# Patient Record
Sex: Male | Born: 1940 | Race: White | Hispanic: No | Marital: Married | Smoking: Former smoker
Health system: Southern US, Community
[De-identification: ages and names within clinical notes are randomized; demographics above are authoritative.]

## PROBLEM LIST (undated history)

## (undated) DIAGNOSIS — Z87442 Personal history of urinary calculi: Secondary | ICD-10-CM

## (undated) DIAGNOSIS — M25622 Stiffness of left elbow, not elsewhere classified: Secondary | ICD-10-CM

## (undated) DIAGNOSIS — I209 Angina pectoris, unspecified: Secondary | ICD-10-CM

## (undated) DIAGNOSIS — J349 Unspecified disorder of nose and nasal sinuses: Secondary | ICD-10-CM

## (undated) DIAGNOSIS — M539 Dorsopathy, unspecified: Secondary | ICD-10-CM

## (undated) DIAGNOSIS — S0291XA Unspecified fracture of skull, initial encounter for closed fracture: Secondary | ICD-10-CM

## (undated) DIAGNOSIS — R0600 Dyspnea, unspecified: Secondary | ICD-10-CM

## (undated) DIAGNOSIS — K219 Gastro-esophageal reflux disease without esophagitis: Secondary | ICD-10-CM

## (undated) DIAGNOSIS — M898X9 Other specified disorders of bone, unspecified site: Secondary | ICD-10-CM

## (undated) DIAGNOSIS — I10 Essential (primary) hypertension: Secondary | ICD-10-CM

## (undated) DIAGNOSIS — C61 Malignant neoplasm of prostate: Secondary | ICD-10-CM

## (undated) DIAGNOSIS — I509 Heart failure, unspecified: Secondary | ICD-10-CM

## (undated) DIAGNOSIS — N189 Chronic kidney disease, unspecified: Secondary | ICD-10-CM

## (undated) DIAGNOSIS — J189 Pneumonia, unspecified organism: Secondary | ICD-10-CM

## (undated) DIAGNOSIS — K759 Inflammatory liver disease, unspecified: Secondary | ICD-10-CM

## (undated) DIAGNOSIS — H9190 Unspecified hearing loss, unspecified ear: Secondary | ICD-10-CM

## (undated) DIAGNOSIS — J449 Chronic obstructive pulmonary disease, unspecified: Secondary | ICD-10-CM

## (undated) DIAGNOSIS — Z972 Presence of dental prosthetic device (complete) (partial): Secondary | ICD-10-CM

## (undated) DIAGNOSIS — W3400XA Accidental discharge from unspecified firearms or gun, initial encounter: Secondary | ICD-10-CM

## (undated) HISTORY — PX: INSERT / REPLACE / REMOVE PACEMAKER: SUR710

## (undated) HISTORY — PX: BACK SURGERY: SHX140

## (undated) HISTORY — PX: FRACTURE SURGERY: SHX138

## (undated) HISTORY — PX: OTHER SURGICAL HISTORY: SHX169

## (undated) HISTORY — DX: Heart failure, unspecified: I50.9

## (undated) HISTORY — PX: APPENDECTOMY: SHX54

---

## 1977-08-03 HISTORY — PX: BACK SURGERY: SHX140

## 2008-10-04 ENCOUNTER — Emergency Department: Payer: Self-pay | Admitting: Emergency Medicine

## 2008-10-10 ENCOUNTER — Emergency Department: Payer: Self-pay | Admitting: Emergency Medicine

## 2009-05-03 ENCOUNTER — Ambulatory Visit: Payer: Self-pay | Admitting: Orthopedic Surgery

## 2009-05-09 ENCOUNTER — Ambulatory Visit: Payer: Self-pay | Admitting: Orthopedic Surgery

## 2010-01-17 ENCOUNTER — Ambulatory Visit: Payer: Self-pay | Admitting: Cardiology

## 2010-02-06 ENCOUNTER — Ambulatory Visit: Payer: Self-pay | Admitting: Orthopedic Surgery

## 2010-02-13 ENCOUNTER — Ambulatory Visit: Payer: Self-pay | Admitting: Orthopedic Surgery

## 2010-02-17 LAB — PATHOLOGY REPORT

## 2010-09-13 ENCOUNTER — Emergency Department: Payer: Self-pay | Admitting: Internal Medicine

## 2011-01-31 ENCOUNTER — Emergency Department: Payer: Self-pay | Admitting: Unknown Physician Specialty

## 2011-02-02 ENCOUNTER — Ambulatory Visit: Payer: Self-pay | Admitting: Physician Assistant

## 2011-11-03 DIAGNOSIS — M869 Osteomyelitis, unspecified: Secondary | ICD-10-CM | POA: Insufficient documentation

## 2012-08-09 ENCOUNTER — Ambulatory Visit: Payer: Self-pay | Admitting: Pain Medicine

## 2015-12-02 ENCOUNTER — Encounter: Payer: Self-pay | Admitting: Emergency Medicine

## 2015-12-02 ENCOUNTER — Emergency Department
Admission: EM | Admit: 2015-12-02 | Discharge: 2015-12-02 | Disposition: A | Discharge status: Home / Self Care | Payer: Medicare Other | Attending: Emergency Medicine | Admitting: Emergency Medicine

## 2015-12-02 ENCOUNTER — Emergency Department: Payer: Medicare Other

## 2015-12-02 DIAGNOSIS — F17200 Nicotine dependence, unspecified, uncomplicated: Secondary | ICD-10-CM | POA: Diagnosis not present

## 2015-12-02 DIAGNOSIS — J449 Chronic obstructive pulmonary disease, unspecified: Secondary | ICD-10-CM | POA: Insufficient documentation

## 2015-12-02 DIAGNOSIS — R0789 Other chest pain: Secondary | ICD-10-CM

## 2015-12-02 HISTORY — DX: Chronic obstructive pulmonary disease, unspecified: J44.9

## 2015-12-02 LAB — BASIC METABOLIC PANEL
Anion gap: 9 (ref 5–15)
BUN: 13 mg/dL (ref 6–20)
CO2: 26 mmol/L (ref 22–32)
CREATININE: 0.7 mg/dL (ref 0.61–1.24)
Calcium: 9 mg/dL (ref 8.9–10.3)
Chloride: 98 mmol/L — ABNORMAL LOW (ref 101–111)
Glucose, Bld: 96 mg/dL (ref 65–99)
Potassium: 4.1 mmol/L (ref 3.5–5.1)
Sodium: 133 mmol/L — ABNORMAL LOW (ref 135–145)

## 2015-12-02 LAB — CBC
HCT: 41.9 % (ref 40.0–52.0)
Hemoglobin: 14.3 g/dL (ref 13.0–18.0)
MCH: 33 pg (ref 26.0–34.0)
MCHC: 34.1 g/dL (ref 32.0–36.0)
MCV: 96.8 fL (ref 80.0–100.0)
PLATELETS: 287 10*3/uL (ref 150–440)
RBC: 4.33 MIL/uL — AB (ref 4.40–5.90)
RDW: 14.2 % (ref 11.5–14.5)
WBC: 8.5 10*3/uL (ref 3.8–10.6)

## 2015-12-02 LAB — TROPONIN I

## 2015-12-02 MED ORDER — ALBUTEROL SULFATE HFA 108 (90 BASE) MCG/ACT IN AERS: INHALATION_SPRAY | RESPIRATORY_TRACT | Status: DC
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
## 2015-12-02 NOTE — ED Notes (Addendum)
Pt reports centralized chest pain that started 2 days ago; reports dullness. Pt sent here from [facility] for CP eval. Pt reports pain radiates into neck; reports pain upon inspiration.

## 2015-12-02 NOTE — Discharge Instructions (Signed)
You have been seen in the Emergency Department (ED) today for chest pain.  As we have discussed todays test results are normal, but you may require further testing.  Please follow up with the toll-free number listed below in this paperwork to help you establish a primary care doctor.  Continue taking a daily aspirin, either full dose (325 mg) or baby (81 mg).   Return to the Emergency Department (ED) if you experience any further chest pain/pressure/tightness, difficulty breathing, or sudden sweating, or other symptoms that concern you.   Chest Pain (Nonspecific) It is often hard to give a specific diagnosis for the cause of chest pain. There is always a chance that your pain could be related to something serious, such as a heart attack or a blood clot in the lungs. You need to follow up with your health care provider for further evaluation. CAUSES   Heartburn.  Pneumonia or bronchitis.  Anxiety or stress.  Inflammation around your heart (pericarditis) or lung (pleuritis or pleurisy).  A blood clot in the lung.  A collapsed lung (pneumothorax). It can develop suddenly on its own (spontaneous pneumothorax) or from trauma to the chest.  Shingles infection (herpes zoster virus). The chest wall is composed of bones, muscles, and cartilage. Any of these can be the source of the pain.  The bones can be bruised by injury.  The muscles or cartilage can be strained by coughing or overwork.  The cartilage can be affected by inflammation and become sore (costochondritis). DIAGNOSIS  Lab tests or other studies may be needed to find the cause of your pain. Your health care provider may have you take a test called an ambulatory electrocardiogram (ECG). An ECG records your heartbeat patterns over a 24-hour period. You may also have other tests, such as:  Transthoracic echocardiogram (TTE). During echocardiography, sound waves are used to evaluate how blood flows through your  heart.  Transesophageal echocardiogram (TEE).  Cardiac monitoring. This allows your health care provider to monitor your heart rate and rhythm in real time.  Holter monitor. This is a portable device that records your heartbeat and can help diagnose heart arrhythmias. It allows your health care provider to track your heart activity for several days, if needed.  Stress tests by exercise or by giving medicine that makes the heart beat faster. TREATMENT   Treatment depends on what may be causing your chest pain. Treatment may include:  Acid blockers for heartburn.  Anti-inflammatory medicine.  Pain medicine for inflammatory conditions.  Antibiotics if an infection is present.  You may be advised to change lifestyle habits. This includes stopping smoking and avoiding alcohol, caffeine, and chocolate.  You may be advised to keep your head raised (elevated) when sleeping. This reduces the chance of acid going backward from your stomach into your esophagus. Most of the time, nonspecific chest pain will improve within 2-3 days with rest and mild pain medicine.  HOME CARE INSTRUCTIONS   If antibiotics were prescribed, take them as directed. Finish them even if you start to feel better.  For the next few days, avoid physical activities that bring on chest pain. Continue physical activities as directed.  Do not use any tobacco products, including cigarettes, chewing tobacco, or electronic cigarettes.  Avoid drinking alcohol.  Only take medicine as directed by your health care provider.  Follow your health care provider's suggestions for further testing if your chest pain does not go away.  Keep any follow-up appointments you made. If you do not  go to an appointment, you could develop lasting (chronic) problems with pain. If there is any problem keeping an appointment, call to reschedule. SEEK MEDICAL CARE IF:   Your chest pain does not go away, even after treatment.  You have a rash  with blisters on your chest.  You have a fever. SEEK IMMEDIATE MEDICAL CARE IF:   You have increased chest pain or pain that spreads to your arm, neck, jaw, back, or abdomen.  You have shortness of breath.  You have an increasing cough, or you cough up blood.  You have severe back or abdominal pain.  You feel nauseous or vomit.  You have severe weakness.  You faint.  You have chills. This is an emergency. Do not wait to see if the pain will go away. Get medical help at once. Call your local emergency services (911 in [place] Do not drive yourself to the hospital. MAKE SURE YOU:   Understand these instructions.  Will watch your condition.  Will get help right away if you are not doing well or get worse. Document Released: 04/29/2005 Document Revised: 07/25/2013 Document Reviewed: 02/23/2008 [facility] Patient Information 2015 [place], [place]. This information is not intended to replace advice given to you by your health care provider. Make sure you discuss any questions you have with your health care provider.    Chronic Obstructive Pulmonary Disease Exacerbation Chronic obstructive pulmonary disease (COPD) is a common lung condition in which airflow from the lungs is limited. COPD is a general term that can be used to describe many different lung problems that limit airflow, including chronic bronchitis and emphysema. COPD exacerbations are episodes when breathing symptoms become much worse and require extra treatment. Without treatment, COPD exacerbations can be life threatening, and frequent COPD exacerbations can cause further damage to your lungs. CAUSES  Respiratory infections.  Exposure to smoke.  Exposure to air pollution, chemical fumes, or dust. Sometimes there is no apparent cause or trigger. RISK FACTORS  Smoking cigarettes.  Older age.  Frequent prior COPD exacerbations. SIGNS AND SYMPTOMS  Increased coughing.  Increased thick spit (sputum)  production.  Increased wheezing.  Increased shortness of breath.  Rapid breathing.  Chest tightness. DIAGNOSIS Your medical history, a physical exam, and tests will help your health care provider make a diagnosis. Tests may include:  A chest X-ray.  Basic lab tests.  Sputum testing.  An arterial blood gas test. TREATMENT Depending on the severity of your COPD exacerbation, you may need to be admitted to a hospital for treatment. Some of the treatments commonly used to treat COPD exacerbations are:   Antibiotic medicines.  Bronchodilators. These are drugs that expand the air passages. They may be given with an inhaler or nebulizer. Spacer devices may be needed to help improve drug delivery.  Corticosteroid medicines.  Supplemental oxygen therapy.  Airway clearing techniques, such as noninvasive ventilation (NIV) and positive expiratory pressure (PEP). These provide respiratory support through a mask or other noninvasive device. HOME CARE INSTRUCTIONS  Do not smoke. Quitting smoking is very important to prevent COPD from getting worse and exacerbations from happening as often.  Avoid exposure to all substances that irritate the airway, especially to tobacco smoke.  If you were prescribed an antibiotic medicine, finish it all even if you start to feel better.  Take all medicines as directed by your health care provider.It is important to use correct technique with inhaled medicines.  Drink enough fluids to keep your urine clear or pale yellow (unless you  have a medical condition that requires fluid restriction).  Use a cool mist vaporizer. This makes it easier to clear your chest when you cough.  If you have a home nebulizer and oxygen, continue to use them as directed.  Maintain all necessary vaccinations to prevent infections.  Exercise regularly.  Eat a healthy diet.  Keep all follow-up appointments as directed by your health care provider. SEEK IMMEDIATE  MEDICAL CARE IF:  You have worsening shortness of breath.  You have trouble talking.  You have severe chest pain.  You have blood in your sputum.  You have a fever.  You have weakness, vomit repeatedly, or faint.  You feel confused.  You continue to get worse. MAKE SURE YOU:  Understand these instructions.  Will watch your condition.  Will get help right away if you are not doing well or get worse.   This information is not intended to replace advice given to you by your health care provider. Make sure you discuss any questions you have with your health care provider.   Document Released: 05/17/2007 Document Revised: 08/10/2014 Document Reviewed: 03/24/2013 Elsevier Interactive Patient Education [employer].

## 2019-08-04 DIAGNOSIS — I219 Acute myocardial infarction, unspecified: Secondary | ICD-10-CM

## 2019-08-04 HISTORY — DX: Acute myocardial infarction, unspecified: I21.9

## 2019-08-14 ENCOUNTER — Emergency Department: Payer: Medicare Other

## 2019-08-14 ENCOUNTER — Other Ambulatory Visit: Payer: Self-pay

## 2019-08-14 ENCOUNTER — Inpatient Hospital Stay
Admission: EM | Admit: 2019-08-14 | Discharge: 2019-08-17 | DRG: 228 | Disposition: A | Discharge status: Home / Self Care | Payer: Medicare Other | Attending: Internal Medicine | Admitting: Internal Medicine

## 2019-08-14 DIAGNOSIS — E440 Moderate protein-calorie malnutrition: Secondary | ICD-10-CM

## 2019-08-14 DIAGNOSIS — Z888 Allergy status to other drugs, medicaments and biological substances status: Secondary | ICD-10-CM | POA: Diagnosis not present

## 2019-08-14 DIAGNOSIS — J441 Chronic obstructive pulmonary disease with (acute) exacerbation: Secondary | ICD-10-CM

## 2019-08-14 DIAGNOSIS — R0600 Dyspnea, unspecified: Secondary | ICD-10-CM

## 2019-08-14 DIAGNOSIS — R079 Chest pain, unspecified: Secondary | ICD-10-CM | POA: Diagnosis not present

## 2019-08-14 DIAGNOSIS — D72829 Elevated white blood cell count, unspecified: Secondary | ICD-10-CM | POA: Diagnosis present

## 2019-08-14 DIAGNOSIS — Z681 Body mass index (BMI) 19 or less, adult: Secondary | ICD-10-CM | POA: Diagnosis not present

## 2019-08-14 DIAGNOSIS — E43 Unspecified severe protein-calorie malnutrition: Secondary | ICD-10-CM | POA: Insufficient documentation

## 2019-08-14 DIAGNOSIS — F17200 Nicotine dependence, unspecified, uncomplicated: Secondary | ICD-10-CM

## 2019-08-14 DIAGNOSIS — Z716 Tobacco abuse counseling: Secondary | ICD-10-CM | POA: Diagnosis not present

## 2019-08-14 DIAGNOSIS — Z79899 Other long term (current) drug therapy: Secondary | ICD-10-CM | POA: Diagnosis not present

## 2019-08-14 DIAGNOSIS — R739 Hyperglycemia, unspecified: Secondary | ICD-10-CM | POA: Diagnosis not present

## 2019-08-14 DIAGNOSIS — F17210 Nicotine dependence, cigarettes, uncomplicated: Secondary | ICD-10-CM | POA: Diagnosis present

## 2019-08-14 DIAGNOSIS — T380X5A Adverse effect of glucocorticoids and synthetic analogues, initial encounter: Secondary | ICD-10-CM | POA: Diagnosis not present

## 2019-08-14 DIAGNOSIS — I442 Atrioventricular block, complete: Secondary | ICD-10-CM

## 2019-08-14 DIAGNOSIS — Z20822 Contact with and (suspected) exposure to covid-19: Secondary | ICD-10-CM | POA: Diagnosis present

## 2019-08-14 DIAGNOSIS — N179 Acute kidney failure, unspecified: Secondary | ICD-10-CM | POA: Diagnosis present

## 2019-08-14 LAB — CBC
HCT: 36.4 % — ABNORMAL LOW (ref 39.0–52.0)
Hemoglobin: 12.2 g/dL — ABNORMAL LOW (ref 13.0–17.0)
MCH: 33 pg (ref 26.0–34.0)
MCHC: 33.5 g/dL (ref 30.0–36.0)
MCV: 98.4 fL (ref 80.0–100.0)
Platelets: 218 10*3/uL (ref 150–400)
RBC: 3.7 MIL/uL — ABNORMAL LOW (ref 4.22–5.81)
RDW: 13.7 % (ref 11.5–15.5)
WBC: 9.3 10*3/uL (ref 4.0–10.5)
nRBC: 0 % (ref 0.0–0.2)

## 2019-08-14 LAB — BASIC METABOLIC PANEL
Anion gap: 6 (ref 5–15)
BUN: 12 mg/dL (ref 8–23)
CO2: 28 mmol/L (ref 22–32)
Calcium: 8.7 mg/dL — ABNORMAL LOW (ref 8.9–10.3)
Chloride: 102 mmol/L (ref 98–111)
Creatinine, Ser: 0.77 mg/dL (ref 0.61–1.24)
GFR calc Af Amer: >60 mL/min (ref >60)
GFR calc non Af Amer: >60 mL/min (ref >60)
Glucose, Bld: 82 mg/dL (ref 70–99)
Potassium: 4.2 mmol/L (ref 3.5–5.1)
Sodium: 136 mmol/L (ref 135–145)

## 2019-08-14 LAB — MAGNESIUM: Magnesium: 2 mg/dL (ref 1.7–2.4)

## 2019-08-14 LAB — POC SARS CORONAVIRUS 2 AG: SARS Coronavirus 2 Ag: NEGATIVE

## 2019-08-14 LAB — RESPIRATORY PANEL BY RT PCR (FLU A&B, COVID)
Influenza A by PCR: NEGATIVE
Influenza B by PCR: NEGATIVE
SARS Coronavirus 2 by RT PCR: NEGATIVE

## 2019-08-14 LAB — TROPONIN I (HIGH SENSITIVITY)
Troponin I (High Sensitivity): 14 ng/L (ref <18)
Troponin I (High Sensitivity): 17 ng/L (ref <18)

## 2019-08-14 MED ORDER — ENOXAPARIN SODIUM 40 MG/0.4ML ~~LOC~~ SOLN: 40.0000 mg | SUBCUTANEOUS | Status: DC

## 2019-08-14 MED ORDER — AZITHROMYCIN 500 MG PO TABS
500.0000 mg | ORAL_TABLET | Freq: Every day | ORAL | Status: DC
  Administered 2019-08-15 – 2019-08-16 (×2): 500 mg via ORAL
  Filled 2019-08-14 (×4): qty 1

## 2019-08-14 MED ORDER — ALBUTEROL SULFATE (2.5 MG/3ML) 0.083% IN NEBU: 2.5000 mg | INHALATION_SOLUTION | RESPIRATORY_TRACT | Status: DC | PRN

## 2019-08-14 MED ORDER — PREDNISONE 10 MG PO TABS
40.0000 mg | ORAL_TABLET | Freq: Every day | ORAL | Status: DC
  Administered 2019-08-17: 40 mg via ORAL
  Filled 2019-08-14 (×2): qty 4

## 2019-08-14 MED ORDER — METHYLPREDNISOLONE SODIUM SUCC 40 MG IJ SOLR
40.0000 mg | Freq: Two times a day (BID) | INTRAMUSCULAR | Status: AC
  Administered 2019-08-15 (×2): 40 mg via INTRAVENOUS
  Filled 2019-08-14 (×2): qty 1

## 2019-08-14 MED ORDER — IPRATROPIUM-ALBUTEROL 0.5-2.5 (3) MG/3ML IN SOLN: 3.0000 mL | Freq: Once | RESPIRATORY_TRACT | Status: AC

## 2019-08-14 MED ORDER — IPRATROPIUM-ALBUTEROL 0.5-2.5 (3) MG/3ML IN SOLN
3.0000 mL | Freq: Once | RESPIRATORY_TRACT | Status: AC
  Administered 2019-08-14: 20:00:00 3 mL via RESPIRATORY_TRACT

## 2019-08-14 MED ORDER — IPRATROPIUM-ALBUTEROL 0.5-2.5 (3) MG/3ML IN SOLN
RESPIRATORY_TRACT | Status: AC
  Administered 2019-08-14: 19:00:00 3 mL via RESPIRATORY_TRACT
  Filled 2019-08-14: qty 6

## 2019-08-14 MED ORDER — NICOTINE 21 MG/24HR TD PT24
21.0000 mg | MEDICATED_PATCH | Freq: Every day | TRANSDERMAL | Status: DC
  Filled 2019-08-14: qty 1

## 2019-08-14 MED ORDER — METHYLPREDNISOLONE SODIUM SUCC 125 MG IJ SOLR
125.0000 mg | Freq: Once | INTRAMUSCULAR | Status: AC
  Administered 2019-08-14: 19:00:00 125 mg via INTRAVENOUS
  Filled 2019-08-14: qty 2

## 2019-08-14 MED ORDER — IPRATROPIUM-ALBUTEROL 0.5-2.5 (3) MG/3ML IN SOLN
3.0000 mL | Freq: Four times a day (QID) | RESPIRATORY_TRACT | Status: DC
  Administered 2019-08-14 – 2019-08-17 (×9): 3 mL via RESPIRATORY_TRACT
  Filled 2019-08-14 (×9): qty 3

## 2019-08-14 MED ORDER — SODIUM CHLORIDE 0.9 % IV SOLN
500.0000 mg | INTRAVENOUS | Status: AC
  Administered 2019-08-14: 500 mg via INTRAVENOUS
  Filled 2019-08-14: qty 500

## 2019-08-14 NOTE — ED Notes (Signed)
Zoll pads placed on patient and code cart placed at bedside per Day.

## 2019-08-14 NOTE — ED Provider Notes (Signed)
[facility] Emergency Department Provider Note  Time seen: 6:44 PM  I have reviewed the triage vital signs and the nursing notes.   HISTORY  Chief Complaint Shortness of Breath   HPI Todd Webster is a 79 y.o. male with a past medical history of COPD presents to the emergency department for shortness of breath.  Patient states he has been diagnosed with COPD but takes no nebulizer treatments, and does not use oxygen at home, continues to smoke.  Patient states last night while walking to the bathroom he became short of breath and had a mild pain in his chest.  States he has continued to feel somewhat short of breath today feeling like he needs to take deep breaths to catch his breath.  Denies any chest pain at this time.  Denies any fever increased cough or congestion.  No Covid exposure.  Overall the patient appears quite well currently, no acute distress, 99 to 100% room air saturation.  Speaking in full and complete sentences.   Past Medical History:  Diagnosis Date  . COPD (chronic obstructive pulmonary disease) (HCC)     There are no problems to display for this patient.   Past Surgical History:  Procedure Laterality Date  . APPENDECTOMY      Prior to Admission medications   Medication Sig Start Date End Date Taking? Authorizing Provider  albuterol (PROVENTIL HFA;VENTOLIN HFA) 108 (90 Base) MCG/ACT inhaler Inhale 4-6 puffs by mouth every 4 hours as needed for wheezing, cough, and/or shortness of breath 12/02/15   Hinda Kehr, MD    Allergies  Allergen Reactions  . Grifulvin V [Griseofulvin]     No family history on file.  Social History Social History   Tobacco Use  . Smoking status: Current Every Day Smoker  Substance Use Topics  . Alcohol use: Not on file  . Drug use: Not on file    Review of Systems Constitutional: Negative for fever. ENT: Negative for recent illness/congestion Cardiovascular: Mild chest pain last night which has  since resolved. Respiratory: Positive for shortness of breath. Gastrointestinal: Negative for abdominal pain, vomiting  Musculoskeletal: Negative for leg swelling. Neurological: Negative for headache All other ROS negative  ____________________________________________   PHYSICAL EXAM:  VITAL SIGNS: ED Triage Vitals [08/14/19 1628]  Enc Vitals Group     BP (!) 124/57     Pulse Rate (!) 43     Resp 20     Temp 98.1 F (36.7 C)     Temp Source Oral     SpO2 100 %     Weight 130 lb (59 kg)     Height 6\' 1"  (1.854 m)     Head Circumference      Peak Flow      Pain Score 0     Pain Loc      Pain Edu?      Excl. in [place]?    Constitutional: Alert and oriented. Well appearing and in no distress. Eyes: Normal exam ENT      Head: Normocephalic and atraumatic      Mouth/Throat: Mucous membranes are moist. Cardiovascular: Normal rate, regular rhythm. No murmur Respiratory: Normal respiratory effort without tachypnea nor retractions.  Mild expiratory wheeze bilaterally.  No rales or rhonchi. Gastrointestinal: Soft and nontender. No distention. Musculoskeletal: Nontender with normal range of motion in all extremities. No lower extremity tenderness or edema. Neurologic:  Normal speech and language. No gross focal neurologic deficits Skin:  Skin is warm, dry  and intact.  Psychiatric: Mood and affect are normal.   ____________________________________________    EKG  EKG viewed and interpreted by myself shows sinus bradycardia 76 bpm with a narrow QRS, normal axis, normal intervals, nonspecific ST changes.  No ST elevations.  Does appear to have additional P waves versus U waves.  ____________________________________________    RADIOLOGY  X-ray consistent with COPD no acute infiltrates.  ____________________________________________   INITIAL IMPRESSION / ASSESSMENT AND PLAN / ED COURSE  Pertinent labs & imaging results that were available during my care of the patient were  reviewed by me and considered in my medical decision making (see chart for details).   Patient presents emergency department for shortness of breath, worse last night.  Currently appears well satting 1% on room air speaking full and complete sentences.  No chest pain currently.  Patient's labs thus far reassuring including a negative troponin.  We will repeat a troponin as a precaution.  We will treat with Solu-Medrol.  We will perform a rapid Covid swab.  If the patient's Covid test is negative we will treat with duo nebs in the emergency department.  Patient's presentation is suspicious for COPD exacerbation.  Lab work is normal including negative troponin.  Covid test is negative.  Chest x-ray shows COPD but no acute infiltrates.  However patient's heart rate is now sustained around 42 bpm.  I printed a rhythm strip and it appears that the patient is in complete heart block, P waves are not correlating with QRS.  This would explain the patient's shortness of breath especially with any type of exertion.  We will repeat an EKG and I will discussed with cardiology for further recommendations.  Patient will likely require admission.  Spoke with cardiology we will admit the patient to the hospital service.  Cardiology will see.  We will place the patient on zoll monitor as a precaution but does not require external pacing at this time.  Todd Webster was evaluated in Emergency Department on 08/14/2019 for the symptoms described in the history of present illness. He was evaluated in the context of the global COVID-19 pandemic, which necessitated consideration that the patient might be at risk for infection with the SARS-CoV-2 virus that causes COVID-19. Institutional protocols and algorithms that pertain to the evaluation of patients at risk for COVID-19 are in a state of rapid change based on information released by regulatory bodies including the CDC and federal and state organizations. These policies and  algorithms were followed during the patient's care in the ED.  ____________________________________________   FINAL CLINICAL IMPRESSION(S) / ED DIAGNOSES  Complete heart block Dyspnea COPD exacerbation   Harvest Dark, MD 08/14/19 2011

## 2019-08-14 NOTE — ED Notes (Signed)
While ambulating pt had increased WOB but oxygen saturation did not drop below 94% with a good waveform on RA. Pt needed to sit in the bed for a minute before WOB returned to normal and pt could speak in complete sentences.

## 2019-08-14 NOTE — ED Notes (Signed)
ED Provider at bedside. 

## 2019-08-14 NOTE — ED Notes (Signed)
Pt given phone to speak with family  
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
## 2019-08-14 NOTE — ED Triage Notes (Signed)
Pt to ER via POV c/o increase in SOB over his baseline beginning last night. Pt hx of COPD but states SOB worsening progressively. Does not wear oxygen at home. Soreness to chest that lasted a few minutes last night, no current CP. Pt alert and oriented X4, cooperative, RR even and unlabored, color WNL. Pt in NAD. Able to speak in full and complete sentences without difficulty.

## 2019-08-14 NOTE — Consult Note (Signed)
Chart reviewed and EKG, patient hemodynamicaly stable, will hold enoxazprin am , for prceedure.

## 2019-08-15 ENCOUNTER — Inpatient Hospital Stay
Admit: 2019-08-15 | Discharge: 2019-08-15 | Disposition: A | Discharge status: Home / Self Care | Payer: Medicare Other | Attending: Internal Medicine | Admitting: Internal Medicine

## 2019-08-15 DIAGNOSIS — E440 Moderate protein-calorie malnutrition: Secondary | ICD-10-CM

## 2019-08-15 DIAGNOSIS — E43 Unspecified severe protein-calorie malnutrition: Secondary | ICD-10-CM | POA: Insufficient documentation

## 2019-08-15 DIAGNOSIS — E8809 Other disorders of plasma-protein metabolism, not elsewhere classified: Secondary | ICD-10-CM | POA: Insufficient documentation

## 2019-08-15 LAB — ECHOCARDIOGRAM COMPLETE
Height: 73 in
Weight: 2080 oz

## 2019-08-15 LAB — BASIC METABOLIC PANEL
Anion gap: 10 (ref 5–15)
BUN: 16 mg/dL (ref 8–23)
CO2: 22 mmol/L (ref 22–32)
Calcium: 8.9 mg/dL (ref 8.9–10.3)
Chloride: 101 mmol/L (ref 98–111)
Creatinine, Ser: 0.97 mg/dL (ref 0.61–1.24)
GFR calc Af Amer: >60 mL/min (ref >60)
GFR calc non Af Amer: >60 mL/min (ref >60)
Glucose, Bld: 215 mg/dL — ABNORMAL HIGH (ref 70–99)
Potassium: 3.8 mmol/L (ref 3.5–5.1)
Sodium: 133 mmol/L — ABNORMAL LOW (ref 135–145)

## 2019-08-15 LAB — GLUCOSE, CAPILLARY: Glucose-Capillary: 194 mg/dL — ABNORMAL HIGH (ref 70–99)

## 2019-08-15 LAB — CBC
HCT: 33.9 % — ABNORMAL LOW (ref 39.0–52.0)
Hemoglobin: 11.3 g/dL — ABNORMAL LOW (ref 13.0–17.0)
MCH: 32.6 pg (ref 26.0–34.0)
MCHC: 33.3 g/dL (ref 30.0–36.0)
MCV: 97.7 fL (ref 80.0–100.0)
Platelets: 189 10*3/uL (ref 150–400)
RBC: 3.47 MIL/uL — ABNORMAL LOW (ref 4.22–5.81)
RDW: 13.8 % (ref 11.5–15.5)
WBC: 5.2 10*3/uL (ref 4.0–10.5)
nRBC: 0 % (ref 0.0–0.2)

## 2019-08-15 LAB — TSH: TSH: 1.568 u[IU]/mL (ref 0.350–4.500)

## 2019-08-15 LAB — HIV ANTIBODY (ROUTINE TESTING W REFLEX): HIV Screen 4th Generation wRfx: NONREACTIVE

## 2019-08-15 LAB — MRSA PCR SCREENING: MRSA by PCR: NEGATIVE

## 2019-08-15 LAB — MAGNESIUM: Magnesium: 1.9 mg/dL (ref 1.7–2.4)

## 2019-08-15 MED ORDER — ADULT MULTIVITAMIN W/MINERALS CH
1.0000 | ORAL_TABLET | Freq: Every day | ORAL | Status: DC
  Administered 2019-08-17: 1 via ORAL
  Filled 2019-08-15: qty 1

## 2019-08-15 MED ORDER — ACETAMINOPHEN 325 MG PO TABS: 325.0000 mg | ORAL_TABLET | ORAL | Status: DC | PRN

## 2019-08-15 MED ORDER — ACETAMINOPHEN 325 MG PO TABS
ORAL_TABLET | ORAL | Status: AC
  Filled 2019-08-15: qty 2

## 2019-08-15 MED ORDER — CHLORHEXIDINE GLUCONATE CLOTH 2 % EX PADS
6.0000 | MEDICATED_PAD | Freq: Every day | CUTANEOUS | Status: DC
  Administered 2019-08-15 – 2019-08-16 (×2): 6 via TOPICAL
  Filled 2019-08-15: qty 6

## 2019-08-15 MED ORDER — SENNOSIDES-DOCUSATE SODIUM 8.6-50 MG PO TABS: 2.0000 | ORAL_TABLET | Freq: Every evening | ORAL | Status: DC | PRN

## 2019-08-15 MED ORDER — BUDESONIDE 0.5 MG/2ML IN SUSP
0.5000 mg | Freq: Two times a day (BID) | RESPIRATORY_TRACT | Status: DC
  Administered 2019-08-15 – 2019-08-17 (×4): 0.5 mg via RESPIRATORY_TRACT
  Filled 2019-08-15 (×4): qty 2

## 2019-08-15 MED ORDER — ENSURE ENLIVE PO LIQD
237.0000 mL | Freq: Three times a day (TID) | ORAL | Status: DC
  Administered 2019-08-15 – 2019-08-17 (×4): 237 mL via ORAL

## 2019-08-15 MED ORDER — ACETAMINOPHEN 325 MG PO TABS
650.0000 mg | ORAL_TABLET | Freq: Four times a day (QID) | ORAL | Status: DC | PRN
  Administered 2019-08-15 (×2): 650 mg via ORAL
  Filled 2019-08-15: qty 2

## 2019-08-15 MED ORDER — POLYETHYLENE GLYCOL 3350 17 G PO PACK: 17.0000 g | PACK | Freq: Every day | ORAL | Status: DC | PRN
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
## 2019-08-15 NOTE — Progress Notes (Signed)
PT Cancellation Note  Patient Details Name: Todd NITCHMAN Sr. MRN: [id number] DOB: 11-16-1940   Cancelled Treatment:    Reason Eval/Treat Not Completed: Patient not medically ready;Medical issues which prohibited therapy. PT consult received and chart reviewed. Upon review and speaking with RN, pt with HR of 35 bpm this AM, pt to have PPM placed today. Will hold PT evaluation for later date/time if appropriate. Thank you.  Zachary George PT, [place] 9:24 AM,08/15/19 [phone]    Tamey Wanek Drucilla Chalet 08/15/2019, 9:24 AM

## 2019-08-15 NOTE — Progress Notes (Signed)
OT Cancellation Note  Patient Details Name: Todd SALZMAN Sr. MRN: [id number] DOB: 1941/03/28   Cancelled Treatment:    Reason Eval/Treat Not Completed: Medical issues which prohibited therapy  OT consult received and chart reviewed. Upon review and speaking with RN, pt with HR of 35 bpm this AM (0800 most recent reading), pt to have PPM placed today. Will hold occupational therapy evaluation until after procedure if appropriate. Thank you.   Gerrianne Scale, [place], OTR/L ascom [phone] 08/15/19, 9:20 AM

## 2019-08-15 NOTE — Progress Notes (Signed)
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  GOAL:   Patient will meet greater than or equal to 90% of their needs  MONITOR:   PO intake, Supplement acceptance, Labs, Weight trends, Skin, I & O's  REASON FOR ASSESSMENT:   Consult Assessment of nutrition requirement/status  ASSESSMENT:   79 y/o male with h/o COPD admitted with chest pain secondary to third-degree heart block   Met with pt in room today. Pt reports good appetite and oral intake at baseline. Pt reports that lunch today was the first meal he was offered since he was admitted. Pt ate a sandwich and some milk (likes whole). Pt reports that he loves milkshakes and he drinks these a lot at home. Pt does not drink supplements but is willing to try Ensure while in hospital. RD will add supplements and MVI to help pt meet his estimated needs. RD will also change pt to a dysphagia 3 diet as pt with poor dentition. There is not a very detailed weight history in chart to determine if any significant recent weight loss. Pt has one documented weight from 2019 of 139lbs. Pt reports that his weight is stable.   Medications reviewed and include: azithromycin, solu-medrol, nicotine  Labs reviewed: Na 133(L), Mg 1.9 wnl  NUTRITION - FOCUSED PHYSICAL EXAM:    Most Recent Value  Orbital Region  Moderate depletion  Upper Arm Region  Severe depletion  Thoracic and Lumbar Region  Severe depletion  Buccal Region  Moderate depletion  Temple Region  Severe depletion  Clavicle Bone Region  Severe depletion  Clavicle and  Acromion Bone Region  Severe depletion  Scapular Bone Region  Severe depletion  Dorsal Hand  Severe depletion  Patellar Region  Severe depletion  Anterior Thigh Region  Severe depletion  Posterior Calf Region  Severe depletion  Edema (RD Assessment)  None  Hair  Reviewed  Eyes  Reviewed  Mouth  Reviewed  Skin  Reviewed  Nails  Reviewed     Diet Order:   Diet Order            DIET DYS 3 Room service appropriate? Yes; Fluid consistency: Thin  Diet effective now             EDUCATION NEEDS:   Education needs have been addressed  Skin:  Skin Assessment: Reviewed RN Assessment  Last BM:  pta  Height:   Ht Readings from Last 1 Encounters:  08/14/19 '6\' 1"'  (1.854 m)    Weight:   Wt Readings from Last 1 Encounters:  08/14/19 59 kg    Ideal Body Weight:  83.6 kg  BMI:  Body mass index is 17.15 kg/m.  Estimated Nutritional Needs:   Kcal:  1800-2100kcal/day  Protein:  90-105g/day  Fluid:  >1.5L/day  Koleen Distance MS, RD, LDN Pager #- [phone] Office#- [phone] After Hours Pager: [phone]
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
## 2019-08-15 NOTE — Progress Notes (Signed)
*  PRELIMINARY RESULTS* Echocardiogram 2D Echocardiogram has been performed.  Todd [place] 08/15/2019, 11:49 AM

## 2019-08-15 NOTE — Progress Notes (Signed)
I spoke to Dr. Raliegh Scarlet, and he has planned permanent pacemaker implantation tomorrow.  Patient should be kept n.p.o. tonight for pacemaker implantation in a.m.

## 2019-08-16 ENCOUNTER — Encounter
Admission: EM | Disposition: A | Discharge status: Home / Self Care | Payer: Self-pay | Source: Home / Self Care | Attending: Internal Medicine

## 2019-08-16 DIAGNOSIS — R079 Chest pain, unspecified: Secondary | ICD-10-CM

## 2019-08-16 DIAGNOSIS — Z950 Presence of cardiac pacemaker: Secondary | ICD-10-CM

## 2019-08-16 HISTORY — DX: Presence of cardiac pacemaker: Z95.0

## 2019-08-16 HISTORY — PX: PACEMAKER LEADLESS INSERTION: EP1219

## 2019-08-16 LAB — COMPREHENSIVE METABOLIC PANEL
ALT: 26 U/L (ref 0–44)
AST: 37 U/L (ref 15–41)
Albumin: 3.6 g/dL (ref 3.5–5.0)
Alkaline Phosphatase: 78 U/L (ref 38–126)
Anion gap: 12 (ref 5–15)
BUN: 44 mg/dL — ABNORMAL HIGH (ref 8–23)
CO2: 22 mmol/L (ref 22–32)
Calcium: 9.7 mg/dL (ref 8.9–10.3)
Chloride: 101 mmol/L (ref 98–111)
Creatinine, Ser: 1.27 mg/dL — ABNORMAL HIGH (ref 0.61–1.24)
GFR calc Af Amer: >60 mL/min (ref >60)
GFR calc non Af Amer: 54 mL/min — ABNORMAL LOW (ref >60)
Glucose, Bld: 162 mg/dL — ABNORMAL HIGH (ref 70–99)
Potassium: 4.9 mmol/L (ref 3.5–5.1)
Sodium: 135 mmol/L (ref 135–145)
Total Bilirubin: 0.6 mg/dL (ref 0.3–1.2)
Total Protein: 6.8 g/dL (ref 6.5–8.1)

## 2019-08-16 LAB — CBC
HCT: 35.3 % — ABNORMAL LOW (ref 39.0–52.0)
Hemoglobin: 12.2 g/dL — ABNORMAL LOW (ref 13.0–17.0)
MCH: 33.5 pg (ref 26.0–34.0)
MCHC: 34.6 g/dL (ref 30.0–36.0)
MCV: 97 fL (ref 80.0–100.0)
Platelets: 228 10*3/uL (ref 150–400)
RBC: 3.64 MIL/uL — ABNORMAL LOW (ref 4.22–5.81)
RDW: 14 % (ref 11.5–15.5)
WBC: 18.6 10*3/uL — ABNORMAL HIGH (ref 4.0–10.5)
nRBC: 0 % (ref 0.0–0.2)

## 2019-08-16 LAB — MAGNESIUM: Magnesium: 2.3 mg/dL (ref 1.7–2.4)

## 2019-08-16 SURGERY — PACEMAKER LEADLESS INSERTION
Anesthesia: Moderate Sedation

## 2019-08-16 MED ORDER — SODIUM CHLORIDE 0.9 % IV SOLN: INTRAVENOUS | Status: DC

## 2019-08-16 MED ORDER — MIDAZOLAM HCL 2 MG/2ML IJ SOLN
INTRAMUSCULAR | Status: AC
  Filled 2019-08-16: qty 2

## 2019-08-16 MED ORDER — SODIUM CHLORIDE 0.9% FLUSH: 3.0000 mL | INTRAVENOUS | Status: DC | PRN

## 2019-08-16 MED ORDER — MIDAZOLAM HCL 2 MG/2ML IJ SOLN
INTRAMUSCULAR | Status: DC | PRN
  Administered 2019-08-16: 1 mg via INTRAVENOUS

## 2019-08-16 MED ORDER — SODIUM CHLORIDE 0.9% FLUSH
3.0000 mL | Freq: Two times a day (BID) | INTRAVENOUS | Status: DC
  Administered 2019-08-16 – 2019-08-17 (×2): 3 mL via INTRAVENOUS

## 2019-08-16 MED ORDER — HEPARIN (PORCINE) IN NACL 2000-0.9 UNIT/L-% IV SOLN
INTRAVENOUS | Status: DC | PRN
  Administered 2019-08-16: 1000 mL

## 2019-08-16 MED ORDER — FENTANYL CITRATE (PF) 100 MCG/2ML IJ SOLN
INTRAMUSCULAR | Status: AC
  Filled 2019-08-16: qty 2

## 2019-08-16 MED ORDER — SODIUM CHLORIDE 0.45 % IV SOLN: INTRAVENOUS | Status: DC

## 2019-08-16 MED ORDER — IOHEXOL 300 MG/ML  SOLN
INTRAMUSCULAR | Status: DC | PRN
  Administered 2019-08-16: 14:00:00 15 mL

## 2019-08-16 MED ORDER — FENTANYL CITRATE (PF) 100 MCG/2ML IJ SOLN
INTRAMUSCULAR | Status: DC | PRN
  Administered 2019-08-16: 25 ug via INTRAVENOUS

## 2019-08-16 MED ORDER — SODIUM CHLORIDE 0.9 % IV SOLN: 250.0000 mL | INTRAVENOUS | Status: DC | PRN

## 2019-08-16 MED ORDER — CEFAZOLIN SODIUM-DEXTROSE 2-4 GM/100ML-% IV SOLN: 2.0000 g | INTRAVENOUS | Status: DC

## 2019-08-16 MED ORDER — HEPARIN SODIUM (PORCINE) 1000 UNIT/ML IJ SOLN
INTRAMUSCULAR | Status: AC
  Filled 2019-08-16: qty 1

## 2019-08-16 MED ORDER — ACETAMINOPHEN 325 MG PO TABS: 650.0000 mg | ORAL_TABLET | ORAL | Status: DC | PRN

## 2019-08-16 MED ORDER — LATANOPROST 0.005 % OP SOLN
1.0000 [drp] | Freq: Every day | OPHTHALMIC | Status: DC
  Administered 2019-08-16: 1 [drp] via OPHTHALMIC
  Filled 2019-08-16: qty 2.5

## 2019-08-16 MED ORDER — HEPARIN SODIUM (PORCINE) 1000 UNIT/ML IJ SOLN
INTRAMUSCULAR | Status: DC | PRN
  Administered 2019-08-16: 3500 [IU] via INTRAVENOUS

## 2019-08-16 MED ORDER — ONDANSETRON HCL 4 MG/2ML IJ SOLN: 4.0000 mg | Freq: Four times a day (QID) | INTRAMUSCULAR | Status: DC | PRN

## 2019-08-16 SURGICAL SUPPLY — 15 items
CANNULA 5F STIFF (CANNULA) ×3 IMPLANT
DILATOR VESSEL 10FR 20CM (INTRODUCER) ×3 IMPLANT
DILATOR VESSEL 38 20CM 14FR (INTRODUCER) ×3 IMPLANT
DILATOR VESSEL 38 20CM 18FR (INTRODUCER) ×3 IMPLANT
DILATOR VESSEL 38 20CM 8FR (INTRODUCER) ×3 IMPLANT
GUIDEWIRE SUPER STIFF .035X180 (WIRE) ×3 IMPLANT
MICRA AV TRANSCATH PACING SYS (Pacemaker) ×3 IMPLANT
MICRA INTRODUCER SHEATH (SHEATH) ×3
NEEDLE PERC 18GX7CM (NEEDLE) ×3 IMPLANT
SHEATH AVANTI 6FR X 11CM (SHEATH) ×3 IMPLANT
SHEATH AVANTI 7FRX11 (SHEATH) ×3 IMPLANT
SHEATH INTRODUCER MICRA (SHEATH) ×1 IMPLANT
SYSTEM PACING TRNSCTH AV MICRA (Pacemaker) ×1 IMPLANT
WIRE GUIDERIGHT .035X150 (WIRE) ×3 IMPLANT
WIRE PACING TEMP ST TIP 5 (CATHETERS) ×3 IMPLANT

## 2019-08-16 NOTE — Progress Notes (Signed)
PT Cancellation Note  Patient Details Name: Todd TIMMERMANN Sr. MRN: [id number] DOB: 12/02/1940   Cancelled Treatment:    Reason Eval/Treat Not Completed: Patient not medically ready;Medical issues which prohibited therapy. Upon chart review this AM and upon speaking with RN, pt's PPM placement planned for today @1230 . Pt's most recent recorded HR was 36 bpm (0800). Will f/u as able for PT evaluation after PPM received/pt becomes more appropriate for exertional tasks. Thank you.  Todd [place] PT, DPT 10:02 AM,08/16/19 [phone]   [name] Drucilla Chalet 08/16/2019, 10:02 AM

## 2019-08-16 NOTE — Progress Notes (Signed)
OT Cancellation Note  Patient Details Name: Todd LOCKLER Sr. MRN: [id number] DOB: [birth year]   Cancelled Treatment:    Reason Eval/Treat Not Completed: Medical issues which prohibited therapy  Upon chart review this AM and upon speaking with RN, pt's PPM placement planned for today @1230 . Pt's most recent recorded HR was 36 bpm (0800). Will f/u as able for OT evaluation after PPM received/pt becomes more appropriate for exertional tasks. Thank you.   Gerrianne Scale, [place], OTR/L ascom [phone] 08/16/19, 9:52 AM
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
## 2019-08-17 ENCOUNTER — Encounter: Payer: Self-pay | Admitting: Cardiology

## 2019-08-17 ENCOUNTER — Other Ambulatory Visit: Payer: Self-pay

## 2019-08-17 LAB — COMPREHENSIVE METABOLIC PANEL
ALT: 40 U/L (ref 0–44)
AST: 39 U/L (ref 15–41)
Albumin: 3.2 g/dL — ABNORMAL LOW (ref 3.5–5.0)
Alkaline Phosphatase: 75 U/L (ref 38–126)
Anion gap: 7 (ref 5–15)
BUN: 38 mg/dL — ABNORMAL HIGH (ref 8–23)
CO2: 27 mmol/L (ref 22–32)
Calcium: 8.5 mg/dL — ABNORMAL LOW (ref 8.9–10.3)
Chloride: 104 mmol/L (ref 98–111)
Creatinine, Ser: 0.98 mg/dL (ref 0.61–1.24)
GFR calc Af Amer: >60 mL/min (ref >60)
GFR calc non Af Amer: >60 mL/min (ref >60)
Glucose, Bld: 109 mg/dL — ABNORMAL HIGH (ref 70–99)
Potassium: 4.5 mmol/L (ref 3.5–5.1)
Sodium: 138 mmol/L (ref 135–145)
Total Bilirubin: 0.7 mg/dL (ref 0.3–1.2)
Total Protein: 6.1 g/dL — ABNORMAL LOW (ref 6.5–8.1)

## 2019-08-17 LAB — CBC
HCT: 32.6 % — ABNORMAL LOW (ref 39.0–52.0)
Hemoglobin: 11.2 g/dL — ABNORMAL LOW (ref 13.0–17.0)
MCH: 33.3 pg (ref 26.0–34.0)
MCHC: 34.4 g/dL (ref 30.0–36.0)
MCV: 97 fL (ref 80.0–100.0)
Platelets: 197 10*3/uL (ref 150–400)
RBC: 3.36 MIL/uL — ABNORMAL LOW (ref 4.22–5.81)
RDW: 14.4 % (ref 11.5–15.5)
WBC: 14.7 10*3/uL — ABNORMAL HIGH (ref 4.0–10.5)
nRBC: 0 % (ref 0.0–0.2)

## 2019-08-17 LAB — MAGNESIUM: Magnesium: 2.2 mg/dL (ref 1.7–2.4)

## 2019-08-17 MED ORDER — ACETAMINOPHEN 325 MG PO TABS: 650.0000 mg | ORAL_TABLET | ORAL | Status: DC | PRN

## 2019-08-17 MED ORDER — IPRATROPIUM-ALBUTEROL 20-100 MCG/ACT IN AERS: 1.0000 | INHALATION_SPRAY | Freq: Four times a day (QID) | RESPIRATORY_TRACT | 0 refills | Status: DC

## 2019-08-17 MED ORDER — PREDNISONE 20 MG PO TABS: 40.0000 mg | ORAL_TABLET | Freq: Every day | ORAL | 0 refills | Status: AC

## 2019-08-17 MED ORDER — AZITHROMYCIN 500 MG PO TABS: 500.0000 mg | ORAL_TABLET | Freq: Every day | ORAL | 0 refills | Status: AC

## 2019-08-17 MED ORDER — BUDESONIDE 90 MCG/ACT IN AEPB: 1.0000 | INHALATION_SPRAY | Freq: Two times a day (BID) | RESPIRATORY_TRACT | 0 refills | Status: DC

## 2019-08-17 NOTE — Progress Notes (Addendum)
Discharge teaching done. Questions answered. 1300 Discharge home with friend.

## 2019-08-17 NOTE — Progress Notes (Signed)
SUBJECTIVE: Patient denies any chest pain but very short of breath   Vitals:   08/17/19 0300 08/17/19 0400 08/17/19 0500 08/17/19 0600  BP: (!) 104/50 (!) 113/50 (!) 108/50 (!) 104/50  Pulse: 73 74 73 74  Resp: (!) 26 (!) 22 20 (!) 24  Temp:  98.3 F (36.8 C)    TempSrc:  Oral    SpO2: 93% 93% 95% 94%  Weight:      Height:        Intake/Output Summary (Last 24 hours) at 08/17/2019 0858 Last data filed at 08/17/2019 0250 Gross per 24 hour  Intake --  Output 800 ml  Net -800 ml    LABS: Basic Metabolic Panel: Recent Labs    08/16/19 0423 08/17/19 0545  NA 135 138  K 4.9 4.5  CL 101 104  CO2 22 27  GLUCOSE 162* 109*  BUN 44* 38*  CREATININE 1.27* 0.98  CALCIUM 9.7 8.5*  MG 2.3 2.2   Liver Function Tests: Recent Labs    08/16/19 0423 08/17/19 0545  AST 37 39  ALT 26 40  ALKPHOS 78 75  BILITOT 0.6 0.7  PROT 6.8 6.1*  ALBUMIN 3.6 3.2*   No results for input(s): LIPASE, AMYLASE in the last 72 hours. CBC: Recent Labs    08/16/19 0423 08/17/19 0545  WBC 18.6* 14.7*  HGB 12.2* 11.2*  HCT 35.3* 32.6*  MCV 97.0 97.0  PLT 228 197   Cardiac Enzymes: No results for input(s): CKTOTAL, CKMB, CKMBINDEX, TROPONINI in the last 72 hours. BNP: Invalid input(s): POCBNP D-Dimer: No results for input(s): DDIMER in the last 72 hours. Hemoglobin A1C: No results for input(s): HGBA1C in the last 72 hours. Fasting Lipid Panel: No results for input(s): CHOL, HDL, LDLCALC, TRIG, CHOLHDL, LDLDIRECT in the last 72 hours. Thyroid Function Tests: Recent Labs    08/15/19 0528  TSH 1.568   Anemia Panel: No results for input(s): VITAMINB12, FOLATE, FERRITIN, TIBC, IRON, RETICCTPCT in the last 72 hours.   PHYSICAL EXAM General: Well developed, well nourished, in no acute distress HEENT:  Normocephalic and atramatic Neck:  No JVD.  Lungs: Clear bilaterally to auscultation and percussion. Heart: HRRR . Normal S1 and S2 without gallops or murmurs.  Abdomen: Bowel sounds  are positive, abdomen soft and non-tender  Msk:  Back normal, normal gait. Normal strength and tone for age. Extremities: No clubbing, cyanosis or edema.   Neuro: Alert and oriented X 3. 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  Patient is still very short of breath and most likely COPD for which he is being treated and is on home oxygen.  Advise pulmonary evaluation and home oxygen prior to discharge.  Patient is to sick right now to have cardiac catheterization and would rather instead wait a week or 2 after discharge and do CTA coronaries which is a noninvasive procedure to evaluate coronaries instead of catheterization.  Patient denies chest pain and troponins were only mildly elevated.  Cardiac point of view once pulmonary status is stable can be discharged with follow-up in the office next Tuesday at 10:00.  Principal Problem:   Exertional chest pain Active Problems:   COPD with acute exacerbation (HCC)   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HCC)   Nicotine dependence   Moderate protein-calorie malnutrition (HCC)   Protein-calorie malnutrition, severe    Montine Hight A, MD, [facility] 08/17/2019 8:58 AM
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
## 2019-08-17 NOTE — Progress Notes (Signed)
OT Cancellation Note  Patient Details Name: Todd Webster Sr. MRN: [id number] DOB: 1941/02/02   Cancelled Treatment:    Reason Eval/Treat Not Completed: OT screened, no needs identified, will sign off  Upon speaking with physical therapist, per RN, pt mobilizing independently on unit with RW with no LOB. Has RW at home. Pt does not appear to have acute occupational therapy needs at this time. Will complete order and s/o. Thank you.  Gerrianne Scale, [place], OTR/L ascom [phone] 08/17/19, 1:12 PM

## 2019-08-17 NOTE — Discharge Instructions (Addendum)
Pacemaker  

## 2019-10-05 ENCOUNTER — Other Ambulatory Visit: Payer: Self-pay

## 2019-10-05 ENCOUNTER — Encounter: Payer: Medicare Other | Attending: Cardiovascular Disease

## 2019-10-05 DIAGNOSIS — Z7951 Long term (current) use of inhaled steroids: Secondary | ICD-10-CM | POA: Insufficient documentation

## 2019-10-05 DIAGNOSIS — Z87891 Personal history of nicotine dependence: Secondary | ICD-10-CM | POA: Insufficient documentation

## 2019-10-05 DIAGNOSIS — Z79899 Other long term (current) drug therapy: Secondary | ICD-10-CM | POA: Insufficient documentation

## 2019-10-05 DIAGNOSIS — J449 Chronic obstructive pulmonary disease, unspecified: Secondary | ICD-10-CM | POA: Insufficient documentation

## 2019-10-05 NOTE — Progress Notes (Signed)
Virtual Orientation performed. Patient informed when to come in for RD and EP orientation. Diagnosis can be found in [facility] 09/06/2019

## 2019-10-09 ENCOUNTER — Other Ambulatory Visit: Payer: Self-pay

## 2019-10-09 DIAGNOSIS — Z79899 Other long term (current) drug therapy: Secondary | ICD-10-CM | POA: Diagnosis not present

## 2019-10-09 DIAGNOSIS — Z7951 Long term (current) use of inhaled steroids: Secondary | ICD-10-CM | POA: Diagnosis not present

## 2019-10-09 DIAGNOSIS — Z87891 Personal history of nicotine dependence: Secondary | ICD-10-CM | POA: Diagnosis not present

## 2019-10-09 DIAGNOSIS — J449 Chronic obstructive pulmonary disease, unspecified: Secondary | ICD-10-CM

## 2019-10-09 NOTE — Progress Notes (Signed)
Daily Session Note  Patient Details  Name: Todd WIRT Sr. MRN: [id number] Date of Birth: 1941/05/06 Referring Provider:    Encounter Date: 10/09/2019  Check In: Session Check In - 10/09/19 1328      Check-In   Supervising physician immediately available to respond to emergencies  See telemetry face sheet for immediately available ER MD    Location  [facility]    Staff Present  Heath Lark, RN, BSN, CCRP;Jessica Taylor Ridge, MA, RCEP, CCRP, CCET;Melissa Halfway RDN, LDN    Virtual Visit  No    Medication changes reported      No    Fall or balance concerns reported     No    Warm-up and Cool-down  Performed on first and last piece of equipment    Resistance Training Performed  Yes    VAD Patient?  No    PAD/SET Patient?  No      Pain Assessment   Currently in Pain?  No/denies          Social History   Tobacco Use  Smoking Status Former Smoker  . Packs/day: 1.00  . Years: 64.00  . Pack years: 64.00  . Types: Cigarettes  . Quit date: 08/14/2019  . Years since quitting: 0.1  Smokeless Tobacco Never Used  Tobacco Comment   patient states that he is done smoking    Goals Met:  Proper associated with RPD/PD & O2 Sat Independence with exercise equipment Exercise tolerated well No report of cardiac concerns or symptoms  Goals Unmet:  Not Applicable  Comments: Pt able to follow exercise prescription today without complaint.  Will continue to monitor for progression.    Dr. Emily Filbert is Medical Director for [facility].

## 2019-10-09 NOTE — Progress Notes (Signed)
Pulmonary Individual Treatment Plan  Patient Details  Name: Todd GINGRAS Sr. MRN: [id number] Date of Birth: 1940-12-24 Referring Provider:     Pulmonary Rehab from 10/09/2019 in [facility] Cardiac and Pulmonary Rehab  Referring Provider  Humphrey Rolls      Initial Encounter Date:    Pulmonary Rehab from 10/09/2019 in [facility] Cardiac and Pulmonary Rehab  Date  10/09/19      Visit Diagnosis: No diagnosis found.  Patient's Home Medications on Admission:  Current Outpatient Medications:  .  acetaminophen (TYLENOL) 325 MG tablet, Take 2 tablets (650 mg total) by mouth every 4 (four) hours as needed for headache or mild pain., Disp:  , Rfl:  .  Budesonide 90 MCG/ACT inhaler, Inhale 1 puff into the lungs 2 (two) times daily., Disp: 1 each, Rfl: 0 .  Ipratropium-Albuterol (COMBIVENT) 20-100 MCG/ACT AERS respimat, Inhale 1 puff into the lungs every 6 (six) hours., Disp: 4 g, Rfl: 0 .  latanoprost (XALATAN) 0.005 % ophthalmic solution, Place 1 drop into the left eye at bedtime., Disp: , Rfl:  .  Multiple Vitamins tablet, Take 1 tablet by mouth daily at 12 noon. , Disp: , Rfl:  .  SIMBRINZA 1-0.2 % SUSP, Place 1 drop into both eyes 2 (two) times daily., Disp: , Rfl:  .  vitamin B-12 (CYANOCOBALAMIN) 1000 MCG tablet, Take 1,000 mcg by mouth daily at 12 noon. , Disp: , Rfl:   Past Medical History: Past Medical History:  Diagnosis Date  . COPD (chronic obstructive pulmonary disease) (HCC)     Tobacco Use: Social History   Tobacco Use  Smoking Status Former Smoker  . Packs/day: 1.00  . Years: 64.00  . Pack years: 64.00  . Types: Cigarettes  . Quit date: 08/14/2019  . Years since quitting: 0.1  Smokeless Tobacco Never Used  Tobacco Comment   patient states that he is done smoking    Labs: Recent Review Flowsheet Data    There is no flowsheet data to display.       Pulmonary Assessment Scores:   UCSD: Self-administered rating of dyspnea associated with activities of daily living (ADLs) 6-point  scale (0 = "not at all" to 5 = "maximal or unable to do because of breathlessness")  Scoring Scores range from 0 to 120.  Minimally important difference is 5 units  CAT: CAT can identify the health impairment of COPD patients and is better correlated with disease progression.  CAT has a scoring range of zero to 40. The CAT score is classified into four groups of low (less than 10), medium (10 - 20), high (21-30) and very high (31-40) based on the impact level of disease on health status. A CAT score over 10 suggests significant symptoms.  A worsening CAT score could be explained by an exacerbation, poor medication adherence, poor inhaler technique, or progression of COPD or comorbid conditions.  CAT MCID is 2 points  mMRC: mMRC (Modified Medical Research Council) Dyspnea Scale is used to assess the degree of baseline functional disability in patients of respiratory disease due to dyspnea. No minimal important difference is established. A decrease in score of 1 point or greater is considered a positive change.   Pulmonary Function Assessment: Pulmonary Function Assessment - 10/05/19 1400      Breath   Shortness of Breath  Yes;Limiting activity       Exercise Target Goals: Exercise Program Goal: Individual exercise prescription set using results from initial 6 min walk test and THRR while considering  patient's activity  barriers and safety.   Exercise Prescription Goal: Initial exercise prescription builds to 30-45 minutes a day of aerobic activity, 2-3 days per week.  Home exercise guidelines will be given to patient during program as part of exercise prescription that the participant will acknowledge.  Activity Barriers & Risk Stratification:   6 Minute Walk: 6 Minute Walk    Row Name 10/09/19 1407         6 Minute Walk   Phase  Initial     Distance  740 feet     Walk Time  5 minutes     # of Rest Breaks  1     MPH  1.68     METS  2.35     RPE  13     Perceived Dyspnea   3      VO2 Peak  8.2     Symptoms  Yes (comment)     Comments  shortness of breath     Resting HR  83 bpm     Resting BP  140/60     Resting Oxygen Saturation   96 %     Exercise Oxygen Saturation  during 6 min walk  91 %     Max Ex. HR  91 bpm     Max Ex. BP  158/50     2 Minute Post BP  132/64       Interval HR   1 Minute HR  87     2 Minute HR  89     3 Minute HR  85     4 Minute HR  71     5 Minute HR  74     6 Minute HR  91     2 Minute Post HR  99     Interval Heart Rate?  Yes       Interval Oxygen   Interval Oxygen?  Yes     Baseline Oxygen Saturation %  96 %     1 Minute Oxygen Saturation %  91 %     1 Minute Liters of Oxygen  0 L     2 Minute Oxygen Saturation %  94 %     2 Minute Liters of Oxygen  0 L     3 Minute Oxygen Saturation %  95 %     3 Minute Liters of Oxygen  0 L     4 Minute Oxygen Saturation %  97 %     4 Minute Liters of Oxygen  0 L     5 Minute Oxygen Saturation %  96 %     5 Minute Liters of Oxygen  0 L     6 Minute Oxygen Saturation %  95 %     6 Minute Liters of Oxygen  0 L     2 Minute Post Oxygen Saturation %  98 %     2 Minute Post Liters of Oxygen  0 L       Oxygen Initial Assessment: Oxygen Initial Assessment - 10/05/19 1400      Home Oxygen   Home Oxygen Device  None    Sleep Oxygen Prescription  None    Home Exercise Oxygen Prescription  None    Home at Rest Exercise Oxygen Prescription  None      Initial 6 min Walk   Oxygen Used  None      Program Oxygen Prescription   Program Oxygen Prescription  None  Intervention   Short Term Goals  To learn and understand importance of monitoring SPO2 with pulse oximeter and demonstrate accurate use of the pulse oximeter.;To learn and understand importance of maintaining oxygen saturations>88%;To learn and demonstrate proper pursed lip breathing techniques or other breathing techniques.;To learn and demonstrate proper use of respiratory medications    Long  Term Goals  Verbalizes  importance of monitoring SPO2 with pulse oximeter and return demonstration;Maintenance of O2 saturations>88%;Exhibits proper breathing techniques, such as pursed lip breathing or other method taught during program session;Compliance with respiratory medication;Demonstrates proper use of MDI's       Oxygen Re-Evaluation:   Oxygen Discharge (Final Oxygen Re-Evaluation):   Initial Exercise Prescription: Initial Exercise Prescription - 10/09/19 1400      Date of Initial Exercise RX and Referring Provider   Date  10/09/19    Referring Provider  Humphrey Rolls      Treadmill   MPH  1.6    Grade  0.5    Minutes  15    METs  2.3      NuStep   Level  2    SPM  80    Minutes  15    METs  2.3      Biostep-RELP   Level  2    SPM  50    Minutes  15    METs  2      Prescription Details   Frequency (times per week)  3    Duration  Progress to 30 minutes of continuous aerobic without signs/symptoms of physical distress      Intensity   THRR 40-80% of Max Heartrate  107-131    Ratings of Perceived Exertion  11-13    Perceived Dyspnea  0-4      Resistance Training   Training Prescription  Yes    Weight  3 lb    Reps  10-15       Perform Capillary Blood Glucose checks as needed.  Exercise Prescription Changes:   Exercise Comments:   Exercise Goals and Review: Exercise Goals    Row Name 10/09/19 1423             Exercise Goals   Increase Physical Activity  Yes       Intervention  Provide advice, education, support and counseling about physical activity/exercise needs.;Develop an individualized exercise prescription for aerobic and resistive training based on initial evaluation findings, risk stratification, comorbidities and participant's personal goals.       Expected Outcomes  Short Term: Attend rehab on a regular basis to increase amount of physical activity.;Long Term: Add in home exercise to make exercise part of routine and to increase amount of physical activity.;Long  Term: Exercising regularly at least 3-5 days a week.       Increase Strength and Stamina  Yes       Intervention  Provide advice, education, support and counseling about physical activity/exercise needs.;Develop an individualized exercise prescription for aerobic and resistive training based on initial evaluation findings, risk stratification, comorbidities and participant's personal goals.       Expected Outcomes  Short Term: Increase workloads from initial exercise prescription for resistance, speed, and METs.;Short Term: Perform resistance training exercises routinely during rehab and add in resistance training at home;Long Term: Improve cardiorespiratory fitness, muscular endurance and strength as measured by increased METs and functional capacity (6MWT)       Able to understand and use rate of perceived exertion (RPE) scale  Yes  Intervention  Provide education and explanation on how to use RPE scale       Expected Outcomes  Short Term: Able to use RPE daily in rehab to express subjective intensity level;Long Term:  Able to use RPE to guide intensity level when exercising independently       Able to understand and use Dyspnea scale  Yes       Intervention  Provide education and explanation on how to use Dyspnea scale       Expected Outcomes  Short Term: Able to use Dyspnea scale daily in rehab to express subjective sense of shortness of breath during exertion;Long Term: Able to use Dyspnea scale to guide intensity level when exercising independently       Knowledge and understanding of Target Heart Rate Range (THRR)  Yes       Intervention  Provide education and explanation of THRR including how the numbers were predicted and where they are located for reference       Expected Outcomes  Short Term: Able to state/look up THRR;Short Term: Able to use daily as guideline for intensity in rehab;Long Term: Able to use THRR to govern intensity when exercising independently       Able to check pulse  independently  Yes       Intervention  Provide education and demonstration on how to check pulse in carotid and radial arteries.;Review the importance of being able to check your own pulse for safety during independent exercise       Expected Outcomes  Short Term: Able to explain why pulse checking is important during independent exercise;Long Term: Able to check pulse independently and accurately       Understanding of Exercise Prescription  Yes       Intervention  Provide education, explanation, and written materials on patient's individual exercise prescription       Expected Outcomes  Short Term: Able to explain program exercise prescription;Long Term: Able to explain home exercise prescription to exercise independently          Exercise Goals Re-Evaluation :   Discharge Exercise Prescription (Final Exercise Prescription Changes):   Nutrition:  Target Goals: Understanding of nutrition guidelines, daily intake of sodium <1572m, cholesterol <2053m calories 30% from fat and 7% or less from saturated fats, daily to have 5 or more servings of fruits and vegetables.  Biometrics:    Nutrition Therapy Plan and Nutrition Goals:   Nutrition Assessments:   Nutrition Goals Re-Evaluation:   Nutrition Goals Discharge (Final Nutrition Goals Re-Evaluation):   Psychosocial: Target Goals: Acknowledge presence or absence of significant depression and/or stress, maximize coping skills, provide positive support system. Participant is able to verbalize types and ability to use techniques and skills needed for reducing stress and depression.   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      Family Dynamics   Good Support System?  Yes    Comments  He can look  to his wife, son and neighbor.      Barriers   Psychosocial barriers to participate in program  There are no identifiable barriers or psychosocial needs.;The patient should benefit from training in stress management and relaxation.      Screening Interventions  Interventions  Encouraged to exercise;To provide support and resources with identified psychosocial needs;Provide feedback about the scores to participant    Expected Outcomes  Short Term goal: Utilizing psychosocial counselor, staff and physician to assist with identification of specific Stressors or current issues interfering with healing process. Setting desired goal for each stressor or current issue identified.;Long Term Goal: Stressors or current issues are controlled or eliminated.;Short Term goal: Identification and review with participant of any Quality of Life or Depression concerns found by scoring the questionnaire.;Long Term goal: The participant improves quality of Life and PHQ9 Scores as seen by post scores and/or verbalization of changes       Quality of Life Scores:  Scores of 19 and below usually indicate a poorer quality of life in these areas.  A difference of  2-3 points is a clinically meaningful difference.  A difference of 2-3 points in the total score of the Quality of Life Index has been associated with significant improvement in overall quality of life, self-image, physical symptoms, and general health in studies assessing change in quality of life.  PHQ-9: Recent Review Flowsheet Data    There is no flowsheet data to display.     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 He has a good family support system.    Expected Outcomes  Short: Attend [facility] stress management education to decrease stress. Long: Maintain exercise Post [facility] to keep stress at a minimum.    Continue Psychosocial Services   Follow up required by staff       Psychosocial Re-Evaluation:   Psychosocial Discharge (Final Psychosocial Re-Evaluation):   Education: Education Goals: Education classes will be provided on a weekly basis, covering required topics. Participant will state understanding/return demonstration of topics presented.  Learning Barriers/Preferences: Learning Barriers/Preferences - 10/05/19 1402      Learning Barriers/Preferences   Learning Barriers  Sight    Learning Preferences  None       Education Topics:  Initial Evaluation Education: - Verbal, written and demonstration of respiratory meds, oximetry and breathing techniques. Instruction on use of nebulizers and MDIs and importance of monitoring MDI activations.   General Nutrition Guidelines/Fats and Fiber: -Group instruction provided by verbal, written material, models and posters to present the general guidelines for heart healthy nutrition. Gives an explanation and review of dietary fats and fiber.   Controlling Sodium/Reading Food Labels: -Group verbal and written material supporting the discussion of sodium use in heart healthy nutrition. Review and explanation with models, verbal and written materials for utilization of the food label.   Exercise Physiology & General Exercise Guidelines: - Group verbal and written instruction with models to review the exercise physiology of the cardiovascular system and associated critical values. Provides general exercise guidelines with specific guidelines to those with heart or lung disease.    Aerobic Exercise & Resistance  Training: - Gives group verbal and written instruction on the various components of exercise. Focuses on aerobic and resistive training programs and the benefits of this training and how to safely progress through these programs.  Flexibility, Balance, Mind/Body Relaxation: Provides group verbal/written instruction on the benefits of flexibility and balance training, including mind/body exercise modes such as yoga, pilates and tai chi.  Demonstration and skill practice provided.   Stress and Anxiety: - Provides group verbal and written instruction about the health risks of elevated stress and causes of high stress.  Discuss the correlation between heart/lung disease and anxiety and treatment options. Review healthy ways to manage with stress and anxiety.   Depression: - Provides group verbal and written instruction on the correlation between heart/lung disease and depressed mood, treatment options, and the stigmas associated with seeking treatment.   Exercise & Equipment Safety: - Individual verbal instruction and demonstration of equipment use and safety with use of the equipment.   Pulmonary Rehab from 10/09/2019 in [facility] Cardiac and Pulmonary Rehab  Date  10/09/19  Educator  AS  Instruction Review Code  1- Verbalizes Understanding      Infection Prevention: - Provides verbal and written material to individual with discussion of infection control including proper hand washing and proper equipment cleaning during exercise session.   Pulmonary Rehab from 10/09/2019 in [facility] Cardiac and Pulmonary Rehab  Date  10/09/19  Educator  AS  Instruction Review Code  1- Verbalizes Understanding      Falls Prevention: - Provides verbal and written material to individual with discussion of falls prevention and safety.   Pulmonary Rehab from 10/09/2019 in [facility] Cardiac and Pulmonary Rehab  Date  10/09/19  Educator  AS  Instruction Review Code  1- Verbalizes Understanding      Diabetes: -  Individual verbal and written instruction to review signs/symptoms of diabetes, desired ranges of glucose level fasting, after meals and with exercise. Advice that pre and post exercise glucose checks will be done for 3 sessions at entry of program.   Chronic Lung Diseases: - Group verbal and written instruction to review updates, respiratory medications, advancements in procedures and treatments. Discuss use of supplemental oxygen including available portable oxygen systems, continuous and intermittent flow rates, concentrators, personal use and safety guidelines. Review proper use of inhaler and spacers. Provide informative websites for self-education.    Energy Conservation: - Provide group verbal and written instruction for methods to conserve energy, plan and organize activities. Instruct on pacing techniques, use of adaptive equipment and posture/positioning to relieve shortness of breath.   Triggers and Exacerbations: - Group verbal and written instruction to review types of environmental triggers and ways to prevent exacerbations. Discuss weather changes, air quality and the benefits of nasal washing. Review warning signs and symptoms to help prevent infections. Discuss techniques for effective airway clearance, coughing, and vibrations.   AED/CPR: - Group verbal and written instruction with the use of models to demonstrate the basic use of the AED with the basic ABC's of resuscitation.   Anatomy and Physiology of the Lungs: - Group verbal and written instruction with the use of models to provide basic lung anatomy and physiology related to function, structure and complications of lung disease.   Anatomy & Physiology of the Heart: - Group verbal and written instruction and models provide basic cardiac anatomy and physiology, with the coronary electrical and arterial systems. Review of Valvular disease and Heart Failure   Cardiac Medications: - Group verbal and written instruction  to review commonly prescribed medications for heart disease. Reviews the medication, class of the drug, and side effects.   Know Your Numbers and Risk Factors: -Group verbal and written instruction about important numbers in your health.  Discussion of what are risk factors and how they play a role in the disease process.  Review of Cholesterol, Blood Pressure, Diabetes, and BMI and the role they play in your overall health.   Sleep Hygiene: -Provides group verbal and written instruction about how sleep can affect your health.  Define sleep hygiene, discuss sleep cycles and impact of sleep habits. Review good sleep hygiene tips.    Other: -Provides group and verbal instruction on various topics (see comments)    Knowledge Questionnaire Score:    Core Components/Risk Factors/Patient Goals at Admission: Personal Goals and Risk Factors at Admission - 10/09/19 1421      Core Components/Risk Factors/Patient Goals on Admission    Weight Management  Yes;Weight Gain    Intervention  Weight Management: Develop a combined nutrition and exercise program designed to reach desired caloric intake, while maintaining appropriate intake of nutrient and fiber, sodium and fats, and appropriate energy expenditure required for the weight goal.;Weight Management: Provide education and appropriate resources to help participant work on and attain dietary goals.;Weight Management/Obesity: Establish reasonable short term and long term weight goals.    Admit Weight  133 lb (60.3 kg)    Goal Weight: Short Term  143 lb (64.9 kg)    Goal Weight: Long Term  150 lb (68 kg)    Expected Outcomes  Short Term: Continue to assess and modify interventions until short term weight is achieved;Long Term: Adherence to nutrition and physical activity/exercise program aimed toward attainment of established weight goal;Understanding recommendations for meals to include 15-35% energy as protein, 25-35% energy from fat, 35-60% energy  from carbohydrates, less than 238m of dietary cholesterol, 20-35 gm of total fiber daily;Understanding of distribution of calorie intake throughout the day with the consumption of 4-5 meals/snacks    Number of packs per day  Patient has quit in January. former 1 pack a day    Intervention  Assist the participant in steps to quit. Provide individualized education and counseling about committing to Tobacco Cessation, relapse prevention, and pharmacological support that can be provided by physician.;OAdvice worker assist with locating and accessing local/national Quit Smoking programs, and support quit date choice.    Expected Outcomes  Short Term: Will demonstrate readiness to quit, by selecting a quit date.;Long Term: Complete abstinence from all tobacco products for at least 12 months from quit date.;Short Term: Will quit all tobacco product use, adhering to prevention of relapse plan.    Intervention  Provide education, individualized exercise plan and daily activity instruction to help decrease symptoms of SOB with activities of daily living.    Expected Outcomes  Short Term: Improve cardiorespiratory fitness to achieve a reduction of symptoms when performing ADLs;Long Term: Be able to perform more ADLs without symptoms or delay the onset of symptoms    Heart Failure  Yes    Intervention  Provide a combined exercise and nutrition program that is supplemented with education, support and counseling about heart failure. Directed toward relieving symptoms such as shortness of breath, decreased exercise tolerance, and extremity edema.    Expected Outcomes  Improve functional capacity of life;Short term: Attendance in program 2-3 days a week with increased exercise capacity. Reported lower sodium intake. Reported increased fruit and vegetable intake. Reports medication compliance.;Short term: Daily weights obtained and reported for increase. Utilizing diuretic protocols set by physician.;Long  term: Adoption of self-care skills and reduction of barriers for early signs and symptoms recognition and intervention leading to self-care maintenance.  Core Components/Risk Factors/Patient Goals Review:    Core Components/Risk Factors/Patient Goals at Discharge (Final Review):    ITP Comments:   Comments: initial ITP

## 2019-10-09 NOTE — Patient Instructions (Addendum)
Patient Instructions  Patient Details  Name: Todd SLAGTER Sr. MRN: [id number] Date of Birth: 26-May-1941 Referring Provider:  Dionisio David, MD  Below are your personal goals for exercise, nutrition, and risk factors. Our goal is to help you stay on track towards obtaining and maintaining these goals. We will be discussing your progress on these goals with you throughout the program.  Initial Exercise Prescription: Initial Exercise Prescription - 10/09/19 1400      Date of Initial Exercise RX and Referring Provider   Date  10/09/19    Referring Provider  Humphrey Rolls      Treadmill   MPH  1.6    Grade  0.5    Minutes  15    METs  2.3      NuStep   Level  2    SPM  80    Minutes  15    METs  2.3      Biostep-RELP   Level  2    SPM  50    Minutes  15    METs  2      Prescription Details   Frequency (times per week)  3    Duration  Progress to 30 minutes of continuous aerobic without signs/symptoms of physical distress      Intensity   THRR 40-80% of Max Heartrate  107-131    Ratings of Perceived Exertion  11-13    Perceived Dyspnea  0-4      Resistance Training   Training Prescription  Yes    Weight  3 lb    Reps  10-15       Exercise Goals: Frequency: Be able to perform aerobic exercise two to three times per week in program working toward 2-5 days per week of home exercise.  Intensity: Work with a perceived exertion of 11 (fairly light) - 15 (hard) while following your exercise prescription.  We will make changes to your prescription with you as you progress through the program.   Duration: Be able to do 30 to 45 minutes of continuous aerobic exercise in addition to a 5 minute warm-up and a 5 minute cool-down routine.   Nutrition Goals: Your personal nutrition goals will be established when you do your nutrition analysis with the dietician.  The following are general nutrition guidelines to follow: Cholesterol < 200mg /day Sodium < 1500mg /day Fiber: Men over 50  yrs - 30 grams per day  Personal Goals: Personal Goals and Risk Factors at Admission - 10/09/19 1421      Core Components/Risk Factors/Patient Goals on Admission    Weight Management  Yes;Weight Gain    Intervention  Weight Management: Develop a combined nutrition and exercise program designed to reach desired caloric intake, while maintaining appropriate intake of nutrient and fiber, sodium and fats, and appropriate energy expenditure required for the weight goal.;Weight Management: Provide education and appropriate resources to help participant work on and attain dietary goals.;Weight Management/Obesity: Establish reasonable short term and long term weight goals.    Admit Weight  133 lb (60.3 kg)    Goal Weight: Short Term  143 lb (64.9 kg)    Goal Weight: Long Term  150 lb (68 kg)    Expected Outcomes  Short Term: Continue to assess and modify interventions until short term weight is achieved;Long Term: Adherence to nutrition and physical activity/exercise program aimed toward attainment of established weight goal;Understanding recommendations for meals to include 15-35% energy as protein, 25-35% energy from fat, 35-60% energy from carbohydrates,  less than 200mg  of dietary cholesterol, 20-35 gm of total fiber daily;Understanding of distribution of calorie intake throughout the day with the consumption of 4-5 meals/snacks    Number of packs per day  Patient has quit in January. former 1 pack a day    Intervention  Assist the participant in steps to quit. Provide individualized education and counseling about committing to Tobacco Cessation, relapse prevention, and pharmacological support that can be provided by physician.;Advice worker, assist with locating and accessing local/national Quit Smoking programs, and support quit date choice.    Expected Outcomes  Short Term: Will demonstrate readiness to quit, by selecting a quit date.;Long Term: Complete abstinence from all tobacco  products for at least 12 months from quit date.;Short Term: Will quit all tobacco product use, adhering to prevention of relapse plan.    Intervention  Provide education, individualized exercise plan and daily activity instruction to help decrease symptoms of SOB with activities of daily living.    Expected Outcomes  Short Term: Improve cardiorespiratory fitness to achieve a reduction of symptoms when performing ADLs;Long Term: Be able to perform more ADLs without symptoms or delay the onset of symptoms    Heart Failure  Yes    Intervention  Provide a combined exercise and nutrition program that is supplemented with education, support and counseling about heart failure. Directed toward relieving symptoms such as shortness of breath, decreased exercise tolerance, and extremity edema.    Expected Outcomes  Improve functional capacity of life;Short term: Attendance in program 2-3 days a week with increased exercise capacity. Reported lower sodium intake. Reported increased fruit and vegetable intake. Reports medication compliance.;Short term: Daily weights obtained and reported for increase. Utilizing diuretic protocols set by physician.;Long term: Adoption of self-care skills and reduction of barriers for early signs and symptoms recognition and intervention leading to self-care maintenance.       Tobacco Use Initial Evaluation: Social History   Tobacco Use  Smoking Status Former Smoker  . Packs/day: 1.00  . Years: 64.00  . Pack years: 64.00  . Types: Cigarettes  . Quit date: 08/14/2019  . Years since quitting: 0.1  Smokeless Tobacco Never Used  Tobacco Comment   patient states that he is done smoking    Exercise Goals and Review: Exercise Goals    Row Name 10/09/19 1423             Exercise Goals   Increase Physical Activity  Yes       Intervention  Provide advice, education, support and counseling about physical activity/exercise needs.;Develop an individualized exercise  prescription for aerobic and resistive training based on initial evaluation findings, risk stratification, comorbidities and participant's personal goals.       Expected Outcomes  Short Term: Attend rehab on a regular basis to increase amount of physical activity.;Long Term: Add in home exercise to make exercise part of routine and to increase amount of physical activity.;Long Term: Exercising regularly at least 3-5 days a week.       Increase Strength and Stamina  Yes       Intervention  Provide advice, education, support and counseling about physical activity/exercise needs.;Develop an individualized exercise prescription for aerobic and resistive training based on initial evaluation findings, risk stratification, comorbidities and participant's personal goals.       Expected Outcomes  Short Term: Increase workloads from initial exercise prescription for resistance, speed, and METs.;Short Term: Perform resistance training exercises routinely during rehab and add in resistance training at home;Long Term: Improve  cardiorespiratory fitness, muscular endurance and strength as measured by increased METs and functional capacity (6MWT)       Able to understand and use rate of perceived exertion (RPE) scale  Yes       Intervention  Provide education and explanation on how to use RPE scale       Expected Outcomes  Short Term: Able to use RPE daily in rehab to express subjective intensity level;Long Term:  Able to use RPE to guide intensity level when exercising independently       Able to understand and use Dyspnea scale  Yes       Intervention  Provide education and explanation on how to use Dyspnea scale       Expected Outcomes  Short Term: Able to use Dyspnea scale daily in rehab to express subjective sense of shortness of breath during exertion;Long Term: Able to use Dyspnea scale to guide intensity level when exercising independently       Knowledge and understanding of Target Heart Rate Range (THRR)  Yes        Intervention  Provide education and explanation of THRR including how the numbers were predicted and where they are located for reference       Expected Outcomes  Short Term: Able to state/look up THRR;Short Term: Able to use daily as guideline for intensity in rehab;Long Term: Able to use THRR to govern intensity when exercising independently       Able to check pulse independently  Yes       Intervention  Provide education and demonstration on how to check pulse in carotid and radial arteries.;Review the importance of being able to check your own pulse for safety during independent exercise       Expected Outcomes  Short Term: Able to explain why pulse checking is important during independent exercise;Long Term: Able to check pulse independently and accurately       Understanding of Exercise Prescription  Yes       Intervention  Provide education, explanation, and written materials on patient's individual exercise prescription       Expected Outcomes  Short Term: Able to explain program exercise prescription;Long Term: Able to explain home exercise prescription to exercise independently          Copy of goals given to participant.

## 2019-10-11 ENCOUNTER — Other Ambulatory Visit: Payer: Self-pay

## 2019-10-11 ENCOUNTER — Telehealth: Payer: Self-pay | Admitting: Urology

## 2019-10-11 NOTE — Telephone Encounter (Signed)
Patient lm on vm to cx his app when I called back to reschd his vm was full culd not lm to cb to reschd

## 2019-10-13 ENCOUNTER — Ambulatory Visit: Payer: Self-pay | Admitting: Urology

## 2019-10-16 ENCOUNTER — Encounter: Payer: Medicare Other | Admitting: *Deleted

## 2019-10-16 ENCOUNTER — Other Ambulatory Visit: Payer: Self-pay

## 2019-10-16 DIAGNOSIS — J449 Chronic obstructive pulmonary disease, unspecified: Secondary | ICD-10-CM

## 2019-10-16 NOTE — Progress Notes (Signed)
Daily Session Note  Patient Details  Name: Todd RATHJE Sr. MRN: [id number] Date of Birth: [birth year] Referring Provider:     Pulmonary Rehab from 10/09/2019 in [facility] Cardiac and Pulmonary Rehab  Referring Provider  Humphrey Rolls      Encounter Date: 10/16/2019  Check In: Session Check In - 10/16/19 1023      Check-In   Supervising physician immediately available to respond to emergencies  See telemetry face sheet for immediately available ER MD    Location  [facility]-Cardiac & Pulmonary Rehab    Staff Present  Renita Papa, RN BSN;Joseph [address] [place], [place], ACSM CEP, Exercise Physiologist    Virtual Visit  No    Medication changes reported      No    Fall or balance concerns reported     No    Warm-up and Cool-down  Performed on first and last piece of equipment    Resistance Training Performed  Yes    VAD Patient?  No    PAD/SET Patient?  No      Pain Assessment   Currently in Pain?  No/denies          Social History   Tobacco Use  Smoking Status Former Smoker  . Packs/day: 1.00  . Years: 64.00  . Pack years: 64.00  . Types: Cigarettes  . Quit date: 08/14/2019  . Years since quitting: 0.1  Smokeless Tobacco Never Used  Tobacco Comment   patient states that he is done smoking    Goals Met:  Proper associated with RPD/PD & O2 Sat Independence with exercise equipment Using PLB without cueing & demonstrates good technique Exercise tolerated well No report of cardiac concerns or symptoms Strength training completed today  Goals Unmet:  Not Applicable  Comments: First full day of exercise!  Patient was oriented to gym and equipment including functions, settings, policies, and procedures.  Patient's individual exercise prescription and treatment plan were reviewed.  All starting workloads were established based on the results of the 6 minute walk test done at initial orientation visit.  The plan for exercise progression was also introduced and progression will  be customized based on patient's performance and goals.    Dr. Emily Filbert is Medical Director for [place] and [facility].

## 2019-10-20 ENCOUNTER — Encounter: Payer: Medicare Other | Admitting: *Deleted

## 2019-10-20 ENCOUNTER — Other Ambulatory Visit: Payer: Self-pay

## 2019-10-20 DIAGNOSIS — J449 Chronic obstructive pulmonary disease, unspecified: Secondary | ICD-10-CM | POA: Diagnosis not present

## 2019-10-20 NOTE — Progress Notes (Signed)
Daily Session Note  Patient Details  Name: Todd Webster. MRN: [id number] Date of Birth: 17-Jul-1941 Referring Provider:     Pulmonary Rehab from 10/09/2019 in [facility] Card[facility] and Pulmonary Rehab  Referring Provider  Humphrey Rolls      Encounter Date: 10/20/2019  Check In: Session Check In - 10/20/19 1109      Check-In   Supervising physician immediately available to respond to emergencies  See telemetry face sheet for immediately available ER MD    Location  [facility]-Cardiac & Pulmonary Rehab    Staff Present  Renita Papa, RN BSN;Joseph [address] [place], [place], [place], [place], CCET;Mary Kellie Shropshire, RN, BSN, Kela Millin, BA, ACSM CEP, Exercise Physiologist    Virtual Visit  No    Medication changes reported      No    Fall or balance concerns reported     No    Warm-up and Cool-down  Performed on first and last piece of equipment    Resistance Training Performed  Yes    VAD Patient?  No    PAD/SET Patient?  No      Pain Assessment   Currently in Pain?  No/denies          Social History   Tobacco Use  Smoking Status Former Smoker  . Packs/day: 1.00  . Years: 64.00  . Pack years: 64.00  . Types: Cigarettes  . Quit date: 08/14/2019  . Years since quitting: 0.1  Smokeless Tobacco Never Used  Tobacco Comment   patient states that he is done smoking    Goals Met:  Independence with exercise equipment Exercise tolerated well No report of cardiac concerns or symptoms Strength training completed today  Goals Unmet:  Not Applicable  Comments: Pt able to follow exercise prescription today without complaint.  Will continue to monitor for progression.    Dr. Emily Filbert is Medical Director for [place] and [facility].

## 2019-10-23 ENCOUNTER — Other Ambulatory Visit: Payer: Self-pay

## 2019-10-23 ENCOUNTER — Encounter: Payer: Medicare Other | Admitting: *Deleted

## 2019-10-23 DIAGNOSIS — J449 Chronic obstructive pulmonary disease, unspecified: Secondary | ICD-10-CM

## 2019-10-23 NOTE — Progress Notes (Signed)
Daily Session Note  Patient Details  Name: Todd GALLICK Sr. MRN: [id number] Date of Birth: 1940/12/13 Referring Provider:     Pulmonary Rehab from 10/09/2019 in [facility] Cardiac and Pulmonary Rehab  Referring Provider  Humphrey Rolls      Encounter Date: 10/23/2019  Check In: Session Check In - 10/23/19 1028      Check-In   Supervising physician immediately available to respond to emergencies  See telemetry face sheet for immediately available ER MD    Location  [facility] Pul[facility] Re[facility]    Staff Present  Renita Papa, RN BSN;Joseph Hood RCP,RRT,BSRT;Amanda [place], [place], ACSM CEP, Exercise Physiologist    Virtual Visit  No    Medication changes reported      No    Fall or balance concerns reported     No    Warm-up and Cool-down  Performed on first and last piece of equipment    Resistance Training Performed  Yes    VAD Patient?  No    PAD/SET Patient?  No      Pain Assessment   Currently in Pain?  No/denies          Social History   Tobacco Use  Smoking Status Former Smoker  . Packs/day: 1.00  . Years: 64.00  . Pack years: 64.00  . Types: Cigarettes  . Quit date: 08/14/2019  . Years since quitting: 0.1  Smokeless Tobacco Never Used  Tobacco Comment   patient states that he is done smoking    Goals Met:  Independence with exercise equipment Exercise tolerated well No report of cardiac concerns or symptoms Strength training completed today  Goals Unmet:  Not Applicable  Comments: Pt able to follow exercise prescription today without complaint.  Will continue to monitor for progression.    Dr. Emily Filbert is Medical Director for [place] and [facility].

## 2019-10-25 ENCOUNTER — Other Ambulatory Visit: Payer: Self-pay

## 2019-10-25 ENCOUNTER — Encounter: Payer: Self-pay | Admitting: *Deleted

## 2019-10-25 ENCOUNTER — Encounter: Payer: Medicare Other | Admitting: *Deleted

## 2019-10-25 DIAGNOSIS — J449 Chronic obstructive pulmonary disease, unspecified: Secondary | ICD-10-CM

## 2019-10-25 NOTE — Progress Notes (Signed)
Daily Session Note  Patient Details  Name: Todd WHITTINGHILL Sr. MRN: [id number] Date of Birth: 03-12-1941 Referring Provider:     Pulmonary Rehab from 10/09/2019 in [facility] and Pulmonary Rehab  Referring Provider  Humphrey Rolls      Encounter Date: 10/25/2019  Check In: Session Check In - 10/25/19 1033      Check-In   Supervising physician immediately available to respond to emergencies  See telemetry face sheet for immediately available ER MD    Location  [facility] Pul[facility] Re[facility]    Staff Present  Renita Papa, RN BSN;Joseph Hood RCP,RRT,BSRT;Amanda [place], [place], ACSM CEP, Exercise Physiologist    Virtual Visit  No    Medication changes reported      No    Fall or balance concerns reported     No    Warm-up and Cool-down  Performed on first and last piece of equipment    Resistance Training Performed  Yes    VAD Patient?  No    PAD/SET Patient?  No      Pain Assessment   Currently in Pain?  No/denies          Social History   Tobacco Use  Smoking Status Former Smoker  . Packs/day: 1.00  . Years: 64.00  . Pack years: 64.00  . Types: Cigarettes  . Quit date: 08/14/2019  . Years since quitting: 0.1  Smokeless Tobacco Never Used  Tobacco Comment   patient states that he is done smoking    Goals Met:  Independence with exercise equipment Exercise tolerated well No report of cardiac concerns or symptoms Strength training completed today  Goals Unmet:  Not Applicable  Comments: Pt able to follow exercise prescription today without complaint.  Will continue to monitor for progression.    Dr. Emily Filbert is Medical Director for [place] and [facility].

## 2019-10-25 NOTE — Progress Notes (Signed)
Pulmonary Individual Treatment Plan  Patient Details  Name: Todd Webster Sr. MRN: [id number] Date of Birth: 1941/02/06 Referring Provider:     Pulmonary Rehab from 10/09/2019 in [facility] Cardiac and Pulmonary Rehab  Referring Provider  Humphrey Rolls      Initial Encounter Date:    Pulmonary Rehab from 10/09/2019 in [facility] Cardiac and Pulmonary Rehab  Date  10/09/19      Visit Diagnosis: Chronic obstructive pulmonary disease, unspecified COPD type ([place])  Patient's Home Medications on Admission:  Current Outpatient Medications:  .  acetaminophen (TYLENOL) 325 MG tablet, Take 2 tablets (650 mg total) by mouth every 4 (four) hours as needed for headache or mild pain., Disp:  , Rfl:  .  Budesonide 90 MCG/ACT inhaler, Inhale 1 puff into the lungs 2 (two) times daily., Disp: 1 each, Rfl: 0 .  Ipratropium-Albuterol (COMBIVENT) 20-100 MCG/ACT AERS respimat, Inhale 1 puff into the lungs every 6 (six) hours., Disp: 4 g, Rfl: 0 .  latanoprost (XALATAN) 0.005 % ophthalmic solution, Place 1 drop into the left eye at bedtime., Disp: , Rfl:  .  Multiple Vitamins tablet, Take 1 tablet by mouth daily at 12 noon. , Disp: , Rfl:  .  SIMBRINZA 1-0.2 % SUSP, Place 1 drop into both eyes 2 (two) times daily., Disp: , Rfl:  .  vitamin B-12 (CYANOCOBALAMIN) 1000 MCG tablet, Take 1,000 mcg by mouth daily at 12 noon. , Disp: , Rfl:   Past Medical History: Past Medical History:  Diagnosis Date  . COPD (chronic obstructive pulmonary disease) (HCC)     Tobacco Use: Social History   Tobacco Use  Smoking Status Former Smoker  . Packs/day: 1.00  . Years: 64.00  . Pack years: 64.00  . Types: Cigarettes  . Quit date: 08/14/2019  . Years since quitting: 0.1  Smokeless Tobacco Never Used  Tobacco Comment   patient states that he is done smoking    Labs: Recent Review Flowsheet Data    There is no flowsheet data to display.       Pulmonary Assessment Scores: Pulmonary Assessment Scores    Row Name 10/09/19 1428          ADL UCSD   SOB Score total  37     Rest  0     Walk  1     Stairs  3     Bath  2     Dress  2     Shop  3       CAT Score   CAT Score  19       mMRC Score   mMRC Score  3        UCSD: Self-administered rating of dyspnea associated with activities of daily living (ADLs) 6-point scale (0 = "not at all" to 5 = "maximal or unable to do because of breathlessness")  Scoring Scores range from 0 to 120.  Minimally important difference is 5 units  CAT: CAT can identify the health impairment of COPD patients and is better correlated with disease progression.  CAT has a scoring range of zero to 40. The CAT score is classified into four groups of low (less than 10), medium (10 - 20), high (21-30) and very high (31-40) based on the impact level of disease on health status. A CAT score over 10 suggests significant symptoms.  A worsening CAT score could be explained by an exacerbation, poor medication adherence, poor inhaler technique, or progression of COPD or comorbid conditions.  CAT  MCID is 2 points  mMRC: mMRC (Modified Medical Research Council) Dyspnea Scale is used to assess the degree of baseline functional disability in patients of respiratory disease due to dyspnea. No minimal important difference is established. A decrease in score of 1 point or greater is considered a positive change.   Pulmonary Function Assessment: Pulmonary Function Assessment - 10/05/19 1400      Breath   Shortness of Breath  Yes;Limiting activity       Exercise Target Goals: Exercise Program Goal: Individual exercise prescription set using results from initial 6 min walk test and THRR while considering  patient's activity barriers and safety.   Exercise Prescription Goal: Initial exercise prescription builds to 30-45 minutes a day of aerobic activity, 2-3 days per week.  Home exercise guidelines will be given to patient during program as part of exercise prescription that the participant will  acknowledge.  Activity Barriers & Risk Stratification:   6 Minute Walk: 6 Minute Walk    Row Name 10/09/19 1407         6 Minute Walk   Phase  Initial     Distance  740 feet     Walk Time  5 minutes     # of Rest Breaks  1     MPH  1.68     METS  2.35     RPE  13     Perceived Dyspnea   3     VO2 Peak  8.2     Symptoms  Yes (comment)     Comments  shortness of breath     Resting HR  83 bpm     Resting BP  140/60     Resting Oxygen Saturation   96 %     Exercise Oxygen Saturation  during 6 min walk  91 %     Max Ex. HR  91 bpm     Max Ex. BP  158/50     2 Minute Post BP  132/64       Interval HR   1 Minute HR  87     2 Minute HR  89     3 Minute HR  85     4 Minute HR  71     5 Minute HR  74     6 Minute HR  91     2 Minute Post HR  99     Interval Heart Rate?  Yes       Interval Oxygen   Interval Oxygen?  Yes     Baseline Oxygen Saturation %  96 %     1 Minute Oxygen Saturation %  91 %     1 Minute Liters of Oxygen  0 L     2 Minute Oxygen Saturation %  94 %     2 Minute Liters of Oxygen  0 L     3 Minute Oxygen Saturation %  95 %     3 Minute Liters of Oxygen  0 L     4 Minute Oxygen Saturation %  97 %     4 Minute Liters of Oxygen  0 L     5 Minute Oxygen Saturation %  96 %     5 Minute Liters of Oxygen  0 L     6 Minute Oxygen Saturation %  95 %     6 Minute Liters of Oxygen  0 L     2 Minute Post Oxygen  Saturation %  98 %     2 Minute Post Liters of Oxygen  0 L       Oxygen Initial Assessment: Oxygen Initial Assessment - 10/05/19 1400      Home Oxygen   Home Oxygen Device  None    Sleep Oxygen Prescription  None    Home Exercise Oxygen Prescription  None    Home at Rest Exercise Oxygen Prescription  None      Initial 6 min Walk   Oxygen Used  None      Program Oxygen Prescription   Program Oxygen Prescription  None      Intervention   Short Term Goals  To learn and understand importance of monitoring SPO2 with pulse oximeter and  demonstrate accurate use of the pulse oximeter.;To learn and understand importance of maintaining oxygen saturations>88%;To learn and demonstrate proper pursed lip breathing techniques or other breathing techniques.;To learn and demonstrate proper use of respiratory medications    Long  Term Goals  Verbalizes importance of monitoring SPO2 with pulse oximeter and return demonstration;Maintenance of O2 saturations>88%;Exhibits proper breathing techniques, such as pursed lip breathing or other method taught during program session;Compliance with respiratory medication;Demonstrates proper use of MDI's       Oxygen Re-Evaluation: Oxygen Re-Evaluation    Row Name 10/16/19 1029             Program Oxygen Prescription   Program Oxygen Prescription  None         Home Oxygen   Home Oxygen Device  None       Sleep Oxygen Prescription  None       Home Exercise Oxygen Prescription  None       Home at Rest Exercise Oxygen Prescription  None         Goals/Expected Outcomes   Short Term Goals  To learn and understand importance of monitoring SPO2 with pulse oximeter and demonstrate accurate use of the pulse oximeter.;To learn and understand importance of maintaining oxygen saturations>88%;To learn and demonstrate proper pursed lip breathing techniques or other breathing techniques.;To learn and demonstrate proper use of respiratory medications       Long  Term Goals  Verbalizes importance of monitoring SPO2 with pulse oximeter and return demonstration;Maintenance of O2 saturations>88%;Exhibits proper breathing techniques, such as pursed lip breathing or other method taught during program session;Compliance with respiratory medication;Demonstrates proper use of MDI's       Comments  Reviewed PLB technique with pt.  Talked about how it works and it's importance in maintaining their exercise saturations.       Goals/Expected Outcomes  Short: Become more profiecient at using PLB.   Long: Become independent  at using PLB.          Oxygen Discharge (Final Oxygen Re-Evaluation): Oxygen Re-Evaluation - 10/16/19 1029      Program Oxygen Prescription   Program Oxygen Prescription  None      Home Oxygen   Home Oxygen Device  None    Sleep Oxygen Prescription  None    Home Exercise Oxygen Prescription  None    Home at Rest Exercise Oxygen Prescription  None      Goals/Expected Outcomes   Short Term Goals  To learn and understand importance of monitoring SPO2 with pulse oximeter and demonstrate accurate use of the pulse oximeter.;To learn and understand importance of maintaining oxygen saturations>88%;To learn and demonstrate proper pursed lip breathing techniques or other breathing techniques.;To learn and demonstrate proper use of respiratory medications  Long  Term Goals  Verbalizes importance of monitoring SPO2 with pulse oximeter and return demonstration;Maintenance of O2 saturations>88%;Exhibits proper breathing techniques, such as pursed lip breathing or other method taught during program session;Compliance with respiratory medication;Demonstrates proper use of MDI's    Comments  Reviewed PLB technique with pt.  Talked about how it works and it's importance in maintaining their exercise saturations.    Goals/Expected Outcomes  Short: Become more profiecient at using PLB.   Long: Become independent at using PLB.       Initial Exercise Prescription: Initial Exercise Prescription - 10/09/19 1400      Date of Initial Exercise RX and Referring Provider   Date  10/09/19    Referring Provider  Humphrey Rolls      Treadmill   MPH  1.6    Grade  0.5    Minutes  15    METs  2.3      NuStep   Level  2    SPM  80    Minutes  15    METs  2.3      Biostep-RELP   Level  2    SPM  50    Minutes  15    METs  2      Prescription Details   Frequency (times per week)  3    Duration  Progress to 30 minutes of continuous aerobic without signs/symptoms of physical distress      Intensity   THRR  40-80% of Max Heartrate  107-131    Ratings of Perceived Exertion  11-13    Perceived Dyspnea  0-4      Resistance Training   Training Prescription  Yes    Weight  3 lb    Reps  10-15       Perform Capillary Blood Glucose checks as needed.  Exercise Prescription Changes: Exercise Prescription Changes    Row Name 10/18/19 1300             Response to Exercise   Blood Pressure (Admit)  104/60       Blood Pressure (Exercise)  138/58       Blood Pressure (Exit)  110/60       Heart Rate (Admit)  71 bpm       Heart Rate (Exercise)  74 bpm       Heart Rate (Exit)  74 bpm       Oxygen Saturation (Admit)  98 %       Oxygen Saturation (Exercise)  99 %       Oxygen Saturation (Exit)  97 %       Rating of Perceived Exertion (Exercise)  15       Perceived Dyspnea (Exercise)  0       Symptoms  none       Comments  first full day of exercise       Duration  Progress to 30 minutes of  aerobic without signs/symptoms of physical distress       Intensity  THRR unchanged         Progression   Progression  Continue to progress workloads to maintain intensity without signs/symptoms of physical distress.       Average METs  2.15         Resistance Training   Training Prescription  Yes       Weight  3 lb       Reps  10-15  Interval Training   Interval Training  No         Treadmill   MPH  0.7       Grade  0       Minutes  15       METs  1.5         NuStep   Level  2       Minutes  15       METs  2.8          Exercise Comments:   Exercise Goals and Review: Exercise Goals    Row Name 10/09/19 1423             Exercise Goals   Increase Physical Activity  Yes       Intervention  Provide advice, education, support and counseling about physical activity/exercise needs.;Develop an individualized exercise prescription for aerobic and resistive training based on initial evaluation findings, risk stratification, comorbidities and participant's personal goals.        Expected Outcomes  Short Term: Attend rehab on a regular basis to increase amount of physical activity.;Long Term: Add in home exercise to make exercise part of routine and to increase amount of physical activity.;Long Term: Exercising regularly at least 3-5 days a week.       Increase Strength and Stamina  Yes       Intervention  Provide advice, education, support and counseling about physical activity/exercise needs.;Develop an individualized exercise prescription for aerobic and resistive training based on initial evaluation findings, risk stratification, comorbidities and participant's personal goals.       Expected Outcomes  Short Term: Increase workloads from initial exercise prescription for resistance, speed, and METs.;Short Term: Perform resistance training exercises routinely during rehab and add in resistance training at home;Long Term: Improve cardiorespiratory fitness, muscular endurance and strength as measured by increased METs and functional capacity (6MWT)       Able to understand and use rate of perceived exertion (RPE) scale  Yes       Intervention  Provide education and explanation on how to use RPE scale       Expected Outcomes  Short Term: Able to use RPE daily in rehab to express subjective intensity level;Long Term:  Able to use RPE to guide intensity level when exercising independently       Able to understand and use Dyspnea scale  Yes       Intervention  Provide education and explanation on how to use Dyspnea scale       Expected Outcomes  Short Term: Able to use Dyspnea scale daily in rehab to express subjective sense of shortness of breath during exertion;Long Term: Able to use Dyspnea scale to guide intensity level when exercising independently       Knowledge and understanding of Target Heart Rate Range (THRR)  Yes       Intervention  Provide education and explanation of THRR including how the numbers were predicted and where they are located for reference       Expected  Outcomes  Short Term: Able to state/look up THRR;Short Term: Able to use daily as guideline for intensity in rehab;Long Term: Able to use THRR to govern intensity when exercising independently       Able to check pulse independently  Yes       Intervention  Provide education and demonstration on how to check pulse in carotid and radial arteries.;Review the importance of being able to check your own pulse for  safety during independent exercise       Expected Outcomes  Short Term: Able to explain why pulse checking is important during independent exercise;Long Term: Able to check pulse independently and accurately       Understanding of Exercise Prescription  Yes       Intervention  Provide education, explanation, and written materials on patient's individual exercise prescription       Expected Outcomes  Short Term: Able to explain program exercise prescription;Long Term: Able to explain home exercise prescription to exercise independently          Exercise Goals Re-Evaluation : Exercise Goals Re-Evaluation    Row Name 10/16/19 1026             Exercise Goal Re-Evaluation   Exercise Goals Review  Increase Physical Activity;Able to understand and use rate of perceived exertion (RPE) scale;Knowledge and understanding of Target Heart Rate Range (THRR);Understanding of Exercise Prescription;Able to understand and use Dyspnea scale;Increase Strength and Stamina;Able to check pulse independently       Comments  Reviewed RPE scale, THR and program prescription with pt today.  Pt voiced understanding and was given a copy of goals to take home.       Expected Outcomes  Short: Use RPE daily to regulate intensity. Long: Follow program prescription in THR.          Discharge Exercise Prescription (Final Exercise Prescription Changes): Exercise Prescription Changes - 10/18/19 1300      Response to Exercise   Blood Pressure (Admit)  104/60    Blood Pressure (Exercise)  138/58    Blood Pressure (Exit)   110/60    Heart Rate (Admit)  71 bpm    Heart Rate (Exercise)  74 bpm    Heart Rate (Exit)  74 bpm    Oxygen Saturation (Admit)  98 %    Oxygen Saturation (Exercise)  99 %    Oxygen Saturation (Exit)  97 %    Rating of Perceived Exertion (Exercise)  15    Perceived Dyspnea (Exercise)  0    Symptoms  none    Comments  first full day of exercise    Duration  Progress to 30 minutes of  aerobic without signs/symptoms of physical distress    Intensity  THRR unchanged      Progression   Progression  Continue to progress workloads to maintain intensity without signs/symptoms of physical distress.    Average METs  2.15      Resistance Training   Training Prescription  Yes    Weight  3 lb    Reps  10-15      Interval Training   Interval Training  No      Treadmill   MPH  0.7    Grade  0    Minutes  15    METs  1.5      NuStep   Level  2    Minutes  15    METs  2.8       Nutrition:  Target Goals: Understanding of nutrition guidelines, daily intake of sodium <158m, cholesterol <2065m calories 30% from fat and 7% or less from saturated fats, daily to have 5 or more servings of fruits and vegetables.  Biometrics:    Nutrition Therapy Plan and Nutrition Goals:   Nutrition Assessments: Nutrition Assessments - 10/09/19 1429      MEDFICTS Scores   Pre Score  12       Nutrition Goals Re-Evaluation:  Nutrition Goals Discharge (Final Nutrition Goals Re-Evaluation):   Psychosocial: Target Goals: Acknowledge presence or absence of significant depression and/or stress, maximize coping skills, provide positive support system. Participant is able to verbalize types and ability to use techniques and skills needed for reducing stress and depression.   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      Family Dynamics   Good Support System?  Yes    Comments  He can look to his wife, son and neighbor.      Barriers   Psychosocial barriers to participate in program  There are no identifiable barriers or psychosocial needs.;The patient should benefit from training in stress management and relaxation.      Screening Interventions   Interventions  Encouraged to exercise;To provide support and resources with identified psychosocial needs;Provide feedback about the scores to participant    Expected Outcomes  Short Term goal: Utilizing psychosocial counselor, staff and physician to assist with identification of specific Stressors or current issues interfering with healing process. Setting desired goal for each stressor or current issue identified.;Long Term Goal: Stressors or current issues are controlled or eliminated.;Short Term goal: Identification and review with participant of any Quality of Life or Depression concerns found by scoring the questionnaire.;Long Term goal: The participant improves quality of Life and PHQ9 Scores as seen by post scores and/or verbalization of changes       Quality of Life Scores:  Scores of 19 and below usually indicate a poorer quality of life in these areas.  A difference of  2-3 points is a clinically meaningful difference.  A difference of 2-3 points in the total score of the Quality of Life Index has been associated with significant improvement in overall quality of life, self-image, physical symptoms, and general health in studies assessing change in quality of life.  PHQ-9: Recent Review Flowsheet Data    Depression screen [facility] 2/9 10/09/2019   Decreased Interest 0   Down, Depressed, Hopeless 0   PHQ - 2 Score 0   Altered sleeping 0   Tired, decreased energy 0   Change in appetite 0   Feeling bad or failure about yourself  0   Trouble concentrating 0   Moving slowly or fidgety/restless 0    Suicidal thoughts 0   PHQ-9 Score 0     Interpretation of Total Score  Total Score Depression Severity:  1-4 = Minimal depression, 5-9 = Mild depression, 10-14 = Moderate depression, 15-19 = Moderately severe depression, 20-27 = Severe depression   Psychosocial Evaluation and Intervention: Psychosocial Evaluation - 10/05/19 1408      Psychosocial Evaluation & Interventions   Interventions  Encouraged to exercise with the program and follow exercise prescription    Comments  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 He has a good family support system.    Expected Outcomes  Short: Attend [facility] stress management education to decrease stress. Long: Maintain exercise Post [facility] to keep stress at a minimum.    Continue Psychosocial Services   Follow up required by staff       Psychosocial Re-Evaluation:   Psychosocial Discharge (Final Psychosocial Re-Evaluation):   Education: Education Goals: Education classes will be  provided on a weekly basis, covering required topics. Participant will state understanding/return demonstration of topics presented.  Learning Barriers/Preferences: Learning Barriers/Preferences - 10/05/19 1402      Learning Barriers/Preferences   Learning Barriers  Sight    Learning Preferences  None       Education Topics:  Initial Evaluation Education: - Verbal, written and demonstration of respiratory meds, oximetry and breathing techniques. Instruction on use of nebulizers and MDIs and importance of monitoring MDI activations.   General Nutrition Guidelines/Fats and Fiber: -Group instruction provided by verbal, written material, models and posters to present the general guidelines for heart healthy nutrition. Gives an explanation and review of dietary fats and fiber.   Controlling Sodium/Reading Food Labels: -Group verbal and written material supporting the discussion of sodium use in heart healthy nutrition. Review and  explanation with models, verbal and written materials for utilization of the food label.   Exercise Physiology & General Exercise Guidelines: - Group verbal and written instruction with models to review the exercise physiology of the cardiovascular system and associated critical values. Provides general exercise guidelines with specific guidelines to those with heart or lung disease.    Aerobic Exercise & Resistance Training: - Gives group verbal and written instruction on the various components of exercise. Focuses on aerobic and resistive training programs and the benefits of this training and how to safely progress through these programs.   Flexibility, Balance, Mind/Body Relaxation: Provides group verbal/written instruction on the benefits of flexibility and balance training, including mind/body exercise modes such as yoga, pilates and tai chi.  Demonstration and skill practice provided.   Stress and Anxiety: - Provides group verbal and written instruction about the health risks of elevated stress and causes of high stress.  Discuss the correlation between heart/lung disease and anxiety and treatment options. Review healthy ways to manage with stress and anxiety.   Depression: - Provides group verbal and written instruction on the correlation between heart/lung disease and depressed mood, treatment options, and the stigmas associated with seeking treatment.   Exercise & Equipment Safety: - Individual verbal instruction and demonstration of equipment use and safety with use of the equipment.   Pulmonary Rehab from 10/09/2019 in [facility] Cardiac and Pulmonary Rehab  Date  10/09/19  Educator  AS  Instruction Review Code  1- Verbalizes Understanding      Infection Prevention: - Provides verbal and written material to individual with discussion of infection control including proper hand washing and proper equipment cleaning during exercise session.   Pulmonary Rehab from 10/09/2019 in [facility]  Cardiac and Pulmonary Rehab  Date  10/09/19  Educator  AS  Instruction Review Code  1- Verbalizes Understanding      Falls Prevention: - Provides verbal and written material to individual with discussion of falls prevention and safety.   Pulmonary Rehab from 10/09/2019 in [facility] Cardiac and Pulmonary Rehab  Date  10/09/19  Educator  AS  Instruction Review Code  1- Verbalizes Understanding      Diabetes: - Individual verbal and written instruction to review signs/symptoms of diabetes, desired ranges of glucose level fasting, after meals and with exercise. Advice that pre and post exercise glucose checks will be done for 3 sessions at entry of program.   Chronic Lung Diseases: - Group verbal and written instruction to review updates, respiratory medications, advancements in procedures and treatments. Discuss use of supplemental oxygen including available portable oxygen systems, continuous and intermittent flow rates, concentrators, personal use and safety guidelines. Review proper use of inhaler  and spacers. Provide informative websites for self-education.    Energy Conservation: - Provide group verbal and written instruction for methods to conserve energy, plan and organize activities. Instruct on pacing techniques, use of adaptive equipment and posture/positioning to relieve shortness of breath.   Triggers and Exacerbations: - Group verbal and written instruction to review types of environmental triggers and ways to prevent exacerbations. Discuss weather changes, air quality and the benefits of nasal washing. Review warning signs and symptoms to help prevent infections. Discuss techniques for effective airway clearance, coughing, and vibrations.   AED/CPR: - Group verbal and written instruction with the use of models to demonstrate the basic use of the AED with the basic ABC's of resuscitation.   Anatomy and Physiology of the Lungs: - Group verbal and written instruction with the  use of models to provide basic lung anatomy and physiology related to function, structure and complications of lung disease.   Anatomy & Physiology of the Heart: - Group verbal and written instruction and models provide basic cardiac anatomy and physiology, with the coronary electrical and arterial systems. Review of Valvular disease and Heart Failure   Cardiac Medications: - Group verbal and written instruction to review commonly prescribed medications for heart disease. Reviews the medication, class of the drug, and side effects.   Know Your Numbers and Risk Factors: -Group verbal and written instruction about important numbers in your health.  Discussion of what are risk factors and how they play a role in the disease process.  Review of Cholesterol, Blood Pressure, Diabetes, and BMI and the role they play in your overall health.   Sleep Hygiene: -Provides group verbal and written instruction about how sleep can affect your health.  Define sleep hygiene, discuss sleep cycles and impact of sleep habits. Review good sleep hygiene tips.    Other: -Provides group and verbal instruction on various topics (see comments)    Knowledge Questionnaire Score: Knowledge Questionnaire Score - 10/16/19 1104      Knowledge Questionnaire Score   Pre Score  went over correct answers        Core Components/Risk Factors/Patient Goals at Admission: Personal Goals and Risk Factors at Admission - 10/09/19 1421      Core Components/Risk Factors/Patient Goals on Admission    Weight Management  Yes;Weight Gain    Intervention  Weight Management: Develop a combined nutrition and exercise program designed to reach desired caloric intake, while maintaining appropriate intake of nutrient and fiber, sodium and fats, and appropriate energy expenditure required for the weight goal.;Weight Management: Provide education and appropriate resources to help participant work on and attain dietary goals.;Weight  Management/Obesity: Establish reasonable short term and long term weight goals.    Admit Weight  133 lb (60.3 kg)    Goal Weight: Short Term  143 lb (64.9 kg)    Goal Weight: Long Term  150 lb (68 kg)    Expected Outcomes  Short Term: Continue to assess and modify interventions until short term weight is achieved;Long Term: Adherence to nutrition and physical activity/exercise program aimed toward attainment of established weight goal;Understanding recommendations for meals to include 15-35% energy as protein, 25-35% energy from fat, 35-60% energy from carbohydrates, less than 279m of dietary cholesterol, 20-35 gm of total fiber daily;Understanding of distribution of calorie intake throughout the day with the consumption of 4-5 meals/snacks    Number of packs per day  Patient has quit in January. former 1 pack a day    Intervention  Assist the participant  in steps to quit. Provide individualized education and counseling about committing to Tobacco Cessation, relapse prevention, and pharmacological support that can be provided by physician.;Advice worker, assist with locating and accessing local/national Quit Smoking programs, and support quit date choice.    Expected Outcomes  Short Term: Will demonstrate readiness to quit, by selecting a quit date.;Long Term: Complete abstinence from all tobacco products for at least 12 months from quit date.;Short Term: Will quit all tobacco product use, adhering to prevention of relapse plan.    Intervention  Provide education, individualized exercise plan and daily activity instruction to help decrease symptoms of SOB with activities of daily living.    Expected Outcomes  Short Term: Improve cardiorespiratory fitness to achieve a reduction of symptoms when performing ADLs;Long Term: Be able to perform more ADLs without symptoms or delay the onset of symptoms    Heart Failure  Yes    Intervention  Provide a combined exercise and nutrition program that  is supplemented with education, support and counseling about heart failure. Directed toward relieving symptoms such as shortness of breath, decreased exercise tolerance, and extremity edema.    Expected Outcomes  Improve functional capacity of life;Short term: Attendance in program 2-3 days a week with increased exercise capacity. Reported lower sodium intake. Reported increased fruit and vegetable intake. Reports medication compliance.;Short term: Daily weights obtained and reported for increase. Utilizing diuretic protocols set by physician.;Long term: Adoption of self-care skills and reduction of barriers for early signs and symptoms recognition and intervention leading to self-care maintenance.       Core Components/Risk Factors/Patient Goals Review:    Core Components/Risk Factors/Patient Goals at Discharge (Final Review):    ITP Comments: ITP Comments    Row Name 10/16/19 1024 10/25/19 1029         ITP Comments  First full day of exercise!  Patient was oriented to gym and equipment including functions, settings, policies, and procedures.  Patient's individual exercise prescription and treatment plan were reviewed.  All starting workloads were established based on the results of the 6 minute walk test done at initial orientation visit.  The plan for exercise progression was also introduced and progression will be customized based on patient's performance and goals.  30 day chart review completed. ITP sent to Dr Zachery Dakins Medical Director, for review,changes as needed and signature.  New to program         Comments:

## 2019-10-30 ENCOUNTER — Encounter: Payer: Medicare Other | Admitting: *Deleted

## 2019-10-30 ENCOUNTER — Other Ambulatory Visit: Payer: Self-pay

## 2019-10-30 DIAGNOSIS — J449 Chronic obstructive pulmonary disease, unspecified: Secondary | ICD-10-CM | POA: Diagnosis not present

## 2019-10-30 NOTE — Progress Notes (Signed)
Daily Session Note  Patient Details  Name: Todd DERKS Sr. MRN: [id number] Date of Birth: 1941-04-06 Referring Provider:     Pulmonary Rehab from 10/09/2019 in [facility] Cardiac and Pulmonary Rehab  Referring Provider  Humphrey Rolls      Encounter Date: 10/30/2019  Check In: Session Check In - 10/30/19 1022      Check-In   Supervising physician immediately available to respond to emergencies  See telemetry face sheet for immediately available ER MD    Location  [facility]-Cardiac & Pulmonary Rehab    Staff Present  Renita Papa, RN BSN;Joseph [address] [place], [place], ACSM CEP, Exercise Physiologist    Virtual Visit  No    Medication changes reported      No    Fall or balance concerns reported     No    Warm-up and Cool-down  Performed on first and last piece of equipment    Resistance Training Performed  Yes    VAD Patient?  No    PAD/SET Patient?  No      Pain Assessment   Currently in Pain?  No/denies          Social History   Tobacco Use  Smoking Status Former Smoker  . Packs/day: 1.00  . Years: 64.00  . Pack years: 64.00  . Types: Cigarettes  . Quit date: 08/14/2019  . Years since quitting: 0.2  Smokeless Tobacco Never Used  Tobacco Comment   patient states that he is done smoking    Goals Met:  Independence with exercise equipment Exercise tolerated well No report of cardiac concerns or symptoms Strength training completed today  Goals Unmet:  Not Applicable  Comments: Pt able to follow exercise prescription today without complaint.  Will continue to monitor for progression.    Dr. Emily Filbert is Medical Director for [place] and [facility].

## 2019-11-01 ENCOUNTER — Encounter: Payer: Medicare Other | Admitting: *Deleted

## 2019-11-01 ENCOUNTER — Other Ambulatory Visit: Payer: Self-pay

## 2019-11-01 DIAGNOSIS — J449 Chronic obstructive pulmonary disease, unspecified: Secondary | ICD-10-CM

## 2019-11-01 NOTE — Progress Notes (Signed)
Daily Session Note  Patient Details  Name: Todd Webster Sr. MRN: [id number] Date of Birth: September 09, 1940 Referring Provider:     Pulmonary Rehab from 10/09/2019 in [facility] Cardiac and Pulmonary Rehab  Referring Provider  Humphrey Rolls      Encounter Date: 11/01/2019  Check In: Session Check In - 11/01/19 1044      Check-In   Supervising physician immediately available to respond to emergencies  See telemetry face sheet for immediately available ER MD    Location  [facility]-Cardiac & Pulmonary Rehab    Staff Present  Renita Papa, RN BSN;Joseph Hood RCP,RRT,BSRT;Melissa Alda RDN, LDN    Virtual Visit  No    Medication changes reported      No    Fall or balance concerns reported     No    Warm-up and Cool-down  Performed on first and last piece of equipment    Resistance Training Performed  Yes    VAD Patient?  No    PAD/SET Patient?  No      Pain Assessment   Currently in Pain?  No/denies          Social History   Tobacco Use  Smoking Status Former Smoker  . Packs/day: 1.00  . Years: 64.00  . Pack years: 64.00  . Types: Cigarettes  . Quit date: 08/14/2019  . Years since quitting: 0.2  Smokeless Tobacco Never Used  Tobacco Comment   patient states that he is done smoking    Goals Met:  Independence with exercise equipment Exercise tolerated well No report of cardiac concerns or symptoms Strength training completed today  Goals Unmet:  Not Applicable  Comments: Pt able to follow exercise prescription today without complaint.  Will continue to monitor for progression.    Dr. Emily Filbert is Medical Director for [place] and [facility].

## 2019-11-06 ENCOUNTER — Encounter: Payer: Medicare Other | Attending: Cardiovascular Disease

## 2019-11-06 DIAGNOSIS — J449 Chronic obstructive pulmonary disease, unspecified: Secondary | ICD-10-CM | POA: Insufficient documentation

## 2019-11-06 DIAGNOSIS — Z87891 Personal history of nicotine dependence: Secondary | ICD-10-CM | POA: Insufficient documentation

## 2019-11-06 DIAGNOSIS — Z7951 Long term (current) use of inhaled steroids: Secondary | ICD-10-CM | POA: Insufficient documentation

## 2019-11-06 DIAGNOSIS — Z79899 Other long term (current) drug therapy: Secondary | ICD-10-CM | POA: Insufficient documentation

## 2019-11-08 ENCOUNTER — Encounter: Payer: Medicare Other | Admitting: *Deleted

## 2019-11-08 ENCOUNTER — Other Ambulatory Visit: Payer: Self-pay

## 2019-11-08 DIAGNOSIS — Z7951 Long term (current) use of inhaled steroids: Secondary | ICD-10-CM | POA: Diagnosis not present

## 2019-11-08 DIAGNOSIS — Z79899 Other long term (current) drug therapy: Secondary | ICD-10-CM | POA: Diagnosis not present

## 2019-11-08 DIAGNOSIS — Z87891 Personal history of nicotine dependence: Secondary | ICD-10-CM | POA: Diagnosis not present

## 2019-11-08 DIAGNOSIS — J449 Chronic obstructive pulmonary disease, unspecified: Secondary | ICD-10-CM

## 2019-11-08 NOTE — Progress Notes (Addendum)
Daily Session Note  Patient Details  Name: Todd NHAN Sr. MRN: [id number] Date of Birth: August 05, 1940 Referring Provider:     Pulmonary Rehab from 10/09/2019 in [facility] Cardiac and Pulmonary Rehab  Referring Provider  Humphrey Rolls      Encounter Date: 11/08/2019  Check In: Session Check In - 11/08/19 1016      Check-In   Supervising physician immediately available to respond to emergencies  See telemetry face sheet for immediately available ER MD    Location  [facility]-Cardiac & Pulmonary Rehab    Staff Present  Nyoka Cowden, RN, BSN, MA;Susanne Bice, RN, BSN, CCRP    Virtual Visit  No    Medication changes reported      Yes    Comments  Started 40 mg pantoprazol    Fall or balance concerns reported     No    Warm-up and Cool-down  Performed on first and last piece of equipment    Resistance Training Performed  Yes    PAD/SET Patient?  No      Pain Assessment   Currently in Pain?  No/denies          Social History   Tobacco Use  Smoking Status Former Smoker  . Packs/day: 1.00  . Years: 64.00  . Pack years: 64.00  . Types: Cigarettes  . Quit date: 08/14/2019  . Years since quitting: 0.2  Smokeless Tobacco Never Used  Tobacco Comment   patient states that he is done smoking    Goals Met:  Independence with exercise equipment Exercise tolerated well No report of cardiac concerns or symptoms  Goals Unmet:  Not Applicable  Comments: Pt able to follow exercise prescription today without complaint.  Will continue to monitor for progression.   Dr. Emily Filbert is Medical Director for [place] and [facility].

## 2019-11-10 ENCOUNTER — Encounter: Payer: Medicare Other | Admitting: *Deleted

## 2019-11-10 ENCOUNTER — Other Ambulatory Visit: Payer: Self-pay

## 2019-11-10 DIAGNOSIS — J449 Chronic obstructive pulmonary disease, unspecified: Secondary | ICD-10-CM | POA: Diagnosis not present

## 2019-11-10 NOTE — Progress Notes (Signed)
Daily Session Note  Patient Details  Name: Todd Webster. MRN: [id number] Date of Birth: 1941-07-28 Referring Provider:     Pulmonary Rehab from 10/09/2019 in [facility] Cardiac and Pulmonary Rehab  Referring Provider  Humphrey Rolls      Encounter Date: 11/10/2019  Check In: Session Check In - 11/10/19 1004      Check-In   Supervising physician immediately available to respond to emergencies  See telemetry face sheet for immediately available ER MD    Location  [facility]-Cardiac & Pulmonary Rehab    Staff Present  Renita Papa, RN BSN;Joseph Hood RCP,RRT,BSRT;Amanda [place], [place], ACSM CEP, Exercise Physiologist    Virtual Visit  No    Medication changes reported      No    Fall or balance concerns reported     No    Warm-up and Cool-down  Performed on first and last piece of equipment    Resistance Training Performed  Yes    VAD Patient?  No    PAD/SET Patient?  No      Pain Assessment   Currently in Pain?  No/denies          Social History   Tobacco Use  Smoking Status Former Smoker  . Packs/day: 1.00  . Years: 64.00  . Pack years: 64.00  . Types: Cigarettes  . Quit date: 08/14/2019  . Years since quitting: 0.2  Smokeless Tobacco Never Used  Tobacco Comment   patient states that he is done smoking    Goals Met:  Independence with exercise equipment Exercise tolerated well No report of cardiac concerns or symptoms Strength training completed today  Goals Unmet:  Not Applicable  Comments: Pt able to follow exercise prescription today without complaint.  Will continue to monitor for progression.    Dr. Emily Filbert is Medical Director for [place] and [facility].

## 2019-11-15 ENCOUNTER — Other Ambulatory Visit: Payer: Self-pay

## 2019-11-15 ENCOUNTER — Encounter: Payer: Medicare Other | Admitting: *Deleted

## 2019-11-15 DIAGNOSIS — J449 Chronic obstructive pulmonary disease, unspecified: Secondary | ICD-10-CM | POA: Diagnosis not present

## 2019-11-15 NOTE — Progress Notes (Signed)
Daily Session Note  Patient Details  Name: Todd Webster. MRN: [id number] Date of Birth: [birth year] Referring Provider:     Pulmonary Rehab from 10/09/2019 in [facility] Cardiac and Pulmonary Rehab  Referring Provider  Humphrey Rolls      Encounter Date: 11/15/2019  Check In: Session Check In - 11/15/19 1014      Check-In   Supervising physician immediately available to respond to emergencies  See telemetry face sheet for immediately available ER MD    Location  [facility]-Cardiac & Pulmonary Rehab    Staff Present  Renita Papa, RN BSN;Joseph Hood RCP,RRT,BSRT;Amanda [place], [place], ACSM CEP, Exercise Physiologist    Virtual Visit  No    Medication changes reported      No    Fall or balance concerns reported     No    Warm-up and Cool-down  Performed on first and last piece of equipment    Resistance Training Performed  Yes    VAD Patient?  No    PAD/SET Patient?  No      Pain Assessment   Currently in Pain?  No/denies          Social History   Tobacco Use  Smoking Status Former Smoker  . Packs/day: 1.00  . Years: 64.00  . Pack years: 64.00  . Types: Cigarettes  . Quit date: 08/14/2019  . Years since quitting: 0.2  Smokeless Tobacco Never Used  Tobacco Comment   patient states that he is done smoking    Goals Met:  Independence with exercise equipment Exercise tolerated well No report of cardiac concerns or symptoms Strength training completed today  Goals Unmet:  Not Applicable  Comments: Pt able to follow exercise prescription today without complaint.  Will continue to monitor for progression.    Dr. Emily Filbert is Medical Director for [place] and [facility].

## 2019-11-17 ENCOUNTER — Other Ambulatory Visit: Payer: Self-pay

## 2019-11-17 DIAGNOSIS — J449 Chronic obstructive pulmonary disease, unspecified: Secondary | ICD-10-CM | POA: Diagnosis not present

## 2019-11-17 NOTE — Progress Notes (Signed)
Daily Session Note  Patient Details  Name: Todd WILLEMS Sr. MRN: [id number] Date of Birth: [birth year] Referring Provider:     Pulmonary Rehab from 10/09/2019 in [facility] Cardiac and Pulmonary Rehab  Referring Provider  Humphrey Rolls      Encounter Date: 11/17/2019  Check In: Session Check In - 11/17/19 1026      Check-In   Supervising physician immediately available to respond to emergencies  See telemetry face sheet for immediately available ER MD    Location  [facility]-Cardiac & Pulmonary Rehab    Staff Present  Vida Rigger RN, Vickki Hearing, BA, ACSM CEP, Exercise Physiologist;Joseph Tessie Fass RCP,RRT,BSRT    Virtual Visit  No    Medication changes reported      No    Fall or balance concerns reported     No    Warm-up and Cool-down  Performed on first and last piece of equipment    Resistance Training Performed  Yes    VAD Patient?  No    PAD/SET Patient?  No      Pain Assessment   Currently in Pain?  No/denies          Social History   Tobacco Use  Smoking Status Former Smoker  . Packs/day: 1.00  . Years: 64.00  . Pack years: 64.00  . Types: Cigarettes  . Quit date: 08/14/2019  . Years since quitting: 0.2  Smokeless Tobacco Never Used  Tobacco Comment   patient states that he is done smoking    Goals Met:  Proper associated with RPD/PD & O2 Sat Independence with exercise equipment Using PLB without cueing & demonstrates good technique Exercise tolerated well No report of cardiac concerns or symptoms Strength training completed today  Goals Unmet:  Not Applicable  Comments: Pt able to follow exercise prescription today without complaint.  Will continue to monitor for progression.   Dr. Emily Filbert is Medical Director for [place] and [facility].

## 2019-11-20 ENCOUNTER — Encounter: Payer: Medicare Other | Admitting: *Deleted

## 2019-11-20 ENCOUNTER — Other Ambulatory Visit: Payer: Self-pay

## 2019-11-20 DIAGNOSIS — J449 Chronic obstructive pulmonary disease, unspecified: Secondary | ICD-10-CM

## 2019-11-20 NOTE — Progress Notes (Signed)
Daily Session Note  Patient Details  Name: Todd ZUCKERMAN Sr. MRN: [id number] Date of Birth: [birth year] Referring Provider:     Pulmonary Rehab from 10/09/2019 in [facility] Cardiac and Pulmonary Rehab  Referring Provider  Humphrey Rolls      Encounter Date: 11/20/2019  Check In: Session Check In - 11/20/19 1022      Check-In   Supervising physician immediately available to respond to emergencies  See telemetry face sheet for immediately available ER MD    Location  [facility]-Cardiac & Pulmonary Rehab    Staff Present  Renita Papa, RN BSN;Joseph [address] [place], [place], ACSM CEP, Exercise Physiologist    Virtual Visit  No    Medication changes reported      No    Fall or balance concerns reported     No    Warm-up and Cool-down  Performed on first and last piece of equipment    Resistance Training Performed  Yes    VAD Patient?  No    PAD/SET Patient?  No      Pain Assessment   Currently in Pain?  No/denies          Social History   Tobacco Use  Smoking Status Former Smoker  . Packs/day: 1.00  . Years: 64.00  . Pack years: 64.00  . Types: Cigarettes  . Quit date: 08/14/2019  . Years since quitting: 0.2  Smokeless Tobacco Never Used  Tobacco Comment   patient states that he is done smoking    Goals Met:  Independence with exercise equipment Exercise tolerated well No report of cardiac concerns or symptoms Strength training completed today  Goals Unmet:  Not Applicable  Comments: Pt able to follow exercise prescription today without complaint.  Will continue to monitor for progression.    Dr. Emily Filbert is Medical Director for [place] and [facility].

## 2019-11-22 ENCOUNTER — Encounter: Payer: Self-pay | Admitting: *Deleted

## 2019-11-22 ENCOUNTER — Other Ambulatory Visit: Payer: Self-pay

## 2019-11-22 DIAGNOSIS — J449 Chronic obstructive pulmonary disease, unspecified: Secondary | ICD-10-CM

## 2019-11-22 NOTE — Progress Notes (Signed)
Pulmonary Individual Treatment Plan  Patient Details  Name: Todd WAIN Sr. MRN: [id number] Date of Birth: May 03, 1941 Referring Provider:     Pulmonary Rehab from 10/09/2019 in [facility] Cardiac and Pulmonary Rehab  Referring Provider  Humphrey Rolls      Initial Encounter Date:    Pulmonary Rehab from 10/09/2019 in [facility] Cardiac and Pulmonary Rehab  Date  10/09/19      Visit Diagnosis: Chronic obstructive pulmonary disease, unspecified COPD type ([place])  Patient's Home Medications on Admission:  Current Outpatient Medications:  .  acetaminophen (TYLENOL) 325 MG tablet, Take 2 tablets (650 mg total) by mouth every 4 (four) hours as needed for headache or mild pain., Disp:  , Rfl:  .  Budesonide 90 MCG/ACT inhaler, Inhale 1 puff into the lungs 2 (two) times daily., Disp: 1 each, Rfl: 0 .  Ipratropium-Albuterol (COMBIVENT) 20-100 MCG/ACT AERS respimat, Inhale 1 puff into the lungs every 6 (six) hours., Disp: 4 g, Rfl: 0 .  latanoprost (XALATAN) 0.005 % ophthalmic solution, Place 1 drop into the left eye at bedtime., Disp: , Rfl:  .  Multiple Vitamins tablet, Take 1 tablet by mouth daily at 12 noon. , Disp: , Rfl:  .  SIMBRINZA 1-0.2 % SUSP, Place 1 drop into both eyes 2 (two) times daily., Disp: , Rfl:  .  vitamin B-12 (CYANOCOBALAMIN) 1000 MCG tablet, Take 1,000 mcg by mouth daily at 12 noon. , Disp: , Rfl:   Past Medical History: Past Medical History:  Diagnosis Date  . COPD (chronic obstructive pulmonary disease) (HCC)     Tobacco Use: Social History   Tobacco Use  Smoking Status Former Smoker  . Packs/day: 1.00  . Years: 64.00  . Pack years: 64.00  . Types: Cigarettes  . Quit date: 08/14/2019  . Years since quitting: 0.2  Smokeless Tobacco Never Used  Tobacco Comment   patient states that he is done smoking    Labs: Recent Review Flowsheet Data    There is no flowsheet data to display.       Pulmonary Assessment Scores: Pulmonary Assessment Scores    Row Name 10/09/19 1428          ADL UCSD   SOB Score total  37     Rest  0     Walk  1     Stairs  3     Bath  2     Dress  2     Shop  3       CAT Score   CAT Score  19       mMRC Score   mMRC Score  3        UCSD: Self-administered rating of dyspnea associated with activities of daily living (ADLs) 6-point scale (0 = "not at all" to 5 = "maximal or unable to do because of breathlessness")  Scoring Scores range from 0 to 120.  Minimally important difference is 5 units  CAT: CAT can identify the health impairment of COPD patients and is better correlated with disease progression.  CAT has a scoring range of zero to 40. The CAT score is classified into four groups of low (less than 10), medium (10 - 20), high (21-30) and very high (31-40) based on the impact level of disease on health status. A CAT score over 10 suggests significant symptoms.  A worsening CAT score could be explained by an exacerbation, poor medication adherence, poor inhaler technique, or progression of COPD or comorbid conditions.  CAT  MCID is 2 points  mMRC: mMRC (Modified Medical Research Council) Dyspnea Scale is used to assess the degree of baseline functional disability in patients of respiratory disease due to dyspnea. No minimal important difference is established. A decrease in score of 1 point or greater is considered a positive change.   Pulmonary Function Assessment: Pulmonary Function Assessment - 10/05/19 1400      Breath   Shortness of Breath  Yes;Limiting activity       Exercise Target Goals: Exercise Program Goal: Individual exercise prescription set using results from initial 6 min walk test and THRR while considering  patient's activity barriers and safety.   Exercise Prescription Goal: Initial exercise prescription builds to 30-45 minutes a day of aerobic activity, 2-3 days per week.  Home exercise guidelines will be given to patient during program as part of exercise prescription that the participant will  acknowledge.  Education: Aerobic Exercise & Resistance Training: - Gives group verbal and written instruction on the various components of exercise. Focuses on aerobic and resistive training programs and the benefits of this training and how to safely progress through these programs..   Education: Exercise & Equipment Safety: - Individual verbal instruction and demonstration of equipment use and safety with use of the equipment.   Pulmonary Rehab from 10/09/2019 in [facility] Cardiac and Pulmonary Rehab  Date  10/09/19  Educator  AS  Instruction Review Code  1- Verbalizes Understanding      Education: Exercise Physiology & General Exercise Guidelines: - Group verbal and written instruction with models to review the exercise physiology of the cardiovascular system and associated critical values. Provides general exercise guidelines with specific guidelines to those with heart or lung disease.    Education: Flexibility, Balance, Mind/Body Relaxation: Provides group verbal/written instruction on the benefits of flexibility and balance training, including mind/body exercise modes such as yoga, pilates and tai chi.  Demonstration and skill practice provided.   Activity Barriers & Risk Stratification:   6 Minute Walk: 6 Minute Walk    Row Name 10/09/19 1407         6 Minute Walk   Phase  Initial     Distance  740 feet     Walk Time  5 minutes     # of Rest Breaks  1     MPH  1.68     METS  2.35     RPE  13     Perceived Dyspnea   3     VO2 Peak  8.2     Symptoms  Yes (comment)     Comments  shortness of breath     Resting HR  83 bpm     Resting BP  140/60     Resting Oxygen Saturation   96 %     Exercise Oxygen Saturation  during 6 min walk  91 %     Max Ex. HR  91 bpm     Max Ex. BP  158/50     2 Minute Post BP  132/64       Interval HR   1 Minute HR  87     2 Minute HR  89     3 Minute HR  85     4 Minute HR  71     5 Minute HR  74     6 Minute HR  91     2 Minute Post  HR  99     Interval Heart Rate?  Yes       Interval Oxygen   Interval Oxygen?  Yes     Baseline Oxygen Saturation %  96 %     1 Minute Oxygen Saturation %  91 %     1 Minute Liters of Oxygen  0 L     2 Minute Oxygen Saturation %  94 %     2 Minute Liters of Oxygen  0 L     3 Minute Oxygen Saturation %  95 %     3 Minute Liters of Oxygen  0 L     4 Minute Oxygen Saturation %  97 %     4 Minute Liters of Oxygen  0 L     5 Minute Oxygen Saturation %  96 %     5 Minute Liters of Oxygen  0 L     6 Minute Oxygen Saturation %  95 %     6 Minute Liters of Oxygen  0 L     2 Minute Post Oxygen Saturation %  98 %     2 Minute Post Liters of Oxygen  0 L       Oxygen Initial Assessment: Oxygen Initial Assessment - 10/05/19 1400      Home Oxygen   Home Oxygen Device  None    Sleep Oxygen Prescription  None    Home Exercise Oxygen Prescription  None    Home at Rest Exercise Oxygen Prescription  None      Initial 6 min Walk   Oxygen Used  None      Program Oxygen Prescription   Program Oxygen Prescription  None      Intervention   Short Term Goals  To learn and understand importance of monitoring SPO2 with pulse oximeter and demonstrate accurate use of the pulse oximeter.;To learn and understand importance of maintaining oxygen saturations>88%;To learn and demonstrate proper pursed lip breathing techniques or other breathing techniques.;To learn and demonstrate proper use of respiratory medications    Long  Term Goals  Verbalizes importance of monitoring SPO2 with pulse oximeter and return demonstration;Maintenance of O2 saturations>88%;Exhibits proper breathing techniques, such as pursed lip breathing or other method taught during program session;Compliance with respiratory medication;Demonstrates proper use of MDI's       Oxygen Re-Evaluation: Oxygen Re-Evaluation    Row Name 10/16/19 1029 11/15/19 1017           Program Oxygen Prescription   Program Oxygen Prescription  None   None        Home Oxygen   Home Oxygen Device  None  None      Sleep Oxygen Prescription  None  None      Home Exercise Oxygen Prescription  None  None      Home at Rest Exercise Oxygen Prescription  None  None        Goals/Expected Outcomes   Short Term Goals  To learn and understand importance of monitoring SPO2 with pulse oximeter and demonstrate accurate use of the pulse oximeter.;To learn and understand importance of maintaining oxygen saturations>88%;To learn and demonstrate proper pursed lip breathing techniques or other breathing techniques.;To learn and demonstrate proper use of respiratory medications  To learn and understand importance of monitoring SPO2 with pulse oximeter and demonstrate accurate use of the pulse oximeter.;To learn and understand importance of maintaining oxygen saturations>88%;To learn and demonstrate proper pursed lip breathing techniques or other breathing techniques.;To learn and demonstrate proper use of respiratory medications  Long  Term Goals  Verbalizes importance of monitoring SPO2 with pulse oximeter and return demonstration;Maintenance of O2 saturations>88%;Exhibits proper breathing techniques, such as pursed lip breathing or other method taught during program session;Compliance with respiratory medication;Demonstrates proper use of MDI's  Verbalizes importance of monitoring SPO2 with pulse oximeter and return demonstration;Maintenance of O2 saturations>88%;Exhibits proper breathing techniques, such as pursed lip breathing or other method taught during program session;Compliance with respiratory medication;Demonstrates proper use of MDI's      Comments  Reviewed PLB technique with pt.  Talked about how it works and it's importance in maintaining their exercise saturations.  He does not have a pulse oximeter to check his oxygen saturation at home. Informed him where to get one and explained why it is important to have one. Reviewed that oxygen saturations  should be 88 percent and above. Patient has to obtain a pulse oximeter at home.      Goals/Expected Outcomes  Short: Become more profiecient at using PLB.   Long: Become independent at using PLB.  Short: Obtain a pulse oximeter, monitor oxygen at home with exertion. Long: maintain oxygen saturations above 88 percent independently.         Oxygen Discharge (Final Oxygen Re-Evaluation): Oxygen Re-Evaluation - 11/15/19 1017      Program Oxygen Prescription   Program Oxygen Prescription  None      Home Oxygen   Home Oxygen Device  None    Sleep Oxygen Prescription  None    Home Exercise Oxygen Prescription  None    Home at Rest Exercise Oxygen Prescription  None      Goals/Expected Outcomes   Short Term Goals  To learn and understand importance of monitoring SPO2 with pulse oximeter and demonstrate accurate use of the pulse oximeter.;To learn and understand importance of maintaining oxygen saturations>88%;To learn and demonstrate proper pursed lip breathing techniques or other breathing techniques.;To learn and demonstrate proper use of respiratory medications    Long  Term Goals  Verbalizes importance of monitoring SPO2 with pulse oximeter and return demonstration;Maintenance of O2 saturations>88%;Exhibits proper breathing techniques, such as pursed lip breathing or other method taught during program session;Compliance with respiratory medication;Demonstrates proper use of MDI's    Comments  He does not have a pulse oximeter to check his oxygen saturation at home. Informed him where to get one and explained why it is important to have one. Reviewed that oxygen saturations should be 88 percent and above. Patient has to obtain a pulse oximeter at home.    Goals/Expected Outcomes  Short: Obtain a pulse oximeter, monitor oxygen at home with exertion. Long: maintain oxygen saturations above 88 percent independently.       Initial Exercise Prescription: Initial Exercise Prescription - 10/09/19 1400       Date of Initial Exercise RX and Referring Provider   Date  10/09/19    Referring Provider  Humphrey Rolls      Treadmill   MPH  1.6    Grade  0.5    Minutes  15    METs  2.3      NuStep   Level  2    SPM  80    Minutes  15    METs  2.3      Biostep-RELP   Level  2    SPM  50    Minutes  15    METs  2      Prescription Details   Frequency (times per week)  3  Duration  Progress to 30 minutes of continuous aerobic without signs/symptoms of physical distress      Intensity   THRR 40-80% of Max Heartrate  107-131    Ratings of Perceived Exertion  11-13    Perceived Dyspnea  0-4      Resistance Training   Training Prescription  Yes    Weight  3 lb    Reps  10-15       Perform Capillary Blood Glucose checks as needed.  Exercise Prescription Changes: Exercise Prescription Changes    Row Name 10/18/19 1300 11/01/19 1500 11/16/19 1200         Response to Exercise   Blood Pressure (Admit)  104/60  122/70  118/58     Blood Pressure (Exercise)  138/58  150/52  136/62     Blood Pressure (Exit)  110/60  122/52  120/60     Heart Rate (Admit)  71 bpm  70 bpm  71 bpm     Heart Rate (Exercise)  74 bpm  71 bpm  73 bpm     Heart Rate (Exit)  74 bpm  71 bpm  70 bpm     Oxygen Saturation (Admit)  98 %  97 %  97 %     Oxygen Saturation (Exercise)  99 %  98 %  96 %     Oxygen Saturation (Exit)  97 %  98 %  97 %     Rating of Perceived Exertion (Exercise)  _0 Perceived Dyspnea (Exercise)  0  1  0     Symptoms  none  none  none     Comments  first full day of exercise  --  --     Duration  Progress to 30 minutes of  aerobic without signs/symptoms of physical distress  Continue with 30 min of aerobic exercise without signs/symptoms of physical distress.  Continue with 30 min of aerobic exercise without signs/symptoms of physical distress.     Intensity  THRR unchanged  THRR unchanged  THRR unchanged       Progression   Progression  Continue to progress workloads to  maintain intensity without signs/symptoms of physical distress.  Continue to progress workloads to maintain intensity without signs/symptoms of physical distress.  Continue to progress workloads to maintain intensity without signs/symptoms of physical distress.     Average METs  2.15  2.3  2.5       Resistance Training   Training Prescription  Yes  Yes  Yes     Weight  3 lb  3 lb  3 lb     Reps  10-15  10-15  10-15       Interval Training   Interval Training  No  No  No       Treadmill   MPH  0.7  0.8  --     Grade  0  0  --     Minutes  15  15  --     METs  1.5  1.6  --       NuStep   Level  _1 Minutes  _2 METs  2._3 Biostep-RELP   Level  --  --  3     Minutes  --  --  15     METs  --  --  2.5        Exercise Comments:   Exercise Goals and Review: Exercise Goals    Row Name 10/09/19 1423             Exercise Goals   Increase Physical Activity  Yes       Intervention  Provide advice, education, support and counseling about physical activity/exercise needs.;Develop an individualized exercise prescription for aerobic and resistive training based on initial evaluation findings, risk stratification, comorbidities and participant's personal goals.       Expected Outcomes  Short Term: Attend rehab on a regular basis to increase amount of physical activity.;Long Term: Add in home exercise to make exercise part of routine and to increase amount of physical activity.;Long Term: Exercising regularly at least 3-5 days a week.       Increase Strength and Stamina  Yes       Intervention  Provide advice, education, support and counseling about physical activity/exercise needs.;Develop an individualized exercise prescription for aerobic and resistive training based on initial evaluation findings, risk stratification, comorbidities and participant's personal goals.       Expected Outcomes  Short Term: Increase workloads from initial exercise prescription  for resistance, speed, and METs.;Short Term: Perform resistance training exercises routinely during rehab and add in resistance training at home;Long Term: Improve cardiorespiratory fitness, muscular endurance and strength as measured by increased METs and functional capacity (6MWT)       Able to understand and use rate of perceived exertion (RPE) scale  Yes       Intervention  Provide education and explanation on how to use RPE scale       Expected Outcomes  Short Term: Able to use RPE daily in rehab to express subjective intensity level;Long Term:  Able to use RPE to guide intensity level when exercising independently       Able to understand and use Dyspnea scale  Yes       Intervention  Provide education and explanation on how to use Dyspnea scale       Expected Outcomes  Short Term: Able to use Dyspnea scale daily in rehab to express subjective sense of shortness of breath during exertion;Long Term: Able to use Dyspnea scale to guide intensity level when exercising independently       Knowledge and understanding of Target Heart Rate Range (THRR)  Yes       Intervention  Provide education and explanation of THRR including how the numbers were predicted and where they are located for reference       Expected Outcomes  Short Term: Able to state/look up THRR;Short Term: Able to use daily as guideline for intensity in rehab;Long Term: Able to use THRR to govern intensity when exercising independently       Able to check pulse independently  Yes       Intervention  Provide education and demonstration on how to check pulse in carotid and radial arteries.;Review the importance of being able to check your own pulse for safety during independent exercise       Expected Outcomes  Short Term: Able to explain why pulse checking is important during independent exercise;Long Term: Able to check pulse independently and accurately       Understanding of Exercise Prescription  Yes       Intervention  Provide  education, explanation, and written materials on patient's individual exercise prescription       Expected Outcomes  Short Term: Able to explain program exercise prescription;Long  Term: Able to explain home exercise prescription to exercise independently          Exercise Goals Re-Evaluation : Exercise Goals Re-Evaluation    Row Name 10/16/19 1026 11/01/19 1501 11/15/19 1021         Exercise Goal Re-Evaluation   Exercise Goals Review  Increase Physical Activity;Able to understand and use rate of perceived exertion (RPE) scale;Knowledge and understanding of Target Heart Rate Range (THRR);Understanding of Exercise Prescription;Able to understand and use Dyspnea scale;Increase Strength and Stamina;Able to check pulse independently  Increase Physical Activity;Increase Strength and Stamina;Understanding of Exercise Prescription  Increase Physical Activity;Increase Strength and Stamina;Understanding of Exercise Prescription     Comments  Reviewed RPE scale, THR and program prescription with pt today.  Pt voiced understanding and was given a copy of goals to take home.  Todd Webster is off to a good start in rehab.  He is already up to level 4 on the NuStep.  We will continue to monitor his progress.  Patient states that he works still and is active enough where he feels like he doesnt need to exercise. He wants to be gain strength and stamina so he can do his work. Informed him that it would be good to exercsie after the program is done.     Expected Outcomes  Short: Use RPE daily to regulate intensity. Long: Follow program prescription in THR.  Short: Continue to attend regularly Long: Continue to follow program prescription  Short: attend [facility] to increase strength. Long: maintain exercise at home independently        Discharge Exercise Prescription (Final Exercise Prescription Changes): Exercise Prescription Changes - 11/16/19 1200      Response to Exercise   Blood Pressure (Admit)  118/58    Blood  Pressure (Exercise)  136/62    Blood Pressure (Exit)  120/60    Heart Rate (Admit)  71 bpm    Heart Rate (Exercise)  73 bpm    Heart Rate (Exit)  70 bpm    Oxygen Saturation (Admit)  97 %    Oxygen Saturation (Exercise)  96 %    Oxygen Saturation (Exit)  97 %    Rating of Perceived Exertion (Exercise)  17    Perceived Dyspnea (Exercise)  0    Symptoms  none    Duration  Continue with 30 min of aerobic exercise without signs/symptoms of physical distress.    Intensity  THRR unchanged      Progression   Progression  Continue to progress workloads to maintain intensity without signs/symptoms of physical distress.    Average METs  2.5      Resistance Training   Training Prescription  Yes    Weight  3 lb    Reps  10-15      Interval Training   Interval Training  No      NuStep   Level  3    Minutes  15    METs  3      Biostep-RELP   Level  3    Minutes  15    METs  2.5       Nutrition:  Target Goals: Understanding of nutrition guidelines, daily intake of sodium <1529m, cholesterol <2044m calories 30% from fat and 7% or less from saturated fats, daily to have 5 or more servings of fruits and vegetables.  Education: Controlling Sodium/Reading Food Labels -Group verbal and written material supporting the discussion of sodium use in heart healthy nutrition. Review and explanation with models,  verbal and written materials for utilization of the food label.   Education: General Nutrition Guidelines/Fats and Fiber: -Group instruction provided by verbal, written material, models and posters to present the general guidelines for heart healthy nutrition. Gives an explanation and review of dietary fats and fiber.   Biometrics:    Nutrition Therapy Plan and Nutrition Goals: Nutrition Therapy & Goals - 10/30/19 1014      Nutrition Therapy   Diet  high kcal, high pro    Protein (specify units)  90-105g    Fiber  30 grams    Whole Grain Foods  3 servings    Saturated Fats   12 max. grams    Fruits and Vegetables  5 servings/day    Sodium  1.5 grams      Personal Nutrition Goals   Nutrition Goal  LT: increase ADLs be able to go grocery shopping. would like to get back up tp 130-140lbs now 132.9lbs.    Comments  2 cups coffee non-dairy creamer, rice krispies and milk, a fruit cup. Baked potato and [place] sandwich. OatmealMarella Webster and love it. Usually two meals, hungry during the day, no SOB during or after eating. Pt reports not having fluid problems and not on water pill. Kcal:  1800-2100kcal/day Protein:  90-105g/day. 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 Pt not meeting needs emphasized importance of eating enough kcal and pro. Pulmonary education. Pt reports wife is restricting him. Will continue to f/u      Intervention Plan   Intervention  Prescribe, educate and counsel regarding individualized specific dietary modifications aiming towards targeted core components such as weight, hypertension, lipid management, diabetes, heart failure and other comorbidities.;Nutrition handout(s) given to patient.    Expected Outcomes  Short Term Goal: Understand basic principles of dietary content, such as calories, fat, sodium, cholesterol and nutrients.;Short Term Goal: A plan has been developed with personal nutrition goals set during dietitian appointment.;Long Term Goal: Adherence to prescribed nutrition plan.       Nutrition Assessments: Nutrition Assessments - 10/09/19 1429      MEDFICTS Scores   Pre Score  12       MEDIFICTS Score Key:          ?70 Need to make dietary changes          40-70 Heart Healthy Diet         ? 40 Therapeutic Level Cholesterol Diet  Nutrition Goals Re-Evaluation:   Nutrition Goals Discharge (Final Nutrition Goals Re-Evaluation):   Psychosocial: Target Goals: Acknowledge presence or absence of significant depression and/or stress, maximize coping skills, provide positive support system.  Participant is able to verbalize types and ability to use techniques and skills needed for reducing stress and depression.   Education: Depression - Provides group verbal and written instruction on the correlation between heart/lung disease and depressed mood, treatment options, and the stigmas associated with seeking treatment.   Education: Sleep Hygiene -Provides group verbal and written instruction about how sleep can affect your health.  Define sleep hygiene, discuss sleep cycles and impact of sleep habits. Review good sleep hygiene tips.    Education: Stress and Anxiety: - Provides group verbal and written instruction about the health risks of elevated stress and causes of high stress.  Discuss the correlation between heart/lung disease and anxiety and treatment options. Review healthy ways to manage with stress and anxiety.   Initial Review & Psychosocial Screening: Initial Psych Review & Screening - 10/05/19 1401  Initial Review   Current issues with  Current Stress Concerns    Source of Stress Concerns  Financial    Comments  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      Family Dynamics   Good Support System?  Yes    Comments  He can look to his wife, son and neighbor.      Barriers   Psychosocial barriers to participate in program  There are no identifiable barriers or psychosocial needs.;The patient should benefit from training in stress management and relaxation.      Screening Interventions   Interventions  Encouraged to exercise;To provide support and resources with identified psychosocial needs;Provide feedback about the scores to participant    Expected Outcomes  Short Term goal: Utilizing psychosocial counselor, staff and physician to assist with identification of specific Stressors or current issues interfering with healing process. Setting desired goal for each stressor or current issue identified.;Long Term Goal: Stressors or current  issues are controlled or eliminated.;Short Term goal: Identification and review with participant of any Quality of Life or Depression concerns found by scoring the questionnaire.;Long Term goal: The participant improves quality of Life and PHQ9 Scores as seen by post scores and/or verbalization of changes       Quality of Life Scores:  Scores of 19 and below usually indicate a poorer quality of life in these areas.  A difference of  2-3 points is a clinically meaningful difference.  A difference of 2-3 points in the total score of the Quality of Life Index has been associated with significant improvement in overall quality of life, self-image, physical symptoms, and general health in studies assessing change in quality of life.  PHQ-9: Recent Review Flowsheet Data    Depression screen [facility] 2/9 10/09/2019   Decreased Interest 0   Down, Depressed, Hopeless 0   PHQ - 2 Score 0   Altered sleeping 0   Tired, decreased energy 0   Change in appetite 0   Feeling bad or failure about yourself  0   Trouble concentrating 0   Moving slowly or fidgety/restless 0   Suicidal thoughts 0   PHQ-9 Score 0     Interpretation of Total Score  Total Score Depression Severity:  1-4 = Minimal depression, 5-9 = Mild depression, 10-14 = Moderate depression, 15-19 = Moderately severe depression, 20-27 = Severe depression   Psychosocial Evaluation and Intervention: Psychosocial Evaluation - 10/05/19 1408      Psychosocial Evaluation & Interventions   Interventions  Encouraged to exercise with the program and follow exercise prescription    Comments  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 He has a good family support system.    Expected Outcomes  Short: Attend [facility] stress management education to decrease stress. Long: Maintain exercise Post [facility] to keep stress at a minimum.    Continue Psychosocial Services   Follow up required by staff       Psychosocial  Re-Evaluation: Psychosocial Re-Evaluation    [place] Name 11/15/19 1024             Psychosocial Re-Evaluation   Current issues with  Current Stress Concerns       Comments  Patient feels physical stress but does not feel any emotional stress. 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       Expected Outcomes  Short: Exericse to keep  stress at a minimum. Long: maintain exercise to reduce stress on a daily basis independently.       Interventions  Encouraged to attend Pulmonary Rehabilitation for the exercise       Continue Psychosocial Services   Follow up required by staff          Psychosocial Discharge (Final Psychosocial Re-Evaluation): Psychosocial Re-Evaluation - 11/15/19 1024      Psychosocial Re-Evaluation   Current issues with  Current Stress Concerns    Comments  Patient feels physical stress but does not feel any emotional stress. 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    Expected Outcomes  Short: Exericse to keep stress at a minimum. Long: maintain exercise to reduce stress on a daily basis independently.    Interventions  Encouraged to attend Pulmonary Rehabilitation for the exercise    Continue Psychosocial Services   Follow up required by staff       Education: Education Goals: Education classes will be provided on a weekly basis, covering required topics. Participant will state understanding/return demonstration of topics presented.  Learning Barriers/Preferences: Learning Barriers/Preferences - 10/05/19 1402      Learning Barriers/Preferences   Learning Barriers  Sight    Learning Preferences  None       General Pulmonary Education Topics:  Infection Prevention: - Provides verbal and written material to individual with discussion of infection control including proper hand washing and proper equipment cleaning during exercise session.   Pulmonary Rehab  from 10/09/2019 in [facility]  Date  10/09/19  Educator  AS  Instruction Review Code  1- Verbalizes Understanding      Falls Prevention: - Provides verbal and written material to individual with discussion of falls prevention and safety.   Pulmonary Rehab from 10/09/2019 in [facility] Cardiac and Pulmonary Rehab  Date  10/09/19  Educator  AS  Instruction Review Code  1- Verbalizes Understanding      Chronic Lung Diseases: - Group verbal and written instruction to review updates, respiratory medications, advancements in procedures and treatments. Discuss use of supplemental oxygen including available portable oxygen systems, continuous and intermittent flow rates, concentrators, personal use and safety guidelines. Review proper use of inhaler and spacers. Provide informative websites for self-education.    Energy Conservation: - Provide group verbal and written instruction for methods to conserve energy, plan and organize activities. Instruct on pacing techniques, use of adaptive equipment and posture/positioning to relieve shortness of breath.   Triggers and Exacerbations: - Group verbal and written instruction to review types of environmental triggers and ways to prevent exacerbations. Discuss weather changes, air quality and the benefits of nasal washing. Review warning signs and symptoms to help prevent infections. Discuss techniques for effective airway clearance, coughing, and vibrations.   AED/CPR: - Group verbal and written instruction with the use of models to demonstrate the basic use of the AED with the basic ABC's of resuscitation.   Anatomy and Physiology of the Lungs: - Group verbal and written instruction with the use of models to provide basic lung anatomy and physiology related to function, structure and complications of lung disease.   Anatomy & Physiology of the Heart: - Group verbal and written instruction and models provide basic cardiac anatomy  and physiology, with the coronary electrical and arterial systems. Review of Valvular disease and Heart Failure   Cardiac Medications: - Group verbal and written instruction to review commonly prescribed medications  for heart disease. Reviews the medication, class of the drug, and side effects.   Other: -Provides group and verbal instruction on various topics (see comments)   Knowledge Questionnaire Score: Knowledge Questionnaire Score - 10/16/19 1104      Knowledge Questionnaire Score   Pre Score  went over correct answers        Core Components/Risk Factors/Patient Goals at Admission: Personal Goals and Risk Factors at Admission - 10/09/19 1421      Core Components/Risk Factors/Patient Goals on Admission    Weight Management  Yes;Weight Gain    Intervention  Weight Management: Develop a combined nutrition and exercise program designed to reach desired caloric intake, while maintaining appropriate intake of nutrient and fiber, sodium and fats, and appropriate energy expenditure required for the weight goal.;Weight Management: Provide education and appropriate resources to help participant work on and attain dietary goals.;Weight Management/Obesity: Establish reasonable short term and long term weight goals.    Admit Weight  133 lb (60.3 kg)    Goal Weight: Short Term  143 lb (64.9 kg)    Goal Weight: Long Term  150 lb (68 kg)    Expected Outcomes  Short Term: Continue to assess and modify interventions until short term weight is achieved;Long Term: Adherence to nutrition and physical activity/exercise program aimed toward attainment of established weight goal;Understanding recommendations for meals to include 15-35% energy as protein, 25-35% energy from fat, 35-60% energy from carbohydrates, less than 29m of dietary cholesterol, 20-35 gm of total fiber daily;Understanding of distribution of calorie intake throughout the day with the consumption of 4-5 meals/snacks    Number of packs  per day  Patient has quit in January. former 1 pack a day    Intervention  Assist the participant in steps to quit. Provide individualized education and counseling about committing to Tobacco Cessation, relapse prevention, and pharmacological support that can be provided by physician.;OAdvice worker assist with locating and accessing local/national Quit Smoking programs, and support quit date choice.    Expected Outcomes  Short Term: Will demonstrate readiness to quit, by selecting a quit date.;Long Term: Complete abstinence from all tobacco products for at least 12 months from quit date.;Short Term: Will quit all tobacco product use, adhering to prevention of relapse plan.    Intervention  Provide education, individualized exercise plan and daily activity instruction to help decrease symptoms of SOB with activities of daily living.    Expected Outcomes  Short Term: Improve cardiorespiratory fitness to achieve a reduction of symptoms when performing ADLs;Long Term: Be able to perform more ADLs without symptoms or delay the onset of symptoms    Heart Failure  Yes    Intervention  Provide a combined exercise and nutrition program that is supplemented with education, support and counseling about heart failure. Directed toward relieving symptoms such as shortness of breath, decreased exercise tolerance, and extremity edema.    Expected Outcomes  Improve functional capacity of life;Short term: Attendance in program 2-3 days a week with increased exercise capacity. Reported lower sodium intake. Reported increased fruit and vegetable intake. Reports medication compliance.;Short term: Daily weights obtained and reported for increase. Utilizing diuretic protocols set by physician.;Long term: Adoption of self-care skills and reduction of barriers for early signs and symptoms recognition and intervention leading to self-care maintenance.       Education:Diabetes - Individual verbal and written  instruction to review signs/symptoms of diabetes, desired ranges of glucose level fasting, after meals and with exercise. Acknowledge that pre and post exercise  glucose checks will be done for 3 sessions at entry of program.   Education: Know Your Numbers and Risk Factors: -Group verbal and written instruction about important numbers in your health.  Discussion of what are risk factors and how they play a role in the disease process.  Review of Cholesterol, Blood Pressure, Diabetes, and BMI and the role they play in your overall health.   Core Components/Risk Factors/Patient Goals Review:  Goals and Risk Factor Review    Row Name 11/15/19 1027             Core Components/Risk Factors/Patient Goals Review   Personal Goals Review  Weight Management/Obesity;Improve shortness of breath with ADL's       Review  Todd Webster does not get short of breath as much since he paces hinmself. He has been feeling some improvement since the start of the program. He has managed to gain 2 pounds since the start of the program. He wants to gain more stamina by the end of the program to help his shortness breath.       Expected Outcomes  Short: contiue to attend [facility] regularly. Long: maintain exercise to help with ADLs independently          Core Components/Risk Factors/Patient Goals at Discharge (Final Review):  Goals and Risk Factor Review - 11/15/19 1027      Core Components/Risk Factors/Patient Goals Review   Personal Goals Review  Weight Management/Obesity;Improve shortness of breath with ADL's    Review  Todd Webster does not get short of breath as much since he paces hinmself. He has been feeling some improvement since the start of the program. He has managed to gain 2 pounds since the start of the program. He wants to gain more stamina by the end of the program to help his shortness breath.    Expected Outcomes  Short: contiue to attend [facility] regularly. Long: maintain exercise to help with ADLs  independently       ITP Comments: ITP Comments    Row Name 10/16/19 1024 10/25/19 1029 11/22/19 0632       ITP Comments  First full day of exercise!  Patient was oriented to gym and equipment including functions, settings, policies, and procedures.  Patient's individual exercise prescription and treatment plan were reviewed.  All starting workloads were established based on the results of the 6 minute walk test done at initial orientation visit.  The plan for exercise progression was also introduced and progression will be customized based on patient's performance and goals.  30 day chart review completed. ITP sent to Dr Zachery Dakins Medical Director, for review,changes as needed and signature.  New to program  30 Day review completed. Medical Director review done, changes made as directed,and approval shown by signature of Market researcher.        Comments:

## 2019-11-27 ENCOUNTER — Encounter: Payer: Medicare Other | Admitting: *Deleted

## 2019-11-27 ENCOUNTER — Other Ambulatory Visit: Payer: Self-pay

## 2019-11-27 DIAGNOSIS — J449 Chronic obstructive pulmonary disease, unspecified: Secondary | ICD-10-CM

## 2019-11-27 NOTE — Progress Notes (Signed)
Cardiac Individual Treatment Plan  Patient Details  Name: YAACOV KOZIOL Sr. MRN: [id number] Date of Birth: 07/05/1941 Referring Provider:     Pulmonary Rehab from 10/09/2019 in [facility] Cardiac and Pulmonary Rehab  Referring Provider  Humphrey Rolls      Initial Encounter Date:    Pulmonary Rehab from 10/09/2019 in [facility] Cardiac and Pulmonary Rehab  Date  10/09/19      Visit Diagnosis: Chronic obstructive pulmonary disease, unspecified COPD type ([place])  Patient's Home Medications on Admission:  Current Outpatient Medications:  .  acetaminophen (TYLENOL) 325 MG tablet, Take 2 tablets (650 mg total) by mouth every 4 (four) hours as needed for headache or mild pain., Disp:  , Rfl:  .  Budesonide 90 MCG/ACT inhaler, Inhale 1 puff into the lungs 2 (two) times daily., Disp: 1 each, Rfl: 0 .  Ipratropium-Albuterol (COMBIVENT) 20-100 MCG/ACT AERS respimat, Inhale 1 puff into the lungs every 6 (six) hours., Disp: 4 g, Rfl: 0 .  latanoprost (XALATAN) 0.005 % ophthalmic solution, Place 1 drop into the left eye at bedtime., Disp: , Rfl:  .  Multiple Vitamins tablet, Take 1 tablet by mouth daily at 12 noon. , Disp: , Rfl:  .  SIMBRINZA 1-0.2 % SUSP, Place 1 drop into both eyes 2 (two) times daily., Disp: , Rfl:  .  vitamin B-12 (CYANOCOBALAMIN) 1000 MCG tablet, Take 1,000 mcg by mouth daily at 12 noon. , Disp: , Rfl:   Past Medical History: Past Medical History:  Diagnosis Date  . COPD (chronic obstructive pulmonary disease) (HCC)     Tobacco Use: Social History   Tobacco Use  Smoking Status Former Smoker  . Packs/day: 1.00  . Years: 64.00  . Pack years: 64.00  . Types: Cigarettes  . Quit date: 08/14/2019  . Years since quitting: 0.2  Smokeless Tobacco Never Used  Tobacco Comment   patient states that he is done smoking    Labs: Recent Review Flowsheet Data    There is no flowsheet data to display.       Exercise Target Goals: Exercise Program Goal: Individual exercise prescription set  using results from initial 6 min walk test and THRR while considering  patient's activity barriers and safety.   Exercise Prescription Goal: Initial exercise prescription builds to 30-45 minutes a day of aerobic activity, 2-3 days per week.  Home exercise guidelines will be given to patient during program as part of exercise prescription that the participant will acknowledge.   Education: Aerobic Exercise & Resistance Training: - Gives group verbal and written instruction on the various components of exercise. Focuses on aerobic and resistive training programs and the benefits of this training and how to safely progress through these programs..   Education: Exercise & Equipment Safety: - Individual verbal instruction and demonstration of equipment use and safety with use of the equipment.   Pulmonary Rehab from 10/09/2019 in [facility] Cardiac and Pulmonary Rehab  Date  10/09/19  Educator  AS  Instruction Review Code  1- Verbalizes Understanding      Education: Exercise Physiology & General Exercise Guidelines: - Group verbal and written instruction with models to review the exercise physiology of the cardiovascular system and associated critical values. Provides general exercise guidelines with specific guidelines to those with heart or lung disease.    Education: Flexibility, Balance, Mind/Body Relaxation: Provides group verbal/written instruction on the benefits of flexibility and balance training, including mind/body exercise modes such as yoga, pilates and tai chi.  Demonstration and skill practice provided.  Activity Barriers & Risk Stratification:   6 Minute Walk: 6 Minute Walk    Row Name 10/09/19 1407         6 Minute Walk   Phase  Initial     Distance  740 feet     Walk Time  5 minutes     # of Rest Breaks  1     MPH  1.68     METS  2.35     RPE  13     Perceived Dyspnea   3     VO2 Peak  8.2     Symptoms  Yes (comment)     Comments  shortness of breath     Resting  HR  83 bpm     Resting BP  140/60     Resting Oxygen Saturation   96 %     Exercise Oxygen Saturation  during 6 min walk  91 %     Max Ex. HR  91 bpm     Max Ex. BP  158/50     2 Minute Post BP  132/64       Interval HR   1 Minute HR  87     2 Minute HR  89     3 Minute HR  85     4 Minute HR  71     5 Minute HR  74     6 Minute HR  91     2 Minute Post HR  99     Interval Heart Rate?  Yes       Interval Oxygen   Interval Oxygen?  Yes     Baseline Oxygen Saturation %  96 %     1 Minute Oxygen Saturation %  91 %     1 Minute Liters of Oxygen  0 L     2 Minute Oxygen Saturation %  94 %     2 Minute Liters of Oxygen  0 L     3 Minute Oxygen Saturation %  95 %     3 Minute Liters of Oxygen  0 L     4 Minute Oxygen Saturation %  97 %     4 Minute Liters of Oxygen  0 L     5 Minute Oxygen Saturation %  96 %     5 Minute Liters of Oxygen  0 L     6 Minute Oxygen Saturation %  95 %     6 Minute Liters of Oxygen  0 L     2 Minute Post Oxygen Saturation %  98 %     2 Minute Post Liters of Oxygen  0 L        Oxygen Initial Assessment: Oxygen Initial Assessment - 10/05/19 1400      Home Oxygen   Home Oxygen Device  None    Sleep Oxygen Prescription  None    Home Exercise Oxygen Prescription  None    Home at Rest Exercise Oxygen Prescription  None      Initial 6 min Walk   Oxygen Used  None      Program Oxygen Prescription   Program Oxygen Prescription  None      Intervention   Short Term Goals  To learn and understand importance of monitoring SPO2 with pulse oximeter and demonstrate accurate use of the pulse oximeter.;To learn and understand importance of maintaining oxygen saturations>88%;To learn and demonstrate proper pursed lip breathing techniques  or other breathing techniques.;To learn and demonstrate proper use of respiratory medications    Long  Term Goals  Verbalizes importance of monitoring SPO2 with pulse oximeter and return demonstration;Maintenance of O2  saturations>88%;Exhibits proper breathing techniques, such as pursed lip breathing or other method taught during program session;Compliance with respiratory medication;Demonstrates proper use of MDI's       Oxygen Re-Evaluation: Oxygen Re-Evaluation    Row Name 10/16/19 1029 11/15/19 1017           Program Oxygen Prescription   Program Oxygen Prescription  None  None        Home Oxygen   Home Oxygen Device  None  None      Sleep Oxygen Prescription  None  None      Home Exercise Oxygen Prescription  None  None      Home at Rest Exercise Oxygen Prescription  None  None        Goals/Expected Outcomes   Short Term Goals  To learn and understand importance of monitoring SPO2 with pulse oximeter and demonstrate accurate use of the pulse oximeter.;To learn and understand importance of maintaining oxygen saturations>88%;To learn and demonstrate proper pursed lip breathing techniques or other breathing techniques.;To learn and demonstrate proper use of respiratory medications  To learn and understand importance of monitoring SPO2 with pulse oximeter and demonstrate accurate use of the pulse oximeter.;To learn and understand importance of maintaining oxygen saturations>88%;To learn and demonstrate proper pursed lip breathing techniques or other breathing techniques.;To learn and demonstrate proper use of respiratory medications      Long  Term Goals  Verbalizes importance of monitoring SPO2 with pulse oximeter and return demonstration;Maintenance of O2 saturations>88%;Exhibits proper breathing techniques, such as pursed lip breathing or other method taught during program session;Compliance with respiratory medication;Demonstrates proper use of MDI's  Verbalizes importance of monitoring SPO2 with pulse oximeter and return demonstration;Maintenance of O2 saturations>88%;Exhibits proper breathing techniques, such as pursed lip breathing or other method taught during program session;Compliance with  respiratory medication;Demonstrates proper use of MDI's      Comments  Reviewed PLB technique with pt.  Talked about how it works and it's importance in maintaining their exercise saturations.  He does not have a pulse oximeter to check his oxygen saturation at home. Informed him where to get one and explained why it is important to have one. Reviewed that oxygen saturations should be 88 percent and above. Patient has to obtain a pulse oximeter at home.      Goals/Expected Outcomes  Short: Become more profiecient at using PLB.   Long: Become independent at using PLB.  Short: Obtain a pulse oximeter, monitor oxygen at home with exertion. Long: maintain oxygen saturations above 88 percent independently.         Oxygen Discharge (Final Oxygen Re-Evaluation): Oxygen Re-Evaluation - 11/15/19 1017      Program Oxygen Prescription   Program Oxygen Prescription  None      Home Oxygen   Home Oxygen Device  None    Sleep Oxygen Prescription  None    Home Exercise Oxygen Prescription  None    Home at Rest Exercise Oxygen Prescription  None      Goals/Expected Outcomes   Short Term Goals  To learn and understand importance of monitoring SPO2 with pulse oximeter and demonstrate accurate use of the pulse oximeter.;To learn and understand importance of maintaining oxygen saturations>88%;To learn and demonstrate proper pursed lip breathing techniques or other breathing techniques.;To learn and demonstrate proper use  of respiratory medications    Long  Term Goals  Verbalizes importance of monitoring SPO2 with pulse oximeter and return demonstration;Maintenance of O2 saturations>88%;Exhibits proper breathing techniques, such as pursed lip breathing or other method taught during program session;Compliance with respiratory medication;Demonstrates proper use of MDI's    Comments  He does not have a pulse oximeter to check his oxygen saturation at home. Informed him where to get one and explained why it is important  to have one. Reviewed that oxygen saturations should be 88 percent and above. Patient has to obtain a pulse oximeter at home.    Goals/Expected Outcomes  Short: Obtain a pulse oximeter, monitor oxygen at home with exertion. Long: maintain oxygen saturations above 88 percent independently.       Initial Exercise Prescription: Initial Exercise Prescription - 10/09/19 1400      Date of Initial Exercise RX and Referring Provider   Date  10/09/19    Referring Provider  Humphrey Rolls      Treadmill   MPH  1.6    Grade  0.5    Minutes  15    METs  2.3      NuStep   Level  2    SPM  80    Minutes  15    METs  2.3      Biostep-RELP   Level  2    SPM  50    Minutes  15    METs  2      Prescription Details   Frequency (times per week)  3    Duration  Progress to 30 minutes of continuous aerobic without signs/symptoms of physical distress      Intensity   THRR 40-80% of Max Heartrate  107-131    Ratings of Perceived Exertion  11-13    Perceived Dyspnea  0-4      Resistance Training   Training Prescription  Yes    Weight  3 lb    Reps  10-15       Perform Capillary Blood Glucose checks as needed.  Exercise Prescription Changes: Exercise Prescription Changes    Row Name 10/18/19 1300 11/01/19 1500 11/16/19 1200         Response to Exercise   Blood Pressure (Admit)  104/60  122/70  118/58     Blood Pressure (Exercise)  138/58  150/52  136/62     Blood Pressure (Exit)  110/60  122/52  120/60     Heart Rate (Admit)  71 bpm  70 bpm  71 bpm     Heart Rate (Exercise)  74 bpm  71 bpm  73 bpm     Heart Rate (Exit)  74 bpm  71 bpm  70 bpm     Oxygen Saturation (Admit)  98 %  97 %  97 %     Oxygen Saturation (Exercise)  99 %  98 %  96 %     Oxygen Saturation (Exit)  97 %  98 %  97 %     Rating of Perceived Exertion (Exercise)  '15  16  17     ' Perceived Dyspnea (Exercise)  0  1  0     Symptoms  none  none  none     Comments  first full day of exercise  --  --     Duration  Progress  to 30 minutes of  aerobic without signs/symptoms of physical distress  Continue with 30 min of aerobic exercise without signs/symptoms  of physical distress.  Continue with 30 min of aerobic exercise without signs/symptoms of physical distress.     Intensity  THRR unchanged  THRR unchanged  THRR unchanged       Progression   Progression  Continue to progress workloads to maintain intensity without signs/symptoms of physical distress.  Continue to progress workloads to maintain intensity without signs/symptoms of physical distress.  Continue to progress workloads to maintain intensity without signs/symptoms of physical distress.     Average METs  2.15  2.3  2.5       Resistance Training   Training Prescription  Yes  Yes  Yes     Weight  3 lb  3 lb  3 lb     Reps  10-15  10-15  10-15       Interval Training   Interval Training  No  No  No       Treadmill   MPH  0.7  0.8  --     Grade  0  0  --     Minutes  15  15  --     METs  1.5  1.6  --       NuStep   Level  '2  3  3     ' Minutes  '15  15  15     ' METs  2.'8  3  3       ' Biostep-RELP   Level  --  --  3     Minutes  --  --  15     METs  --  --  2.5        Exercise Comments:   Exercise Goals and Review: Exercise Goals    Row Name 10/09/19 1423             Exercise Goals   Increase Physical Activity  Yes       Intervention  Provide advice, education, support and counseling about physical activity/exercise needs.;Develop an individualized exercise prescription for aerobic and resistive training based on initial evaluation findings, risk stratification, comorbidities and participant's personal goals.       Expected Outcomes  Short Term: Attend rehab on a regular basis to increase amount of physical activity.;Long Term: Add in home exercise to make exercise part of routine and to increase amount of physical activity.;Long Term: Exercising regularly at least 3-5 days a week.       Increase Strength and Stamina  Yes        Intervention  Provide advice, education, support and counseling about physical activity/exercise needs.;Develop an individualized exercise prescription for aerobic and resistive training based on initial evaluation findings, risk stratification, comorbidities and participant's personal goals.       Expected Outcomes  Short Term: Increase workloads from initial exercise prescription for resistance, speed, and METs.;Short Term: Perform resistance training exercises routinely during rehab and add in resistance training at home;Long Term: Improve cardiorespiratory fitness, muscular endurance and strength as measured by increased METs and functional capacity (6MWT)       Able to understand and use rate of perceived exertion (RPE) scale  Yes       Intervention  Provide education and explanation on how to use RPE scale       Expected Outcomes  Short Term: Able to use RPE daily in rehab to express subjective intensity level;Long Term:  Able to use RPE to guide intensity level when exercising independently       Able to understand and  use Dyspnea scale  Yes       Intervention  Provide education and explanation on how to use Dyspnea scale       Expected Outcomes  Short Term: Able to use Dyspnea scale daily in rehab to express subjective sense of shortness of breath during exertion;Long Term: Able to use Dyspnea scale to guide intensity level when exercising independently       Knowledge and understanding of Target Heart Rate Range (THRR)  Yes       Intervention  Provide education and explanation of THRR including how the numbers were predicted and where they are located for reference       Expected Outcomes  Short Term: Able to state/look up THRR;Short Term: Able to use daily as guideline for intensity in rehab;Long Term: Able to use THRR to govern intensity when exercising independently       Able to check pulse independently  Yes       Intervention  Provide education and demonstration on how to check pulse in  carotid and radial arteries.;Review the importance of being able to check your own pulse for safety during independent exercise       Expected Outcomes  Short Term: Able to explain why pulse checking is important during independent exercise;Long Term: Able to check pulse independently and accurately       Understanding of Exercise Prescription  Yes       Intervention  Provide education, explanation, and written materials on patient's individual exercise prescription       Expected Outcomes  Short Term: Able to explain program exercise prescription;Long Term: Able to explain home exercise prescription to exercise independently          Exercise Goals Re-Evaluation : Exercise Goals Re-Evaluation    Row Name 10/16/19 1026 11/01/19 1501 11/15/19 1021         Exercise Goal Re-Evaluation   Exercise Goals Review  Increase Physical Activity;Able to understand and use rate of perceived exertion (RPE) scale;Knowledge and understanding of Target Heart Rate Range (THRR);Understanding of Exercise Prescription;Able to understand and use Dyspnea scale;Increase Strength and Stamina;Able to check pulse independently  Increase Physical Activity;Increase Strength and Stamina;Understanding of Exercise Prescription  Increase Physical Activity;Increase Strength and Stamina;Understanding of Exercise Prescription     Comments  Reviewed RPE scale, THR and program prescription with pt today.  Pt voiced understanding and was given a copy of goals to take home.  Miki is off to a good start in rehab.  He is already up to level 4 on the NuStep.  We will continue to monitor his progress.  Patient states that he works still and is active enough where he feels like he doesnt need to exercise. He wants to be gain strength and stamina so he can do his work. Informed him that it would be good to exercsie after the program is done.     Expected Outcomes  Short: Use RPE daily to regulate intensity. Long: Follow program prescription in  THR.  Short: Continue to attend regularly Long: Continue to follow program prescription  Short: attend [facility] to increase strength. Long: maintain exercise at home independently        Discharge Exercise Prescription (Final Exercise Prescription Changes): Exercise Prescription Changes - 11/16/19 1200      Response to Exercise   Blood Pressure (Admit)  118/58    Blood Pressure (Exercise)  136/62    Blood Pressure (Exit)  120/60    Heart Rate (Admit)  71 bpm  Heart Rate (Exercise)  73 bpm    Heart Rate (Exit)  70 bpm    Oxygen Saturation (Admit)  97 %    Oxygen Saturation (Exercise)  96 %    Oxygen Saturation (Exit)  97 %    Rating of Perceived Exertion (Exercise)  17    Perceived Dyspnea (Exercise)  0    Symptoms  none    Duration  Continue with 30 min of aerobic exercise without signs/symptoms of physical distress.    Intensity  THRR unchanged      Progression   Progression  Continue to progress workloads to maintain intensity without signs/symptoms of physical distress.    Average METs  2.5      Resistance Training   Training Prescription  Yes    Weight  3 lb    Reps  10-15      Interval Training   Interval Training  No      NuStep   Level  3    Minutes  15    METs  3      Biostep-RELP   Level  3    Minutes  15    METs  2.5       Nutrition:  Target Goals: Understanding of nutrition guidelines, daily intake of sodium <1524m, cholesterol <2057m calories 30% from fat and 7% or less from saturated fats, daily to have 5 or more servings of fruits and vegetables.  Education: Controlling Sodium/Reading Food Labels -Group verbal and written material supporting the discussion of sodium use in heart healthy nutrition. Review and explanation with models, verbal and written materials for utilization of the food label.   Education: General Nutrition Guidelines/Fats and Fiber: -Group instruction provided by verbal, written material, models and posters to present the  general guidelines for heart healthy nutrition. Gives an explanation and review of dietary fats and fiber.   Biometrics:    Nutrition Therapy Plan and Nutrition Goals: Nutrition Therapy & Goals - 10/30/19 1014      Nutrition Therapy   Diet  high kcal, high pro    Protein (specify units)  90-105g    Fiber  30 grams    Whole Grain Foods  3 servings    Saturated Fats  12 max. grams    Fruits and Vegetables  5 servings/day    Sodium  1.5 grams      Personal Nutrition Goals   Nutrition Goal  LT: increase ADLs be able to go grocery shopping. would like to get back up tp 130-140lbs now 132.9lbs.    Comments  2 cups coffee non-dairy creamer, rice krispies and milk, a fruit cup. Baked potato and tu[place]wich. Oatmeal. Marella Bilend love it. Usually two meals, hungry during the day, no SOB during or after eating. Pt reports not having fluid problems and not on water pill. Kcal:  1800-2100kcal/day Protein:  90-105g/day. 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 Pt not meeting needs emphasized importance of eating enough kcal and pro. Pulmonary education. Pt reports wife is restricting him. Will continue to f/u      Intervention Plan   Intervention  Prescribe, educate and counsel regarding individualized specific dietary modifications aiming towards targeted core components such as weight, hypertension, lipid management, diabetes, heart failure and other comorbidities.;Nutrition handout(s) given to patient.    Expected Outcomes  Short Term Goal: Understand basic principles of dietary content, such as calories, fat, sodium, cholesterol and nutrients.;Short Term Goal: A plan has been developed with personal  nutrition goals set during dietitian appointment.;Long Term Goal: Adherence to prescribed nutrition plan.       Nutrition Assessments: Nutrition Assessments - 10/09/19 1429      MEDFICTS Scores   Pre Score  12       MEDIFICTS Score Key:          ?70  Need to make dietary changes          40-70 Heart Healthy Diet         ? 40 Therapeutic Level Cholesterol Diet  Nutrition Goals Re-Evaluation:   Nutrition Goals Discharge (Final Nutrition Goals Re-Evaluation):   Psychosocial: Target Goals: Acknowledge presence or absence of significant depression and/or stress, maximize coping skills, provide positive support system. Participant is able to verbalize types and ability to use techniques and skills needed for reducing stress and depression.   Education: Depression - Provides group verbal and written instruction on the correlation between heart/lung disease and depressed mood, treatment options, and the stigmas associated with seeking treatment.   Education: Sleep Hygiene -Provides group verbal and written instruction about how sleep can affect your health.  Define sleep hygiene, discuss sleep cycles and impact of sleep habits. Review good sleep hygiene tips.     Education: Stress and Anxiety: - Provides group verbal and written instruction about the health risks of elevated stress and causes of high stress.  Discuss the correlation between heart/lung disease and anxiety and treatment options. Review healthy ways to manage with stress and anxiety.    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      Family Dynamics   Good Support System?  Yes    Comments  He can look to his wife, son and neighbor.      Barriers   Psychosocial barriers to participate in program  There are no identifiable barriers or psychosocial needs.;The patient should benefit from training in stress management and relaxation.      Screening Interventions   Interventions  Encouraged to exercise;To provide support and resources with  identified psychosocial needs;Provide feedback about the scores to participant    Expected Outcomes  Short Term goal: Utilizing psychosocial counselor, staff and physician to assist with identification of specific Stressors or current issues interfering with healing process. Setting desired goal for each stressor or current issue identified.;Long Term Goal: Stressors or current issues are controlled or eliminated.;Short Term goal: Identification and review with participant of any Quality of Life or Depression concerns found by scoring the questionnaire.;Long Term goal: The participant improves quality of Life and PHQ9 Scores as seen by post scores and/or verbalization of changes       Quality of Life Scores:   Scores of 19 and below usually indicate a poorer quality of life in these areas.  A difference of  2-3 points is a clinically meaningful difference.  A difference of 2-3 points in the total score of the Quality of Life Index has been associated with significant improvement in overall quality of life, self-image, physical symptoms, and general health in studies assessing change in quality of life.  PHQ-9: Recent Review Flowsheet Data    Depression screen [facility] 2/9 10/09/2019   Decreased Interest 0   Down, Depressed, Hopeless 0  PHQ - 2 Score 0   Altered sleeping 0   Tired, decreased energy 0   Change in appetite 0   Feeling bad or failure about yourself  0   Trouble concentrating 0   Moving slowly or fidgety/restless 0   Suicidal thoughts 0   PHQ-9 Score 0     Interpretation of Total Score  Total Score Depression Severity:  1-4 = Minimal depression, 5-9 = Mild depression, 10-14 = Moderate depression, 15-19 = Moderately severe depression, 20-27 = Severe depression   Psychosocial Evaluation and Intervention: Psychosocial Evaluation - 10/05/19 1408      Psychosocial Evaluation & Interventions   Interventions  Encouraged to exercise with the program and follow exercise prescription     Comments  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 He has a good family support system.    Expected Outcomes  Short: Attend [facility] stress management education to decrease stress. Long: Maintain exercise [facility] to keep stress at a minimum.    Continue Psychosocial Services   Follow up required by staff       Psychosocial Re-Evaluation: Psychosocial Re-Evaluation    [place] Name 11/15/19 1024             Psychosocial Re-Evaluation   Current issues with  Current Stress Concerns       Comments  Patient feels physical stress but does not feel any emotional stress. 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       Expected Outcomes  Short: Exericse to keep stress at a minimum. Long: maintain exercise to reduce stress on a daily basis independently.       Interventions  Encouraged to attend Pulmonary Rehabilitation for the exercise       Continue Psychosocial Services   Follow up required by staff          Psychosocial Discharge (Final Psychosocial Re-Evaluation): Psychosocial Re-Evaluation - 11/15/19 1024      Psychosocial Re-Evaluation   Current issues with  Current Stress Concerns    Comments  Patient feels physical stress but does not feel any emotional stress. 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    Expected Outcomes  Short: Exericse to keep stress at a minimum. Long: maintain exercise to reduce stress on a daily basis independently.    Interventions  Encouraged to attend Pulmonary Rehabilitation for the exercise    Continue Psychosocial Services   Follow up required by staff       Vocational Rehabilitation: Provide vocational rehab assistance to qualifying candidates.   Vocational Rehab Evaluation & Intervention:   Education: Education Goals: Education classes will be provided on a variety of topics geared toward better understanding of  heart health and risk factor modification. Participant will state understanding/return demonstration of topics presented as noted by education test scores.  Learning Barriers/Preferences: Learning Barriers/Preferences - 10/05/19 1402      Learning Barriers/Preferences   Learning Barriers  Sight    Learning Preferences  None       General Cardiac Education Topics:  AED/CPR: - Group verbal and written instruction with the use of models to demonstrate the basic use of the AED with the basic ABC's of resuscitation.   Anatomy & Physiology of the Heart: - Group verbal and written instruction and models provide basic cardiac anatomy and physiology, with the coronary electrical  and arterial systems. Review of Valvular disease and Heart Failure   Cardiac Procedures: - Group verbal and written instruction to review commonly prescribed medications for heart disease. Reviews the medication, class of the drug, and side effects. Includes the steps to properly store meds and maintain the prescription regimen. (beta blockers and nitrates)   Cardiac Medications I: - Group verbal and written instruction to review commonly prescribed medications for heart disease. Reviews the medication, class of the drug, and side effects. Includes the steps to properly store meds and maintain the prescription regimen.   Cardiac Medications II: -Group verbal and written instruction to review commonly prescribed medications for heart disease. Reviews the medication, class of the drug, and side effects. (all other drug classes)    Go Sex-Intimacy & Heart Disease, Get SMART - Goal Setting: - Group verbal and written instruction through game format to discuss heart disease and the return to sexual intimacy. Provides group verbal and written material to discuss and apply goal setting through the application of the S.M.A.R.T. Method.   Other Matters of the Heart: - Provides group verbal, written materials and models  to describe Stable Angina and Peripheral Artery. Includes description of the disease process and treatment options available to the cardiac patient.   Infection Prevention: - Provides verbal and written material to individual with discussion of infection control including proper hand washing and proper equipment cleaning during exercise session.   Pulmonary Rehab from 10/09/2019 in [facility] Cardiac and Pulmonary Rehab  Date  10/09/19  Educator  AS  Instruction Review Code  1- Verbalizes Understanding      Falls Prevention: - Provides verbal and written material to individual with discussion of falls prevention and safety.   Pulmonary Rehab from 10/09/2019 in [facility] Cardiac and Pulmonary Rehab  Date  10/09/19  Educator  AS  Instruction Review Code  1- Verbalizes Understanding      Other: -Provides group and verbal instruction on various topics (see comments)   Knowledge Questionnaire Score: Knowledge Questionnaire Score - 10/16/19 1104      Knowledge Questionnaire Score   Pre Score  went over correct answers       Core Components/Risk Factors/Patient Goals at Admission: Personal Goals and Risk Factors at Admission - 10/09/19 1421      Core Components/Risk Factors/Patient Goals on Admission    Weight Management  Yes;Weight Gain    Intervention  Weight Management: Develop a combined nutrition and exercise program designed to reach desired caloric intake, while maintaining appropriate intake of nutrient and fiber, sodium and fats, and appropriate energy expenditure required for the weight goal.;Weight Management: Provide education and appropriate resources to help participant work on and attain dietary goals.;Weight Management/Obesity: Establish reasonable short term and long term weight goals.    Admit Weight  133 lb (60.3 kg)    Goal Weight: Short Term  143 lb (64.9 kg)    Goal Weight: Long Term  150 lb (68 kg)    Expected Outcomes  Short Term: Continue to assess and modify  interventions until short term weight is achieved;Long Term: Adherence to nutrition and physical activity/exercise program aimed toward attainment of established weight goal;Understanding recommendations for meals to include 15-35% energy as protein, 25-35% energy from fat, 35-60% energy from carbohydrates, less than 268m of dietary cholesterol, 20-35 gm of total fiber daily;Understanding of distribution of calorie intake throughout the day with the consumption of 4-5 meals/snacks    Number of packs per day  Patient has quit in January. former 1  pack a day    Intervention  Assist the participant in steps to quit. Provide individualized education and counseling about committing to Tobacco Cessation, relapse prevention, and pharmacological support that can be provided by physician.;Advice worker, assist with locating and accessing local/national Quit Smoking programs, and support quit date choice.    Expected Outcomes  Short Term: Will demonstrate readiness to quit, by selecting a quit date.;Long Term: Complete abstinence from all tobacco products for at least 12 months from quit date.;Short Term: Will quit all tobacco product use, adhering to prevention of relapse plan.    Intervention  Provide education, individualized exercise plan and daily activity instruction to help decrease symptoms of SOB with activities of daily living.    Expected Outcomes  Short Term: Improve cardiorespiratory fitness to achieve a reduction of symptoms when performing ADLs;Long Term: Be able to perform more ADLs without symptoms or delay the onset of symptoms    Heart Failure  Yes    Intervention  Provide a combined exercise and nutrition program that is supplemented with education, support and counseling about heart failure. Directed toward relieving symptoms such as shortness of breath, decreased exercise tolerance, and extremity edema.    Expected Outcomes  Improve functional capacity of life;Short term:  Attendance in program 2-3 days a week with increased exercise capacity. Reported lower sodium intake. Reported increased fruit and vegetable intake. Reports medication compliance.;Short term: Daily weights obtained and reported for increase. Utilizing diuretic protocols set by physician.;Long term: Adoption of self-care skills and reduction of barriers for early signs and symptoms recognition and intervention leading to self-care maintenance.       Education:Diabetes - Individual verbal and written instruction to review signs/symptoms of diabetes, desired ranges of glucose level fasting, after meals and with exercise. Acknowledge that pre and post exercise glucose checks will be done for 3 sessions at entry of program.   Education: Know Your Numbers and Risk Factors: -Group verbal and written instruction about important numbers in your health.  Discussion of what are risk factors and how they play a role in the disease process.  Review of Cholesterol, Blood Pressure, Diabetes, and BMI and the role they play in your overall health.   Core Components/Risk Factors/Patient Goals Review:  Goals and Risk Factor Review    Row Name 11/15/19 1027             Core Components/Risk Factors/Patient Goals Review   Personal Goals Review  Weight Management/Obesity;Improve shortness of breath with ADL's       Review  Millan does not get short of breath as much since he paces hinmself. He has been feeling some improvement since the start of the program. He has managed to gain 2 pounds since the start of the program. He wants to gain more stamina by the end of the program to help his shortness breath.       Expected Outcomes  Short: contiue to attend [facility] regularly. Long: maintain exercise to help with ADLs independently          Core Components/Risk Factors/Patient Goals at Discharge (Final Review):  Goals and Risk Factor Review - 11/15/19 1027      Core Components/Risk Factors/Patient Goals Review    Personal Goals Review  Weight Management/Obesity;Improve shortness of breath with ADL's    Review  Devante does not get short of breath as much since he paces hinmself. He has been feeling some improvement since the start of the program. He has managed to gain 2 pounds  since the start of the program. He wants to gain more stamina by the end of the program to help his shortness breath.    Expected Outcomes  Short: contiue to attend [facility] regularly. Long: maintain exercise to help with ADLs independently       ITP Comments: ITP Comments    Row Name 10/16/19 1024 10/25/19 1029 11/22/19 0632       ITP Comments  First full day of exercise!  Patient was oriented to gym and equipment including functions, settings, policies, and procedures.  Patient's individual exercise prescription and treatment plan were reviewed.  All starting workloads were established based on the results of the 6 minute walk test done at initial orientation visit.  The plan for exercise progression was also introduced and progression will be customized based on patient's performance and goals.  30 day chart review completed. ITP sent to Dr Zachery Dakins Medical Director, for review,changes as needed and signature.  New to program  30 Day review completed. Medical Director review done, changes made as directed,and approval shown by signature of Market researcher.        Comments:

## 2019-11-27 NOTE — Progress Notes (Signed)
Daily Session Note  Patient Details  Name: Todd SANDY Sr. MRN: [id number] Date of Birth: 28-Nov-1940 Referring Provider:     Pulmonary Rehab from 10/09/2019 in [facility] Cardiac and Pulmonary Rehab  Referring Provider  Humphrey Rolls      Encounter Date: 11/27/2019  Check In: Session Check In - 11/27/19 1020      Check-In   Supervising physician immediately available to respond to emergencies  See telemetry face sheet for immediately available ER MD    Staff Present  Renita Papa, RN BSN;Joseph [address] [place], BS, ACSM CEP, Exercise Physiologist;Jessica Woodruff, [place], RCEP, CCRP, CCET    Virtual Visit  No    Medication changes reported      No    Fall or balance concerns reported     No    Warm-up and Cool-down  Performed on first and last piece of equipment    Resistance Training Performed  Yes    VAD Patient?  No    PAD/SET Patient?  No      Pain Assessment   Currently in Pain?  No/denies          Social History   Tobacco Use  Smoking Status Former Smoker  . Packs/day: 1.00  . Years: 64.00  . Pack years: 64.00  . Types: Cigarettes  . Quit date: 08/14/2019  . Years since quitting: 0.2  Smokeless Tobacco Never Used  Tobacco Comment   patient states that he is done smoking    Goals Met:  Independence with exercise equipment Exercise tolerated well No report of cardiac concerns or symptoms Strength training completed today  Goals Unmet:  Not Applicable  Comments: Pt able to follow exercise prescription today without complaint.  Will continue to monitor for progression.    Dr. Emily Filbert is Medical Director for [place] and [facility].

## 2019-11-29 ENCOUNTER — Ambulatory Visit (INDEPENDENT_AMBULATORY_CARE_PROVIDER_SITE_OTHER): Payer: Medicare Other | Admitting: Urology

## 2019-11-29 ENCOUNTER — Encounter: Payer: Self-pay | Admitting: Urology

## 2019-11-29 ENCOUNTER — Other Ambulatory Visit: Payer: Self-pay

## 2019-11-29 VITALS — BP 120/80 | HR 70 | Ht 72.0 in | Wt 125.0 lb

## 2019-11-29 DIAGNOSIS — R972 Elevated prostate specific antigen [PSA]: Secondary | ICD-10-CM | POA: Diagnosis not present

## 2019-11-29 NOTE — Progress Notes (Signed)
11/29/2019 1:45 PM   Todd Kocher Sr. 1940-09-03 [id number]  Referring provider: Jodi Marble, MD [place],  [place] [address]  Chief Complaint  Patient presents with  . Elevated PSA    HPI: Todd Webster is a 79 y.o. male seen at request of Dr. Elijio Miles for evaluation of an elevated PSA.  He had a PSA January 2021 which was elevated at 11.1.  No prior PSA results were available for comparison.  He has mild lower urinary tract symptoms of nocturia x2-3.  Denies dysuria, gross hematuria or flank, abdominal, pelvic pain.  He states he has a history of stones "as a child" but none recently.  His father died of prostate cancer around the age 66.  He had a lost weight from 145-125.  He was having cardiac issues at the time and had a pacemaker placed.  He states he is back to around 140.   PMH: Past Medical History:  Diagnosis Date  . COPD (chronic obstructive pulmonary disease) ([place])     Surgical History: Past Surgical History:  Procedure Laterality Date  . APPENDECTOMY    . PACEMAKER LEADLESS INSERTION N/A 08/16/2019   Procedure: PACEMAKER LEADLESS INSERTION;  Surgeon: Isaias Cowman, MD;  Location: [facility];  Service: Cardiovascular;  Laterality: N/A;    Home Medications:  Allergies as of 11/29/2019      Reactions   Grifulvin V [griseofulvin] Other (See Comments)   Reaction: possible blood in urine      Medication List       Accurate as of November 29, 2019  1:45 PM. If you have any questions, ask your nurse or doctor.        acetaminophen 325 MG tablet Commonly known as: TYLENOL Take 2 tablets (650 mg total) by mouth every 4 (four) hours as needed for headache or mild pain.   Budesonide 90 MCG/ACT inhaler Inhale 1 puff into the lungs 2 (two) times daily.   enalapril 2.5 MG tablet Commonly known as: VASOTEC Enalapril Maleate 2.5 MG Oral Tablet QTY: 30 tablet Days: 30 Refills: 2  Written: 08/21/19 Patient Instructions: once a  day   Ipratropium-Albuterol 20-100 MCG/ACT Aers respimat Commonly known as: COMBIVENT Inhale 1 puff into the lungs every 6 (six) hours.   latanoprost 0.005 % ophthalmic solution Commonly known as: XALATAN Place 1 drop into the left eye at bedtime.   Multiple Vitamins tablet Take 1 tablet by mouth daily at 12 noon.   pantoprazole 40 MG tablet Commonly known as: PROTONIX   Simbrinza 1-0.2 % Susp Generic drug: Brinzolamide-Brimonidine Place 1 drop into both eyes 2 (two) times daily.   vitamin B-12 1000 MCG tablet Commonly known as: CYANOCOBALAMIN Take 1,000 mcg by mouth daily at 12 noon.       Allergies:  Allergies  Allergen Reactions  . Grifulvin V [Griseofulvin] Other (See Comments)    Reaction: possible blood in urine    Family History: No family history on file.  Social History:  reports that he quit smoking about 3 months ago. His smoking use included cigarettes. He has a 64.00 pack-year smoking history. He has never used smokeless tobacco. No history on file for alcohol and drug.   Physical Exam: BP 120/80   Pulse 70   Ht 6' (1.829 m)   Wt 125 lb (56.7 kg)   BMI 16.95 kg/m   Constitutional:  Alert and oriented, No acute distress. HEENT: [place] AT, moist mucus membranes.  Trachea midline, no masses.  Cardiovascular: No clubbing, cyanosis, or edema. Respiratory: Normal respiratory effort, no increased work of breathing. GI: Abdomen is soft, nontender, nondistended, no abdominal masses GU: Prostate 40 g, smooth.  Minimally increased firmness of left. Skin: No rashes, bruises or suspicious lesions. Neurologic: Grossly intact, no focal deficits, moving all 4 extremities. Psychiatric: Normal mood and affect.   Assessment & Plan:    - Elevated PSA Although PSA is a prostate cancer screening test he was informed that cancer is not the most common cause of an elevated PSA. Other potential causes including BPH and inflammation were discussed. He was informed that the  only way to adequately diagnose prostate cancer would be a transrectal ultrasound and biopsy of the prostate. The procedure was discussed including potential risks of bleeding and infection/sepsis. He was also informed that a negative biopsy does not conclusively rule out the possibility that prostate cancer may be present and that continued monitoring is required. The use of newer adjunctive blood tests including PHI and 4kScore were discussed. The use of multiparametric prostate MRI was also discussed however would most likely not be a candidate secondary to his pacemaker. Continued periodic surveillance was also discussed.  Will initially repeat his PSA since it was last drawn January 2021.  If it remains elevated above 10 would recommend prostate biopsy.   Abbie Sons, [place] [address] [place], [place] [address] [phone]

## 2019-11-30 LAB — PSA: Prostate Specific Ag, Serum: 10.9 ng/mL — ABNORMAL HIGH (ref 0.0–4.0)

## 2019-12-01 ENCOUNTER — Encounter: Payer: Self-pay | Admitting: Urology

## 2019-12-04 ENCOUNTER — Telehealth: Payer: Self-pay | Admitting: *Deleted

## 2019-12-04 NOTE — Telephone Encounter (Signed)
Patient aware of results, Patient did not want to schedule appointment with me at the moment. He stated he will call back later today or tomorrow to schedule biopsy

## 2019-12-04 NOTE — Telephone Encounter (Signed)
-----   Message from Abbie Sons, MD sent at 12/03/2019 12:38 PM EDT ----- Repeat PSA remains elevated at 10.9.  Recommend scheduling prostate biopsy.

## 2019-12-06 ENCOUNTER — Other Ambulatory Visit: Payer: Self-pay

## 2019-12-06 ENCOUNTER — Encounter: Payer: Medicare Other | Attending: Cardiovascular Disease | Admitting: *Deleted

## 2019-12-06 DIAGNOSIS — Z7951 Long term (current) use of inhaled steroids: Secondary | ICD-10-CM | POA: Insufficient documentation

## 2019-12-06 DIAGNOSIS — Z79899 Other long term (current) drug therapy: Secondary | ICD-10-CM | POA: Diagnosis not present

## 2019-12-06 DIAGNOSIS — J449 Chronic obstructive pulmonary disease, unspecified: Secondary | ICD-10-CM

## 2019-12-06 DIAGNOSIS — Z87891 Personal history of nicotine dependence: Secondary | ICD-10-CM | POA: Diagnosis not present

## 2019-12-06 NOTE — Progress Notes (Signed)
Daily Session Note  Patient Details  Name: Todd BAUTCH Sr. MRN: [id number] Date of Birth: [birth year] Referring Provider:     Pulmonary Rehab from 10/09/2019 in [facility] Cardiac and Pulmonary Rehab  Referring Provider  Humphrey Rolls      Encounter Date: 12/06/2019  Check In: Session Check In - 12/06/19 1014      Check-In   Supervising physician immediately available to respond to emergencies  See telemetry face sheet for immediately available ER MD    Location  [facility]-Cardiac & Pulmonary Rehab    Staff Present  Alberteen Sam, MA, RCEP, CCRP, CCET;Amanda Sommer, BA, ACSM CEP, Exercise Physiologist;Joseph Hood RCP,RRT,BSRT;Melissa Halfway RDN, LDN   Basilia Jumbo RN, BSN   Virtual Visit  No    Medication changes reported      No    Fall or balance concerns reported     No    Warm-up and Cool-down  Performed on first and last piece of Teacher, music Performed  Yes    VAD Patient?  No    PAD/SET Patient?  No      Pain Assessment   Currently in Pain?  No/denies          Social History   Tobacco Use  Smoking Status Former Smoker  . Packs/day: 1.00  . Years: 64.00  . Pack years: 64.00  . Types: Cigarettes  . Quit date: 08/14/2019  . Years since quitting: 0.3  Smokeless Tobacco Never Used  Tobacco Comment   patient states that he is done smoking    Goals Met:  Independence with exercise equipment Exercise tolerated well No report of cardiac concerns or symptoms  Goals Unmet:  Not Applicable  Comments: Pt able to follow exercise prescription today without complaint.  Will continue to monitor for progression.    Dr. Emily Filbert is Medical Director for [place] and [facility].

## 2019-12-11 ENCOUNTER — Other Ambulatory Visit: Payer: Self-pay

## 2019-12-11 ENCOUNTER — Encounter: Payer: Medicare Other | Admitting: *Deleted

## 2019-12-11 DIAGNOSIS — J449 Chronic obstructive pulmonary disease, unspecified: Secondary | ICD-10-CM

## 2019-12-11 NOTE — Progress Notes (Signed)
Daily Session Note  Patient Details  Name: Todd Webster. MRN: [id number] Date of Birth: 1941-06-19 Referring Provider:     Pulmonary Rehab from 10/09/2019 in [facility] Cardiac and Pulmonary Rehab  Referring Provider  Humphrey Rolls      Encounter Date: 12/11/2019  Check In: Session Check In - 12/11/19 1041      Check-In   Supervising physician immediately available to respond to emergencies  See telemetry face sheet for immediately available ER MD    Location  [facility]-Cardiac & Pulmonary Rehab    Staff Present  Renita Papa, RN Moises Blood, BS, ACSM CEP, Exercise Physiologist;Joseph Foy [place], [place], ACSM CEP, Exercise Physiologist    Virtual Visit  No    Medication changes reported      No    Fall or balance concerns reported     No    Warm-up and Cool-down  Performed on first and last piece of equipment    Resistance Training Performed  Yes    VAD Patient?  No    PAD/SET Patient?  No      Pain Assessment   Currently in Pain?  No/denies          Social History   Tobacco Use  Smoking Status Former Smoker  . Packs/day: 1.00  . Years: 64.00  . Pack years: 64.00  . Types: Cigarettes  . Quit date: 08/14/2019  . Years since quitting: 0.3  Smokeless Tobacco Never Used  Tobacco Comment   patient states that he is done smoking    Goals Met:  Independence with exercise equipment Exercise tolerated well No report of cardiac concerns or symptoms Strength training completed today  Goals Unmet:  Not Applicable  Comments: Pt able to follow exercise prescription today without complaint.  Will continue to monitor for progression.    Dr. Emily Filbert is Medical Director for [place] and [facility].

## 2019-12-13 ENCOUNTER — Other Ambulatory Visit: Payer: Self-pay

## 2019-12-20 ENCOUNTER — Encounter: Payer: Self-pay | Admitting: *Deleted

## 2019-12-20 DIAGNOSIS — J449 Chronic obstructive pulmonary disease, unspecified: Secondary | ICD-10-CM

## 2019-12-20 NOTE — Progress Notes (Signed)
Pulmonary Individual Treatment Plan  Patient Details  Name: Todd FUNARI Sr. MRN: [id number] Date of Birth: 13-Oct-1940 Referring Provider:     Pulmonary Rehab from 10/09/2019 in [facility] Card[facility] Pulmonary Rehab  Referring Provider  Humphrey Rolls      Initial Encounter Date:    Pulmonary Rehab from 10/09/2019 in [facility] Cardiac and Pulmonary Rehab  Date  10/09/19      Visit Diagnosis: Chronic obstructive pulmonary disease, unspecified COPD type ([place])  Patient's Home Medications on Admission:  Current Outpatient Medications:  .  acetaminophen (TYLENOL) 325 MG tablet, Take 2 tablets (650 mg total) by mouth every 4 (four) hours as needed for headache or mild pain., Disp:  , Rfl:  .  Budesonide 90 MCG/ACT inhaler, Inhale 1 puff into the lungs 2 (two) times daily., Disp: 1 each, Rfl: 0 .  enalapril (VASOTEC) 2.5 MG tablet, Enalapril Maleate 2.5 MG Oral Tablet QTY: 30 tablet Days: 30 Refills: 2  Written: 08/21/19 Patient Instructions: once a day, Disp: , Rfl:  .  Ipratropium-Albuterol (COMBIVENT) 20-100 MCG/ACT AERS respimat, Inhale 1 puff into the lungs every 6 (six) hours., Disp: 4 g, Rfl: 0 .  latanoprost (XALATAN) 0.005 % ophthalmic solution, Place 1 drop into the left eye at bedtime., Disp: , Rfl:  .  Multiple Vitamins tablet, Take 1 tablet by mouth daily at 12 noon. , Disp: , Rfl:  .  pantoprazole (PROTONIX) 40 MG tablet, , Disp: , Rfl:  .  SIMBRINZA 1-0.2 % SUSP, Place 1 drop into both eyes 2 (two) times daily., Disp: , Rfl:  .  vitamin B-12 (CYANOCOBALAMIN) 1000 MCG tablet, Take 1,000 mcg by mouth daily at 12 noon. , Disp: , Rfl:   Past Medical History: Past Medical History:  Diagnosis Date  . COPD (chronic obstructive pulmonary disease) (HCC)     Tobacco Use: Social History   Tobacco Use  Smoking Status Former Smoker  . Packs/day: 1.00  . Years: 64.00  . Pack years: 64.00  . Types: Cigarettes  . Quit date: 08/14/2019  . Years since quitting: 0.3  Smokeless Tobacco Never Used   Tobacco Comment   patient states that he is done smoking    Labs: Recent Review Flowsheet Data    There is no flowsheet data to display.       Pulmonary Assessment Scores: Pulmonary Assessment Scores    Row Name 10/09/19 1428         ADL UCSD   SOB Score total  37     Rest  0     Walk  1     Stairs  3     Bath  2     Dress  2     Shop  3       CAT Score   CAT Score  19       mMRC Score   mMRC Score  3        UCSD: Self-administered rating of dyspnea associated with activities of daily living (ADLs) 6-point scale (0 = "not at all" to 5 = "maximal or unable to do because of breathlessness")  Scoring Scores range from 0 to 120.  Minimally important difference is 5 units  CAT: CAT can identify the health impairment of COPD patients and is better correlated with disease progression.  CAT has a scoring range of zero to 40. The CAT score is classified into four groups of low (less than 10), medium (10 - 20), high (21-30) and very high (31-40)  based on the impact level of disease on health status. A CAT score over 10 suggests significant symptoms.  A worsening CAT score could be explained by an exacerbation, poor medication adherence, poor inhaler technique, or progression of COPD or comorbid conditions.  CAT MCID is 2 points  mMRC: mMRC (Modified Medical Research Council) Dyspnea Scale is used to assess the degree of baseline functional disability in patients of respiratory disease due to dyspnea. No minimal important difference is established. A decrease in score of 1 point or greater is considered a positive change.   Pulmonary Function Assessment: Pulmonary Function Assessment - 10/05/19 1400      Breath   Shortness of Breath  Yes;Limiting activity       Exercise Target Goals: Exercise Program Goal: Individual exercise prescription set using results from initial 6 min walk test and THRR while considering  patient's activity barriers and safety.   Exercise  Prescription Goal: Initial exercise prescription builds to 30-45 minutes a day of aerobic activity, 2-3 days per week.  Home exercise guidelines will be given to patient during program as part of exercise prescription that the participant will acknowledge.  Education: Aerobic Exercise & Resistance Training: - Gives group verbal and written instruction on the various components of exercise. Focuses on aerobic and resistive training programs and the benefits of this training and how to safely progress through these programs..   Education: Exercise & Equipment Safety: - Individual verbal instruction and demonstration of equipment use and safety with use of the equipment.   Pulmonary Rehab from 10/09/2019 in [facility] Cardiac and Pulmonary Rehab  Date  10/09/19  Educator  AS  Instruction Review Code  1- Verbalizes Understanding      Education: Exercise Physiology & General Exercise Guidelines: - Group verbal and written instruction with models to review the exercise physiology of the cardiovascular system and associated critical values. Provides general exercise guidelines with specific guidelines to those with heart or lung disease.    Education: Flexibility, Balance, Mind/Body Relaxation: Provides group verbal/written instruction on the benefits of flexibility and balance training, including mind/body exercise modes such as yoga, pilates and tai chi.  Demonstration and skill practice provided.   Activity Barriers & Risk Stratification:   6 Minute Walk: 6 Minute Walk    Row Name 10/09/19 1407         6 Minute Walk   Phase  Initial     Distance  740 feet     Walk Time  5 minutes     # of Rest Breaks  1     MPH  1.68     METS  2.35     RPE  13     Perceived Dyspnea   3     VO2 Peak  8.2     Symptoms  Yes (comment)     Comments  shortness of breath     Resting HR  83 bpm     Resting BP  140/60     Resting Oxygen Saturation   96 %     Exercise Oxygen Saturation  during 6 min walk   91 %     Max Ex. HR  91 bpm     Max Ex. BP  158/50     2 Minute Post BP  132/64       Interval HR   1 Minute HR  87     2 Minute HR  89     3 Minute HR  85  4 Minute HR  71     5 Minute HR  74     6 Minute HR  91     2 Minute Post HR  99     Interval Heart Rate?  Yes       Interval Oxygen   Interval Oxygen?  Yes     Baseline Oxygen Saturation %  96 %     1 Minute Oxygen Saturation %  91 %     1 Minute Liters of Oxygen  0 L     2 Minute Oxygen Saturation %  94 %     2 Minute Liters of Oxygen  0 L     3 Minute Oxygen Saturation %  95 %     3 Minute Liters of Oxygen  0 L     4 Minute Oxygen Saturation %  97 %     4 Minute Liters of Oxygen  0 L     5 Minute Oxygen Saturation %  96 %     5 Minute Liters of Oxygen  0 L     6 Minute Oxygen Saturation %  95 %     6 Minute Liters of Oxygen  0 L     2 Minute Post Oxygen Saturation %  98 %     2 Minute Post Liters of Oxygen  0 L       Oxygen Initial Assessment: Oxygen Initial Assessment - 10/05/19 1400      Home Oxygen   Home Oxygen Device  None    Sleep Oxygen Prescription  None    Home Exercise Oxygen Prescription  None    Home at Rest Exercise Oxygen Prescription  None      Initial 6 min Walk   Oxygen Used  None      Program Oxygen Prescription   Program Oxygen Prescription  None      Intervention   Short Term Goals  To learn and understand importance of monitoring SPO2 with pulse oximeter and demonstrate accurate use of the pulse oximeter.;To learn and understand importance of maintaining oxygen saturations>88%;To learn and demonstrate proper pursed lip breathing techniques or other breathing techniques.;To learn and demonstrate proper use of respiratory medications    Long  Term Goals  Verbalizes importance of monitoring SPO2 with pulse oximeter and return demonstration;Maintenance of O2 saturations>88%;Exhibits proper breathing techniques, such as pursed lip breathing or other method taught during program  session;Compliance with respiratory medication;Demonstrates proper use of MDI's       Oxygen Re-Evaluation: Oxygen Re-Evaluation    Row Name 10/16/19 1029 11/15/19 1017           Program Oxygen Prescription   Program Oxygen Prescription  None  None        Home Oxygen   Home Oxygen Device  None  None      Sleep Oxygen Prescription  None  None      Home Exercise Oxygen Prescription  None  None      Home at Rest Exercise Oxygen Prescription  None  None        Goals/Expected Outcomes   Short Term Goals  To learn and understand importance of monitoring SPO2 with pulse oximeter and demonstrate accurate use of the pulse oximeter.;To learn and understand importance of maintaining oxygen saturations>88%;To learn and demonstrate proper pursed lip breathing techniques or other breathing techniques.;To learn and demonstrate proper use of respiratory medications  To learn and understand importance of monitoring SPO2 with pulse  oximeter and demonstrate accurate use of the pulse oximeter.;To learn and understand importance of maintaining oxygen saturations>88%;To learn and demonstrate proper pursed lip breathing techniques or other breathing techniques.;To learn and demonstrate proper use of respiratory medications      Long  Term Goals  Verbalizes importance of monitoring SPO2 with pulse oximeter and return demonstration;Maintenance of O2 saturations>88%;Exhibits proper breathing techniques, such as pursed lip breathing or other method taught during program session;Compliance with respiratory medication;Demonstrates proper use of MDI's  Verbalizes importance of monitoring SPO2 with pulse oximeter and return demonstration;Maintenance of O2 saturations>88%;Exhibits proper breathing techniques, such as pursed lip breathing or other method taught during program session;Compliance with respiratory medication;Demonstrates proper use of MDI's      Comments  Reviewed PLB technique with pt.  Talked about how it  works and it's importance in maintaining their exercise saturations.  He does not have a pulse oximeter to check his oxygen saturation at home. Informed him where to get one and explained why it is important to have one. Reviewed that oxygen saturations should be 88 percent and above. Patient has to obtain a pulse oximeter at home.      Goals/Expected Outcomes  Short: Become more profiecient at using PLB.   Long: Become independent at using PLB.  Short: Obtain a pulse oximeter, monitor oxygen at home with exertion. Long: maintain oxygen saturations above 88 percent independently.         Oxygen Discharge (Final Oxygen Re-Evaluation): Oxygen Re-Evaluation - 11/15/19 1017      Program Oxygen Prescription   Program Oxygen Prescription  None      Home Oxygen   Home Oxygen Device  None    Sleep Oxygen Prescription  None    Home Exercise Oxygen Prescription  None    Home at Rest Exercise Oxygen Prescription  None      Goals/Expected Outcomes   Short Term Goals  To learn and understand importance of monitoring SPO2 with pulse oximeter and demonstrate accurate use of the pulse oximeter.;To learn and understand importance of maintaining oxygen saturations>88%;To learn and demonstrate proper pursed lip breathing techniques or other breathing techniques.;To learn and demonstrate proper use of respiratory medications    Long  Term Goals  Verbalizes importance of monitoring SPO2 with pulse oximeter and return demonstration;Maintenance of O2 saturations>88%;Exhibits proper breathing techniques, such as pursed lip breathing or other method taught during program session;Compliance with respiratory medication;Demonstrates proper use of MDI's    Comments  He does not have a pulse oximeter to check his oxygen saturation at home. Informed him where to get one and explained why it is important to have one. Reviewed that oxygen saturations should be 88 percent and above. Patient has to obtain a pulse oximeter at  home.    Goals/Expected Outcomes  Short: Obtain a pulse oximeter, monitor oxygen at home with exertion. Long: maintain oxygen saturations above 88 percent independently.       Initial Exercise Prescription: Initial Exercise Prescription - 10/09/19 1400      Date of Initial Exercise RX and Referring Provider   Date  10/09/19    Referring Provider  Humphrey Rolls      Treadmill   MPH  1.6    Grade  0.5    Minutes  15    METs  2.3      NuStep   Level  2    SPM  80    Minutes  15    METs  2.3  Biostep-RELP   Level  2    SPM  50    Minutes  15    METs  2      Prescription Details   Frequency (times per week)  3    Duration  Progress to 30 minutes of continuous aerobic without signs/symptoms of physical distress      Intensity   THRR 40-80% of Max Heartrate  107-131    Ratings of Perceived Exertion  11-13    Perceived Dyspnea  0-4      Resistance Training   Training Prescription  Yes    Weight  3 lb    Reps  10-15       Perform Capillary Blood Glucose checks as needed.  Exercise Prescription Changes: Exercise Prescription Changes    Row Name 10/18/19 1300 11/01/19 1500 11/16/19 1200 11/28/19 1400 12/11/19 1000     Response to Exercise   Blood Pressure (Admit)  104/60  122/70  118/58  126/84  108/62   Blood Pressure (Exercise)  138/58  150/52  136/62  140/80  112/54   Blood Pressure (Exit)  110/60  122/52  120/60  106/64  118/60   Heart Rate (Admit)  71 bpm  70 bpm  71 bpm  71 bpm  108 bpm   Heart Rate (Exercise)  74 bpm  71 bpm  73 bpm  72 bpm  110 bpm   Heart Rate (Exit)  74 bpm  71 bpm  70 bpm  71 bpm  57 bpm   Oxygen Saturation (Admit)  98 %  97 %  97 %  97 %  97 %   Oxygen Saturation (Exercise)  99 %  98 %  96 %  98 %  96 %   Oxygen Saturation (Exit)  97 %  98 %  97 %  98 %  97 %   Rating of Perceived Exertion (Exercise)  '15  16  17  13  15   ' Perceived Dyspnea (Exercise)  0  1  0  0  1   Symptoms  none  none  none  none  none   Comments  first full day of  exercise  --  --  --  --   Duration  Progress to 30 minutes of  aerobic without signs/symptoms of physical distress  Continue with 30 min of aerobic exercise without signs/symptoms of physical distress.  Continue with 30 min of aerobic exercise without signs/symptoms of physical distress.  Continue with 30 min of aerobic exercise without signs/symptoms of physical distress.  Continue with 30 min of aerobic exercise without signs/symptoms of physical distress.   Intensity  THRR unchanged  THRR unchanged  THRR unchanged  THRR unchanged  THRR unchanged     Progression   Progression  Continue to progress workloads to maintain intensity without signs/symptoms of physical distress.  Continue to progress workloads to maintain intensity without signs/symptoms of physical distress.  Continue to progress workloads to maintain intensity without signs/symptoms of physical distress.  Continue to progress workloads to maintain intensity without signs/symptoms of physical distress.  Continue to progress workloads to maintain intensity without signs/symptoms of physical distress.   Average METs  2.15  2.3  2.5  2.7  2.4     Resistance Training   Training Prescription  Yes  Yes  Yes  Yes  Yes   Weight  3 lb  3 lb  3 lb  3 lb  3 lb   Reps  10-15  10-15  10-15  10-15  10-15     Interval Training   Interval Training  No  No  No  No  No     Treadmill   MPH  0.7  0.8  --  --  --   Grade  0  0  --  --  --   Minutes  15  15  --  --  --   METs  1.5  1.6  --  --  --     NuStep   Level  '2  3  3  4  4   ' SPM  --  --  --  80  --   Minutes  '15  15  15  15  30   ' METs  2.'8  3  3  ' 2.7  2.4     Biostep-RELP   Level  --  --  3  --  --   Minutes  --  --  15  --  --   METs  --  --  2.5  --  --      Exercise Comments:   Exercise Goals and Review: Exercise Goals    Row Name 10/09/19 1423             Exercise Goals   Increase Physical Activity  Yes       Intervention  Provide advice, education, support and  counseling about physical activity/exercise needs.;Develop an individualized exercise prescription for aerobic and resistive training based on initial evaluation findings, risk stratification, comorbidities and participant's personal goals.       Expected Outcomes  Short Term: Attend rehab on a regular basis to increase amount of physical activity.;Long Term: Add in home exercise to make exercise part of routine and to increase amount of physical activity.;Long Term: Exercising regularly at least 3-5 days a week.       Increase Strength and Stamina  Yes       Intervention  Provide advice, education, support and counseling about physical activity/exercise needs.;Develop an individualized exercise prescription for aerobic and resistive training based on initial evaluation findings, risk stratification, comorbidities and participant's personal goals.       Expected Outcomes  Short Term: Increase workloads from initial exercise prescription for resistance, speed, and METs.;Short Term: Perform resistance training exercises routinely during rehab and add in resistance training at home;Long Term: Improve cardiorespiratory fitness, muscular endurance and strength as measured by increased METs and functional capacity (6MWT)       Able to understand and use rate of perceived exertion (RPE) scale  Yes       Intervention  Provide education and explanation on how to use RPE scale       Expected Outcomes  Short Term: Able to use RPE daily in rehab to express subjective intensity level;Long Term:  Able to use RPE to guide intensity level when exercising independently       Able to understand and use Dyspnea scale  Yes       Intervention  Provide education and explanation on how to use Dyspnea scale       Expected Outcomes  Short Term: Able to use Dyspnea scale daily in rehab to express subjective sense of shortness of breath during exertion;Long Term: Able to use Dyspnea scale to guide intensity level when exercising  independently       Knowledge and understanding of Target Heart Rate Range (THRR)  Yes       Intervention  Provide education and  explanation of THRR including how the numbers were predicted and where they are located for reference       Expected Outcomes  Short Term: Able to state/look up THRR;Short Term: Able to use daily as guideline for intensity in rehab;Long Term: Able to use THRR to govern intensity when exercising independently       Able to check pulse independently  Yes       Intervention  Provide education and demonstration on how to check pulse in carotid and radial arteries.;Review the importance of being able to check your own pulse for safety during independent exercise       Expected Outcomes  Short Term: Able to explain why pulse checking is important during independent exercise;Long Term: Able to check pulse independently and accurately       Understanding of Exercise Prescription  Yes       Intervention  Provide education, explanation, and written materials on patient's individual exercise prescription       Expected Outcomes  Short Term: Able to explain program exercise prescription;Long Term: Able to explain home exercise prescription to exercise independently          Exercise Goals Re-Evaluation : Exercise Goals Re-Evaluation    Row Name 10/16/19 1026 11/01/19 1501 11/15/19 1021 11/28/19 1417 12/06/19 1013     Exercise Goal Re-Evaluation   Exercise Goals Review  Increase Physical Activity;Able to understand and use rate of perceived exertion (RPE) scale;Knowledge and understanding of Target Heart Rate Range (THRR);Understanding of Exercise Prescription;Able to understand and use Dyspnea scale;Increase Strength and Stamina;Able to check pulse independently  Increase Physical Activity;Increase Strength and Stamina;Understanding of Exercise Prescription  Increase Physical Activity;Increase Strength and Stamina;Understanding of Exercise Prescription  Increase Physical  Activity;Increase Strength and Stamina;Able to understand and use rate of perceived exertion (RPE) scale;Able to understand and use Dyspnea scale;Knowledge and understanding of Target Heart Rate Range (THRR);Able to check pulse independently;Understanding of Exercise Prescription  Increase Physical Activity;Increase Strength and Stamina;Able to understand and use rate of perceived exertion (RPE) scale;Able to understand and use Dyspnea scale;Knowledge and understanding of Target Heart Rate Range (THRR);Able to check pulse independently;Understanding of Exercise Prescription   Comments  Reviewed RPE scale, THR and program prescription with pt today.  Pt voiced understanding and was given a copy of goals to take home.  Todd Webster is off to a good start in rehab.  He is already up to level 4 on the NuStep.  We will continue to monitor his progress.  Patient states that he works still and is active enough where he feels like he doesnt need to exercise. He wants to be gain strength and stamina so he can do his work. Informed him that it would be good to exercsie after the program is done.  Todd Webster has increased level on T4.  Staff will encourage him to increase weight for the strength exercises he can do.  He cannot use L shoulder due to previous injury.  Todd Webster is not exercising on days not at [facility].  He does a lot of walking in ADLs.   Expected Outcomes  Short: Use RPE daily to regulate intensity. Long: Follow program prescription in THR.  Short: Continue to attend regularly Long: Continue to follow program prescription  Short: attend [facility] to increase strength. Long: maintain exercise at home independently  Short:  attend LW consistently  Long:  build strength and stamina  Short:  build up exercise on days not at [place]:  maintain fitness  [place] Name 12/11/19 1044             Exercise Goal Re-Evaluation   Exercise Goals Review  Increase Physical Activity;Increase Strength and Stamina;Understanding of Exercise  Prescription       Comments  Todd Webster is the best he can in rehab.  He pushes himself to attend and to work hard in class.  Most days his RPEs range from 13-15.  We will continue to monitor his progression.       Expected Outcomes  Short: Continue to attend regularly Long: Continue to encourage to add exercise at home          Discharge Exercise Prescription (Final Exercise Prescription Changes): Exercise Prescription Changes - 12/11/19 1000      Response to Exercise   Blood Pressure (Admit)  108/62    Blood Pressure (Exercise)  112/54    Blood Pressure (Exit)  118/60    Heart Rate (Admit)  108 bpm    Heart Rate (Exercise)  110 bpm    Heart Rate (Exit)  57 bpm    Oxygen Saturation (Admit)  97 %    Oxygen Saturation (Exercise)  96 %    Oxygen Saturation (Exit)  97 %    Rating of Perceived Exertion (Exercise)  15    Perceived Dyspnea (Exercise)  1    Symptoms  none    Duration  Continue with 30 min of aerobic exercise without signs/symptoms of physical distress.    Intensity  THRR unchanged      Progression   Progression  Continue to progress workloads to maintain intensity without signs/symptoms of physical distress.    Average METs  2.4      Resistance Training   Training Prescription  Yes    Weight  3 lb    Reps  10-15      Interval Training   Interval Training  No      NuStep   Level  4    Minutes  30    METs  2.4       Nutrition:  Target Goals: Understanding of nutrition guidelines, daily intake of sodium <1571m, cholesterol <2035m calories 30% from fat and 7% or less from saturated fats, daily to have 5 or more servings of fruits and vegetables.  Education: Controlling Sodium/Reading Food Labels -Group verbal and written material supporting the discussion of sodium use in heart healthy nutrition. Review and explanation with models, verbal and written materials for utilization of the food label.   Education: General Nutrition Guidelines/Fats and Fiber: -Group  instruction provided by verbal, written material, models and posters to present the general guidelines for heart healthy nutrition. Gives an explanation and review of dietary fats and fiber.   Biometrics:    Nutrition Therapy Plan and Nutrition Goals: Nutrition Therapy & Goals - 10/30/19 1014      Nutrition Therapy   Diet  high kcal, high pro    Protein (specify units)  90-105g    Fiber  30 grams    Whole Grain Foods  3 servings    Saturated Fats  12 max. grams    Fruits and Vegetables  5 servings/day    Sodium  1.5 grams      Personal Nutrition Goals   Nutrition Goal  LT: increase ADLs be able to go grocery shopping. would like to get back up tp 130-140lbs now 132.9lbs.    Comments  2 cups coffee non-dairy creamer, rice krispies and milk, a fruit cup. Baked potato and  [place] sandwich. OatmealMarella Webster and love it. Usually two meals, hungry during the day, no SOB during or after eating. Pt reports not having fluid problems and not on water pill. Kcal:  1800-2100kcal/day Protein:  90-105g/day. 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 Pt not meeting needs emphasized importance of eating enough kcal and pro. Pulmonary education. Pt reports wife is restricting him. Will continue to f/u      Intervention Plan   Intervention  Prescribe, educate and counsel regarding individualized specific dietary modifications aiming towards targeted core components such as weight, hypertension, lipid management, diabetes, heart failure and other comorbidities.;Nutrition handout(s) given to patient.    Expected Outcomes  Short Term Goal: Understand basic principles of dietary content, such as calories, fat, sodium, cholesterol and nutrients.;Short Term Goal: A plan has been developed with personal nutrition goals set during dietitian appointment.;Long Term Goal: Adherence to prescribed nutrition plan.       Nutrition Assessments: Nutrition Assessments - 10/09/19 1429       MEDFICTS Scores   Pre Score  12       MEDIFICTS Score Key:          ?70 Need to make dietary changes          40-70 Heart Healthy Diet         ? 40 Therapeutic Level Cholesterol Diet  Nutrition Goals Re-Evaluation:   Nutrition Goals Discharge (Final Nutrition Goals Re-Evaluation):   Psychosocial: Target Goals: Acknowledge presence or absence of significant depression and/or stress, maximize coping skills, provide positive support system. Participant is able to verbalize types and ability to use techniques and skills needed for reducing stress and depression.   Education: Depression - Provides group verbal and written instruction on the correlation between heart/lung disease and depressed mood, treatment options, and the stigmas associated with seeking treatment.   Education: Sleep Hygiene -Provides group verbal and written instruction about how sleep can affect your health.  Define sleep hygiene, discuss sleep cycles and impact of sleep habits. Review good sleep hygiene tips.    Education: Stress and Anxiety: - Provides group verbal and written instruction about the health risks of elevated stress and causes of high stress.  Discuss the correlation between heart/lung disease and anxiety and treatment options. Review healthy ways to manage with stress and anxiety.   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      Family Dynamics   Good Support System?  Yes    Comments  He can look to his wife, son and neighbor.      Barriers   Psychosocial barriers to participate in program  There are no identifiable barriers or psychosocial needs.;The patient should benefit from training in stress management and relaxation.      Screening Interventions    Interventions  Encouraged to exercise;To provide support and resources with identified psychosocial needs;Provide feedback about the scores to participant    Expected Outcomes  Short Term goal: Utilizing psychosocial counselor, staff and physician to assist with identification of specific Stressors or current issues interfering with healing process. Setting desired goal for each stressor or current issue identified.;Long Term Goal: Stressors or current issues are controlled or eliminated.;Short Term goal: Identification and review  with participant of any Quality of Life or Depression concerns found by scoring the questionnaire.;Long Term goal: The participant improves quality of Life and PHQ9 Scores as seen by post scores and/or verbalization of changes       Quality of Life Scores:  Scores of 19 and below usually indicate a poorer quality of life in these areas.  A difference of  2-3 points is a clinically meaningful difference.  A difference of 2-3 points in the total score of the Quality of Life Index has been associated with significant improvement in overall quality of life, self-image, physical symptoms, and general health in studies assessing change in quality of life.  PHQ-9: Recent Review Flowsheet Data    Depression screen [facility] 2/9 10/09/2019   Decreased Interest 0   Down, Depressed, Hopeless 0   PHQ - 2 Score 0   Altered sleeping 0   Tired, decreased energy 0   Change in appetite 0   Feeling bad or failure about yourself  0   Trouble concentrating 0   Moving slowly or fidgety/restless 0   Suicidal thoughts 0   PHQ-9 Score 0     Interpretation of Total Score  Total Score Depression Severity:  1-4 = Minimal depression, 5-9 = Mild depression, 10-14 = Moderate depression, 15-19 = Moderately severe depression, 20-27 = Severe depression   Psychosocial Evaluation and Intervention: Psychosocial Evaluation - 10/05/19 1408      Psychosocial Evaluation & Interventions   Interventions   Encouraged to exercise with the program and follow exercise prescription    Comments  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 He has a good family support system.    Expected Outcomes  Short: Attend [facility] stress management education to decrease stress. Long: Maintain exercise Post [facility] to keep stress at a minimum.    Continue Psychosocial Services   Follow up required by staff       Psychosocial Re-Evaluation: Psychosocial Re-Evaluation    Todd Webster Name 11/15/19 1024 12/06/19 1011           Psychosocial Re-Evaluation   Current issues with  Current Stress Concerns  --      Comments  Patient feels physical stress but does not feel any emotional stress. 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  He does get tired at this time of year.  He sleeps very well and rests when needed to help manage stress.      Expected Outcomes  Short: Exericse to keep stress at a minimum. Long: maintain exercise to reduce stress on a daily basis independently.  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  He does get tired at this time of year.  He sleeps very well and rests when needed to help manage stress.    Expected Outcomes  Short:  continue to manage stress Long: maintain positive outlook       Education: Education Goals: Education classes will be provided on a weekly basis, covering required  topics. Participant will state understanding/return demonstration of topics presented.  Learning Barriers/Preferences: Learning Barriers/Preferences - 10/05/19 1402      Learning Barriers/Preferences   Learning Barriers  Sight    Learning Preferences  None       General Pulmonary Education Topics:  Infection Prevention: - Provides verbal and written material to individual with discussion of infection control including proper hand washing and proper equipment cleaning during exercise session.   Pulmonary Rehab from 10/09/2019 in [facility]  Date  10/09/19  Educator  AS  Instruction Review Code  1- Verbalizes Understanding      Falls Prevention: - Provides verbal and written material to individual with discussion of falls prevention and safety.   Pulmonary Rehab from 10/09/2019 in [facility] Re[facility]  Date  10/09/19  Educator  AS  Instruction Review Code  1- Verbalizes Understanding      Chronic Lung Diseases: - Group verbal and written instruction to review updates, respiratory medications, advancements in procedures and treatments. Discuss use of supplemental oxygen including available portable oxygen systems, continuous and intermittent flow rates, concentrators, personal use and safety guidelines. Review proper use of inhaler and spacers. Provide informative websites for self-education.    Energy Conservation: - Provide group verbal and written instruction for methods to conserve energy, plan and organize activities. Instruct on pacing techniques, use of adaptive equipment and posture/positioning to relieve shortness of breath.   Triggers and Exacerbations: - Group verbal and written instruction to review types of environmental triggers and ways to prevent exacerbations. Discuss weather changes, air quality and the benefits of nasal washing. Review warning signs and symptoms to help prevent infections. Discuss techniques for effective airway  clearance, coughing, and vibrations.   AED/CPR: - Group verbal and written instruction with the use of models to demonstrate the basic use of the AED with the basic ABC's of resuscitation.   Anatomy and Physiology of the Lungs: - Group verbal and written instruction with the use of models to provide basic lung anatomy and physiology related to function, structure and complications of lung disease.   Anatomy & Physiology of the Heart: - Group verbal and written instruction and models provide basic cardiac anatomy and physiology, with the coronary electrical and arterial systems. Review of Valvular disease and Heart Failure   Cardiac Medications: - Group verbal and written instruction to review commonly prescribed medications for heart disease. Reviews the medication, class of the drug, and side effects.   Other: -Provides group and verbal instruction on various topics (see comments)   Knowledge Questionnaire Score: Knowledge Questionnaire Score - 10/16/19 1104      Knowledge Questionnaire Score   Pre Score  went over correct answers        Core Components/Risk Factors/Patient Goals at Admission: Personal Goals and Risk Factors at Admission - 10/09/19 1421      Core Components/Risk Factors/Patient Goals on Admission    Weight Management  Yes;Weight Gain    Intervention  Weight Management: Develop a combined nutrition and exercise program designed to reach desired caloric intake, while maintaining appropriate intake of nutrient and fiber, sodium and fats, and appropriate energy expenditure required for the weight goal.;Weight Management: Provide education and appropriate resources to help participant work on and attain dietary goals.;Weight Management/Obesity: Establish reasonable short term and long  term weight goals.    Admit Weight  133 lb (60.3 kg)    Goal Weight: Short Term  143 lb (64.9 kg)    Goal Weight: Long Term  150 lb (68 kg)    Expected Outcomes  Short Term: Continue  to assess and modify interventions until short term weight is achieved;Long Term: Adherence to nutrition and physical activity/exercise program aimed toward attainment of established weight goal;Understanding recommendations for meals to include 15-35% energy as protein, 25-35% energy from fat, 35-60% energy from carbohydrates, less than 247m of dietary cholesterol, 20-35 gm of total fiber daily;Understanding of distribution of calorie intake throughout the day with the consumption of 4-5 meals/snacks    Number of packs per day  Patient has quit in January. former 1 pack a day    Intervention  Assist the participant in steps to quit. Provide individualized education and counseling about committing to Tobacco Cessation, relapse prevention, and pharmacological support that can be provided by physician.;OAdvice worker assist with locating and accessing local/national Quit Smoking programs, and support quit date choice.    Expected Outcomes  Short Term: Will demonstrate readiness to quit, by selecting a quit date.;Long Term: Complete abstinence from all tobacco products for at least 12 months from quit date.;Short Term: Will quit all tobacco product use, adhering to prevention of relapse plan.    Intervention  Provide education, individualized exercise plan and daily activity instruction to help decrease symptoms of SOB with activities of daily living.    Expected Outcomes  Short Term: Improve cardiorespiratory fitness to achieve a reduction of symptoms when performing ADLs;Long Term: Be able to perform more ADLs without symptoms or delay the onset of symptoms    Heart Failure  Yes    Intervention  Provide a combined exercise and nutrition program that is supplemented with education, support and counseling about heart failure. Directed toward relieving symptoms such as shortness of breath, decreased exercise tolerance, and extremity edema.    Expected Outcomes  Improve functional capacity of  life;Short term: Attendance in program 2-3 days a week with increased exercise capacity. Reported lower sodium intake. Reported increased fruit and vegetable intake. Reports medication compliance.;Short term: Daily weights obtained and reported for increase. Utilizing diuretic protocols set by physician.;Long term: Adoption of self-care skills and reduction of barriers for early signs and symptoms recognition and intervention leading to self-care maintenance.       Education:Diabetes - Individual verbal and written instruction to review signs/symptoms of diabetes, desired ranges of glucose level fasting, after meals and with exercise. Acknowledge that pre and post exercise glucose checks will be done for 3 sessions at entry of program.   Education: Know Your Numbers and Risk Factors: -Group verbal and written instruction about important numbers in your health.  Discussion of what are risk factors and how they play a role in the disease process.  Review of Cholesterol, Blood Pressure, Diabetes, and BMI and the role they play in your overall health.   Core Components/Risk Factors/Patient Goals Review:  Goals and Risk Factor Review    Row Name 11/15/19 1027 12/06/19 1009           Core Components/Risk Factors/Patient Goals Review   Personal Goals Review  Weight Management/Obesity;Improve shortness of breath with ADL's  Weight Management/Obesity;Improve shortness of breath with ADL's      Review  LToribiodoes not get short of breath as much since he paces hinmself. He has been feeling some improvement since the start of the program. He  has managed to gain 2 pounds since the start of the program. He wants to gain more stamina by the end of the program to help his shortness breath.  Naveed says he doesnt notice SOB on a dayd to day basis at all.  He uses  PLB regularly.  He takes it slow if he starts to feel short of breath.  The only meds he takes are eye drops and inhalers.      Expected Outcomes   Short: contiue to attend [facility] regularly. Long: maintain exercise to help with ADLs independently  Short: continue to attend [facility] Long: maintain exercise regularly         Core Components/Risk Factors/Patient Goals at Discharge (Final Review):  Goals and Risk Factor Review - 12/06/19 1009      Core Components/Risk Factors/Patient Goals Review   Personal Goals Review  Weight Management/Obesity;Improve shortness of breath with ADL's    Review  Jakorey says he doesnt notice SOB on a dayd to day basis at all.  He uses  PLB regularly.  He takes it slow if he starts to feel short of breath.  The only meds he takes are eye drops and inhalers.    Expected Outcomes  Short: continue to attend LW Long: maintain exercise regularly       ITP Comments: ITP Comments    Row Name 10/16/19 1024 10/25/19 1029 11/22/19 0632 12/20/19 0736     ITP Comments  First full day of exercise!  Patient was oriented to gym and equipment including functions, settings, policies, and procedures.  Patient's individual exercise prescription and treatment plan were reviewed.  All starting workloads were established based on the results of the 6 minute walk test done at initial orientation visit.  The plan for exercise progression was also introduced and progression will be customized based on patient's performance and goals.  30 day chart review completed. ITP sent to Dr Zachery Dakins Medical Director, for review,changes as needed and signature.  New to program  30 Day review completed. Medical Director review done, changes made as directed,and approval shown by signature of Market researcher.  30 Day review completed. ITP review done, changes made as directed,and approval shown by signature of Scientist, research (life sciences).       Comments:

## 2019-12-21 ENCOUNTER — Other Ambulatory Visit: Payer: Self-pay

## 2019-12-21 ENCOUNTER — Ambulatory Visit (INDEPENDENT_AMBULATORY_CARE_PROVIDER_SITE_OTHER): Payer: Medicare Other | Admitting: Urology

## 2019-12-21 ENCOUNTER — Encounter: Payer: Self-pay | Admitting: Urology

## 2019-12-21 ENCOUNTER — Other Ambulatory Visit: Payer: Self-pay | Admitting: Urology

## 2019-12-21 VITALS — BP 128/74 | HR 68 | Ht 72.0 in | Wt 125.0 lb

## 2019-12-21 DIAGNOSIS — R972 Elevated prostate specific antigen [PSA]: Secondary | ICD-10-CM

## 2019-12-21 MED ORDER — GENTAMICIN SULFATE 40 MG/ML IJ SOLN
80.0000 mg | Freq: Once | INTRAMUSCULAR | Status: AC
  Administered 2019-12-21: 80 mg via INTRAMUSCULAR

## 2019-12-21 MED ORDER — LEVOFLOXACIN 500 MG PO TABS
500.0000 mg | ORAL_TABLET | Freq: Once | ORAL | Status: AC
  Administered 2019-12-21: 500 mg via ORAL

## 2019-12-21 NOTE — Progress Notes (Signed)
Prostate Biopsy Procedure   Informed consent was obtained after discussing risks/benefits of the procedure.  A time out was performed to ensure correct patient identity.  Pre-Procedure: - Last PSA Level:10.9 - Gentamicin given prophylactically - Levaquin 500 mg administered PO -Transrectal Ultrasound performed revealing a 42 gm prostate -No significant hypoechoic or median lobe noted  Procedure: - Prostate block performed using 10 cc 1% lidocaine and biopsies taken from sextant areas, a total of 12 under ultrasound guidance.  Post-Procedure: - Patient tolerated the procedure well - He was counseled to seek immediate medical attention if experiences any severe pain, significant bleeding, or fevers - Return in one week to discuss biopsy results  I, Nethusan Sivanesan, am acting as a scribe for Dr. Nicki Reaper C. Sherryl Valido,  I have reviewed the above documentation for accuracy and completeness, and I agree with the above.   Abbie Sons, MD

## 2019-12-24 ENCOUNTER — Encounter: Payer: Self-pay | Admitting: Urology

## 2019-12-26 ENCOUNTER — Telehealth: Payer: Self-pay

## 2019-12-26 NOTE — Telephone Encounter (Signed)
Called pt to check in as haven't seen him at rehab since 12/11/19. LM.

## 2019-12-27 LAB — ANATOMIC PATHOLOGY REPORT

## 2020-01-04 ENCOUNTER — Ambulatory Visit (INDEPENDENT_AMBULATORY_CARE_PROVIDER_SITE_OTHER): Payer: Medicare Other | Admitting: Urology

## 2020-01-04 ENCOUNTER — Encounter: Payer: Self-pay | Admitting: Urology

## 2020-01-04 ENCOUNTER — Other Ambulatory Visit: Payer: Self-pay

## 2020-01-04 VITALS — BP 130/70 | HR 72 | Ht 72.0 in | Wt 125.0 lb

## 2020-01-04 DIAGNOSIS — C61 Malignant neoplasm of prostate: Secondary | ICD-10-CM

## 2020-01-04 NOTE — Progress Notes (Signed)
01/04/2020 1:42 PM   Todd Kocher Sr. Jul 15, 1941 [id number]  Referring provider: No referring provider defined for this encounter.  Chief Complaint  Patient presents with  . Follow-up    Urologic history: 1.  Elevated PSA -11.04 August 2019; repeat 11/2019 10.9   HPI: 79 y.o. male presents for prostate biopsy follow-up  -Prostate volume 42 g -No echogenic abnormalities noted -Standard 12 core biopsies performed -No post biopsy complaints  Pathology: 6/12 cores positive Gleason 3+4/4+3 adenocarcinoma LB, LLM, LA, LLA, RB Gleason 3+4 (20-70%) LLB Gleason 4+3 (20%)   PMH: Past Medical History:  Diagnosis Date  . COPD (chronic obstructive pulmonary disease) ([place])     Surgical History: Past Surgical History:  Procedure Laterality Date  . APPENDECTOMY    . PACEMAKER LEADLESS INSERTION N/A 08/16/2019   Procedure: PACEMAKER LEADLESS INSERTION;  Surgeon: Isaias Cowman, MD;  Location: [facility];  Service: Cardiovascular;  Laterality: N/A;    Home Medications:  Allergies as of 01/04/2020      Reactions   Grifulvin V [griseofulvin] Other (See Comments)   Reaction: possible blood in urine      Medication List       Accurate as of January 04, 2020  1:42 PM. If you have any questions, ask your nurse or doctor.        acetaminophen 325 MG tablet Commonly known as: TYLENOL Take 2 tablets (650 mg total) by mouth every 4 (four) hours as needed for headache or mild pain.   Budesonide 90 MCG/ACT inhaler Inhale 1 puff into the lungs 2 (two) times daily.   enalapril 2.5 MG tablet Commonly known as: VASOTEC Enalapril Maleate 2.5 MG Oral Tablet QTY: 30 tablet Days: 30 Refills: 2  Written: 08/21/19 Patient Instructions: once a day   Ipratropium-Albuterol 20-100 MCG/ACT Aers respimat Commonly known as: COMBIVENT Inhale 1 puff into the lungs every 6 (six) hours.   latanoprost 0.005 % ophthalmic solution Commonly known as: XALATAN Place 1 drop into the  left eye at bedtime.   Multiple Vitamins tablet Take 1 tablet by mouth daily at 12 noon.   pantoprazole 40 MG tablet Commonly known as: PROTONIX   sildenafil 100 MG tablet Commonly known as: VIAGRA Take 100 mg by mouth daily as needed.   Simbrinza 1-0.2 % Susp Generic drug: Brinzolamide-Brimonidine Place 1 drop into both eyes 2 (two) times daily.   vitamin B-12 1000 MCG tablet Commonly known as: CYANOCOBALAMIN Take 1,000 mcg by mouth daily at 12 noon.       Allergies:  Allergies  Allergen Reactions  . Grifulvin V [Griseofulvin] Other (See Comments)    Reaction: possible blood in urine    Family History: No family history on file.  Social History:  reports that he quit smoking about 4 months ago. His smoking use included cigarettes. He has a 64.00 pack-year smoking history. He has never used smokeless tobacco. No history on file for alcohol and drug.   Physical Exam: BP 130/70   Pulse 72   Ht 6' (1.829 m)   Wt 125 lb (56.7 kg)   BMI 16.95 kg/m   Constitutional:  Alert and oriented, No acute distress.   Assessment & Plan:    1. cT1c adenocarcinoma prostate  - NCCN unfavorable intermediate risk; pathology report discussed in detail -Staging evaluation with bone scan and CT abdomen/pelvis -He has a 5-10-year life expectancy and if staging evaluation negative for metastatic disease potential options were discussed including EBRT + ADT; EBRT + brachytherapy +/-ADT  and observation    Abbie Sons, [place] [address], [place], [place] [address] [phone]

## 2020-01-08 ENCOUNTER — Encounter: Payer: Self-pay | Admitting: *Deleted

## 2020-01-08 ENCOUNTER — Telehealth: Payer: Self-pay | Admitting: *Deleted

## 2020-01-08 DIAGNOSIS — J449 Chronic obstructive pulmonary disease, unspecified: Secondary | ICD-10-CM

## 2020-01-08 NOTE — Telephone Encounter (Signed)
Todd Webster has been diagnosed with prostate cancer.  He would like to discharge from the program at this time.  We did let him know about the [facility] program restarting in July and he was interested.  We will send a discharge ITP at this time.

## 2020-01-08 NOTE — Progress Notes (Signed)
Discharge Progress Report  Patient Details  Name: Todd DOBRATZ Sr. MRN: [id number] Date of Birth: June 19, 1941 Referring Provider:     Pulmonary Rehab from 10/09/2019 in [facility]  Referring Provider  Humphrey Rolls       Number of Visits: 15  Reason for Discharge:  Early Exit:  Prostate Cancer  Smoking History:  Social History   Tobacco Use  Smoking Status Former Smoker  . Packs/day: 1.00  . Years: 64.00  . Pack years: 64.00  . Types: Cigarettes  . Quit date: 08/14/2019  . Years since quitting: 0.4  Smokeless Tobacco Never Used  Tobacco Comment   patient states that he is done smoking    Diagnosis:  Chronic obstructive pulmonary disease, unspecified COPD type ([place])  ADL [facility]: Pulmonary Assessment Scores    Row Name 10/09/19 1428         ADL [facility]   SOB Score total  37     Rest  0     Walk  1     Stairs  3     Bath  2     Dress  2     Shop  3       CAT Score   CAT Score  19       mMRC Score   mMRC Score  3        Initial Exercise Prescription: Initial Exercise Prescription - 10/09/19 1400      Date of Initial Exercise RX and Referring Provider   Date  10/09/19    Referring Provider  Humphrey Rolls      Treadmill   MPH  1.6    Grade  0.5    Minutes  15    METs  2.3      NuStep   Level  2    SPM  80    Minutes  15    METs  2.3      Biostep-RELP   Level  2    SPM  50    Minutes  15    METs  2      Prescription Details   Frequency (times per week)  3    Duration  Progress to 30 minutes of continuous aerobic without signs/symptoms of physical distress      Intensity   THRR 40-80% of Max Heartrate  107-131    Ratings of Perceived Exertion  11-13    Perceived Dyspnea  0-4      Resistance Training   Training Prescription  Yes    Weight  3 lb    Reps  10-15       Discharge Exercise Prescription (Final Exercise Prescription Changes): Exercise Prescription Changes - 12/11/19 1000      Response to Exercise   Blood Pressure  (Admit)  108/62    Blood Pressure (Exercise)  112/54    Blood Pressure (Exit)  118/60    Heart Rate (Admit)  108 bpm    Heart Rate (Exercise)  110 bpm    Heart Rate (Exit)  57 bpm    Oxygen Saturation (Admit)  97 %    Oxygen Saturation (Exercise)  96 %    Oxygen Saturation (Exit)  97 %    Rating of Perceived Exertion (Exercise)  15    Perceived Dyspnea (Exercise)  1    Symptoms  none    Duration  Continue with 30 min of aerobic exercise without signs/symptoms of physical distress.    Intensity  THRR unchanged      Progression   Progression  Continue to progress workloads to maintain intensity without signs/symptoms of physical distress.    Average METs  2.4      Resistance Training   Training Prescription  Yes    Weight  3 lb    Reps  10-15      Interval Training   Interval Training  No      NuStep   Level  4    Minutes  30    METs  2.4       Functional Capacity: 6 Minute Walk    Row Name 10/09/19 1407         6 Minute Walk   Phase  Initial     Distance  740 feet     Walk Time  5 minutes     # of Rest Breaks  1     MPH  1.68     METS  2.35     RPE  13     Perceived Dyspnea   3     VO2 Peak  8.2     Symptoms  Yes (comment)     Comments  shortness of breath     Resting HR  83 bpm     Resting BP  140/60     Resting Oxygen Saturation   96 %     Exercise Oxygen Saturation  during 6 min walk  91 %     Max Ex. HR  91 bpm     Max Ex. BP  158/50     2 Minute Post BP  132/64       Interval HR   1 Minute HR  87     2 Minute HR  89     3 Minute HR  85     4 Minute HR  71     5 Minute HR  74     6 Minute HR  91     2 Minute Post HR  99     Interval Heart Rate?  Yes       Interval Oxygen   Interval Oxygen?  Yes     Baseline Oxygen Saturation %  96 %     1 Minute Oxygen Saturation %  91 %     1 Minute Liters of Oxygen  0 L     2 Minute Oxygen Saturation %  94 %     2 Minute Liters of Oxygen  0 L     3 Minute Oxygen Saturation %  95 %     3 Minute Liters  of Oxygen  0 L     4 Minute Oxygen Saturation %  97 %     4 Minute Liters of Oxygen  0 L     5 Minute Oxygen Saturation %  96 %     5 Minute Liters of Oxygen  0 L     6 Minute Oxygen Saturation %  95 %     6 Minute Liters of Oxygen  0 L     2 Minute Post Oxygen Saturation %  98 %     2 Minute Post Liters of Oxygen  0 L        Psychological, QOL, Others - Outcomes: PHQ 2/9: Depression screen PHQ 2/9 10/09/2019  Decreased Interest 0  Down, Depressed, Hopeless 0  PHQ - 2 Score 0  Altered sleeping 0  Tired, decreased energy 0  Change in appetite 0  Feeling bad or failure about yourself  0  Trouble concentrating 0  Moving slowly or fidgety/restless 0  Suicidal thoughts 0  PHQ-9 Score 0    Nutrition: Nutrition Therapy & Goals - 10/30/19 1014      Nutrition Therapy   Diet  high kcal, high pro    Protein (specify units)  90-105g    Fiber  30 grams    Whole Grain Foods  3 servings    Saturated Fats  12 max. grams    Fruits and Vegetables  5 servings/day    Sodium  1.5 grams      Personal Nutrition Goals   Nutrition Goal  LT: increase ADLs be able to go grocery shopping. would like to get back up tp 130-140lbs now 132.9lbs.    Comments  2 cups coffee non-dairy creamer, rice krispies and milk, a fruit cup. Baked potato and [place] sandwich. OatmealMarella Bile and love it. Usually two meals, hungry during the day, no SOB during or after eating. Pt reports not having fluid problems and not on water pill. Kcal:  1800-2100kcal/day Protein:  90-105g/day. 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 Pt not meeting needs emphasized importance of eating enough kcal and pro. Pulmonary education. Pt reports wife is restricting him. Will continue to f/u      Intervention Plan   Intervention  Prescribe, educate and counsel regarding individualized specific dietary modifications aiming towards targeted core components such as weight, hypertension, lipid management,  diabetes, heart failure and other comorbidities.;Nutrition handout(s) given to patient.    Expected Outcomes  Short Term Goal: Understand basic principles of dietary content, such as calories, fat, sodium, cholesterol and nutrients.;Short Term Goal: A plan has been developed with personal nutrition goals set during dietitian appointment.;Long Term Goal: Adherence to prescribed nutrition plan.       Nutrition Discharge: Nutrition Assessments - 10/09/19 1429      MEDFICTS Scores   Pre Score  12       Education Questionnaire Score: Knowledge Questionnaire Score - 10/16/19 1104      Knowledge Questionnaire Score   Pre Score  went over correct answers       Goals reviewed with patient; copy given to patient.

## 2020-01-08 NOTE — Progress Notes (Signed)
Pulmonary Individual Treatment Plan  Patient Details  Name: Todd Webster Sr. MRN: [id number] Date of Birth: 02/11/1941 Referring Provider:     Pulmonary Rehab from 10/09/2019 in [facility] Cardiac and Pulmonary Rehab  Referring Provider  Humphrey Rolls      Initial Encounter Date:    Pulmonary Rehab from 10/09/2019 in [facility] Cardiac and Pulmonary Rehab  Date  10/09/19      Visit Diagnosis: Chronic obstructive pulmonary disease, unspecified COPD type ([place])  Patient's Home Medications on Admission:  Current Outpatient Medications:  .  acetaminophen (TYLENOL) 325 MG tablet, Take 2 tablets (650 mg total) by mouth every 4 (four) hours as needed for headache or mild pain., Disp:  , Rfl:  .  Budesonide 90 MCG/ACT inhaler, Inhale 1 puff into the lungs 2 (two) times daily., Disp: 1 each, Rfl: 0 .  enalapril (VASOTEC) 2.5 MG tablet, Enalapril Maleate 2.5 MG Oral Tablet QTY: 30 tablet Days: 30 Refills: 2  Written: 08/21/19 Patient Instructions: once a day, Disp: , Rfl:  .  Ipratropium-Albuterol (COMBIVENT) 20-100 MCG/ACT AERS respimat, Inhale 1 puff into the lungs every 6 (six) hours., Disp: 4 g, Rfl: 0 .  latanoprost (XALATAN) 0.005 % ophthalmic solution, Place 1 drop into the left eye at bedtime., Disp: , Rfl:  .  Multiple Vitamins tablet, Take 1 tablet by mouth daily at 12 noon. , Disp: , Rfl:  .  pantoprazole (PROTONIX) 40 MG tablet, , Disp: , Rfl:  .  sildenafil (VIAGRA) 100 MG tablet, Take 100 mg by mouth daily as needed., Disp: , Rfl:  .  SIMBRINZA 1-0.2 % SUSP, Place 1 drop into both eyes 2 (two) times daily., Disp: , Rfl:  .  vitamin B-12 (CYANOCOBALAMIN) 1000 MCG tablet, Take 1,000 mcg by mouth daily at 12 noon. , Disp: , Rfl:   Past Medical History: Past Medical History:  Diagnosis Date  . COPD (chronic obstructive pulmonary disease) (HCC)     Tobacco Use: Social History   Tobacco Use  Smoking Status Former Smoker  . Packs/day: 1.00  . Years: 64.00  . Pack years: 64.00  . Types: Cigarettes   . Quit date: 08/14/2019  . Years since quitting: 0.4  Smokeless Tobacco Never Used  Tobacco Comment   patient states that he is done smoking    Labs: Recent Review Flowsheet Data    There is no flowsheet data to display.       Pulmonary Assessment Scores: Pulmonary Assessment Scores    Row Name 10/09/19 1428         ADL UCSD   SOB Score total  37     Rest  0     Walk  1     Stairs  3     Bath  2     Dress  2     Shop  3       CAT Score   CAT Score  19       mMRC Score   mMRC Score  3        UCSD: Self-administered rating of dyspnea associated with activities of daily living (ADLs) 6-point scale (0 = "not at all" to 5 = "maximal or unable to do because of breathlessness")  Scoring Scores range from 0 to 120.  Minimally important difference is 5 units  CAT: CAT can identify the health impairment of COPD patients and is better correlated with disease progression.  CAT has a scoring range of zero to 40. The CAT score is  classified into four groups of low (less than 10), medium (10 - 20), high (21-30) and very high (31-40) based on the impact level of disease on health status. A CAT score over 10 suggests significant symptoms.  A worsening CAT score could be explained by an exacerbation, poor medication adherence, poor inhaler technique, or progression of COPD or comorbid conditions.  CAT MCID is 2 points  mMRC: mMRC (Modified Medical Research Council) Dyspnea Scale is used to assess the degree of baseline functional disability in patients of respiratory disease due to dyspnea. No minimal important difference is established. A decrease in score of 1 point or greater is considered a positive change.   Pulmonary Function Assessment: Pulmonary Function Assessment - 10/05/19 1400      Breath   Shortness of Breath  Yes;Limiting activity       Exercise Target Goals: Exercise Program Goal: Individual exercise prescription set using results from initial 6 min walk test  and THRR while considering  patient's activity barriers and safety.   Exercise Prescription Goal: Initial exercise prescription builds to 30-45 minutes a day of aerobic activity, 2-3 days per week.  Home exercise guidelines will be given to patient during program as part of exercise prescription that the participant will acknowledge.  Education: Aerobic Exercise & Resistance Training: - Gives group verbal and written instruction on the various components of exercise. Focuses on aerobic and resistive training programs and the benefits of this training and how to safely progress through these programs..   Education: Exercise & Equipment Safety: - Individual verbal instruction and demonstration of equipment use and safety with use of the equipment.   Pulmonary Rehab from 10/09/2019 in [facility] Cardiac and Pulmonary Rehab  Date  10/09/19  Educator  AS  Instruction Review Code  1- Verbalizes Understanding      Education: Exercise Physiology & General Exercise Guidelines: - Group verbal and written instruction with models to review the exercise physiology of the cardiovascular system and associated critical values. Provides general exercise guidelines with specific guidelines to those with heart or lung disease.    Education: Flexibility, Balance, Mind/Body Relaxation: Provides group verbal/written instruction on the benefits of flexibility and balance training, including mind/body exercise modes such as yoga, pilates and tai chi.  Demonstration and skill practice provided.   Activity Barriers & Risk Stratification:   6 Minute Walk: 6 Minute Walk    Row Name 10/09/19 1407         6 Minute Walk   Phase  Initial     Distance  740 feet     Walk Time  5 minutes     # of Rest Breaks  1     MPH  1.68     METS  2.35     RPE  13     Perceived Dyspnea   3     VO2 Peak  8.2     Symptoms  Yes (comment)     Comments  shortness of breath     Resting HR  83 bpm     Resting BP  140/60      Resting Oxygen Saturation   96 %     Exercise Oxygen Saturation  during 6 min walk  91 %     Max Ex. HR  91 bpm     Max Ex. BP  158/50     2 Minute Post BP  132/64       Interval HR   1 Minute HR  87  2 Minute HR  89     3 Minute HR  85     4 Minute HR  71     5 Minute HR  74     6 Minute HR  91     2 Minute Post HR  99     Interval Heart Rate?  Yes       Interval Oxygen   Interval Oxygen?  Yes     Baseline Oxygen Saturation %  96 %     1 Minute Oxygen Saturation %  91 %     1 Minute Liters of Oxygen  0 L     2 Minute Oxygen Saturation %  94 %     2 Minute Liters of Oxygen  0 L     3 Minute Oxygen Saturation %  95 %     3 Minute Liters of Oxygen  0 L     4 Minute Oxygen Saturation %  97 %     4 Minute Liters of Oxygen  0 L     5 Minute Oxygen Saturation %  96 %     5 Minute Liters of Oxygen  0 L     6 Minute Oxygen Saturation %  95 %     6 Minute Liters of Oxygen  0 L     2 Minute Post Oxygen Saturation %  98 %     2 Minute Post Liters of Oxygen  0 L       Oxygen Initial Assessment: Oxygen Initial Assessment - 10/05/19 1400      Home Oxygen   Home Oxygen Device  None    Sleep Oxygen Prescription  None    Home Exercise Oxygen Prescription  None    Home at Rest Exercise Oxygen Prescription  None      Initial 6 min Walk   Oxygen Used  None      Program Oxygen Prescription   Program Oxygen Prescription  None      Intervention   Short Term Goals  To learn and understand importance of monitoring SPO2 with pulse oximeter and demonstrate accurate use of the pulse oximeter.;To learn and understand importance of maintaining oxygen saturations>88%;To learn and demonstrate proper pursed lip breathing techniques or other breathing techniques.;To learn and demonstrate proper use of respiratory medications    Long  Term Goals  Verbalizes importance of monitoring SPO2 with pulse oximeter and return demonstration;Maintenance of O2 saturations>88%;Exhibits proper breathing  techniques, such as pursed lip breathing or other method taught during program session;Compliance with respiratory medication;Demonstrates proper use of MDI's       Oxygen Re-Evaluation: Oxygen Re-Evaluation    Row Name 10/16/19 1029 11/15/19 1017           Program Oxygen Prescription   Program Oxygen Prescription  None  None        Home Oxygen   Home Oxygen Device  None  None      Sleep Oxygen Prescription  None  None      Home Exercise Oxygen Prescription  None  None      Home at Rest Exercise Oxygen Prescription  None  None        Goals/Expected Outcomes   Short Term Goals  To learn and understand importance of monitoring SPO2 with pulse oximeter and demonstrate accurate use of the pulse oximeter.;To learn and understand importance of maintaining oxygen saturations>88%;To learn and demonstrate proper pursed lip breathing techniques or other breathing techniques.;To learn  and demonstrate proper use of respiratory medications  To learn and understand importance of monitoring SPO2 with pulse oximeter and demonstrate accurate use of the pulse oximeter.;To learn and understand importance of maintaining oxygen saturations>88%;To learn and demonstrate proper pursed lip breathing techniques or other breathing techniques.;To learn and demonstrate proper use of respiratory medications      Long  Term Goals  Verbalizes importance of monitoring SPO2 with pulse oximeter and return demonstration;Maintenance of O2 saturations>88%;Exhibits proper breathing techniques, such as pursed lip breathing or other method taught during program session;Compliance with respiratory medication;Demonstrates proper use of MDI's  Verbalizes importance of monitoring SPO2 with pulse oximeter and return demonstration;Maintenance of O2 saturations>88%;Exhibits proper breathing techniques, such as pursed lip breathing or other method taught during program session;Compliance with respiratory medication;Demonstrates proper use  of MDI's      Comments  Reviewed PLB technique with pt.  Talked about how it works and it's importance in maintaining their exercise saturations.  He does not have a pulse oximeter to check his oxygen saturation at home. Informed him where to get one and explained why it is important to have one. Reviewed that oxygen saturations should be 88 percent and above. Patient has to obtain a pulse oximeter at home.      Goals/Expected Outcomes  Short: Become more profiecient at using PLB.   Long: Become independent at using PLB.  Short: Obtain a pulse oximeter, monitor oxygen at home with exertion. Long: maintain oxygen saturations above 88 percent independently.         Oxygen Discharge (Final Oxygen Re-Evaluation): Oxygen Re-Evaluation - 11/15/19 1017      Program Oxygen Prescription   Program Oxygen Prescription  None      Home Oxygen   Home Oxygen Device  None    Sleep Oxygen Prescription  None    Home Exercise Oxygen Prescription  None    Home at Rest Exercise Oxygen Prescription  None      Goals/Expected Outcomes   Short Term Goals  To learn and understand importance of monitoring SPO2 with pulse oximeter and demonstrate accurate use of the pulse oximeter.;To learn and understand importance of maintaining oxygen saturations>88%;To learn and demonstrate proper pursed lip breathing techniques or other breathing techniques.;To learn and demonstrate proper use of respiratory medications    Long  Term Goals  Verbalizes importance of monitoring SPO2 with pulse oximeter and return demonstration;Maintenance of O2 saturations>88%;Exhibits proper breathing techniques, such as pursed lip breathing or other method taught during program session;Compliance with respiratory medication;Demonstrates proper use of MDI's    Comments  He does not have a pulse oximeter to check his oxygen saturation at home. Informed him where to get one and explained why it is important to have one. Reviewed that oxygen saturations  should be 88 percent and above. Patient has to obtain a pulse oximeter at home.    Goals/Expected Outcomes  Short: Obtain a pulse oximeter, monitor oxygen at home with exertion. Long: maintain oxygen saturations above 88 percent independently.       Initial Exercise Prescription: Initial Exercise Prescription - 10/09/19 1400      Date of Initial Exercise RX and Referring Provider   Date  10/09/19    Referring Provider  Humphrey Rolls      Treadmill   MPH  1.6    Grade  0.5    Minutes  15    METs  2.3      NuStep   Level  2    SPM  80    Minutes  15    METs  2.3      Biostep-RELP   Level  2    SPM  50    Minutes  15    METs  2      Prescription Details   Frequency (times per week)  3    Duration  Progress to 30 minutes of continuous aerobic without signs/symptoms of physical distress      Intensity   THRR 40-80% of Max Heartrate  107-131    Ratings of Perceived Exertion  11-13    Perceived Dyspnea  0-4      Resistance Training   Training Prescription  Yes    Weight  3 lb    Reps  10-15       Perform Capillary Blood Glucose checks as needed.  Exercise Prescription Changes: Exercise Prescription Changes    Row Name 10/18/19 1300 11/01/19 1500 11/16/19 1200 11/28/19 1400 12/11/19 1000     Response to Exercise   Blood Pressure (Admit)  104/60  122/70  118/58  126/84  108/62   Blood Pressure (Exercise)  138/58  150/52  136/62  140/80  112/54   Blood Pressure (Exit)  110/60  122/52  120/60  106/64  118/60   Heart Rate (Admit)  71 bpm  70 bpm  71 bpm  71 bpm  108 bpm   Heart Rate (Exercise)  74 bpm  71 bpm  73 bpm  72 bpm  110 bpm   Heart Rate (Exit)  74 bpm  71 bpm  70 bpm  71 bpm  57 bpm   Oxygen Saturation (Admit)  98 %  97 %  97 %  97 %  97 %   Oxygen Saturation (Exercise)  99 %  98 %  96 %  98 %  96 %   Oxygen Saturation (Exit)  97 %  98 %  97 %  98 %  97 %   Rating of Perceived Exertion (Exercise)  '15  16  17  13  15   ' Perceived Dyspnea (Exercise)  0  1  0  0  1    Symptoms  none  none  none  none  none   Comments  first full day of exercise  --  --  --  --   Duration  Progress to 30 minutes of  aerobic without signs/symptoms of physical distress  Continue with 30 min of aerobic exercise without signs/symptoms of physical distress.  Continue with 30 min of aerobic exercise without signs/symptoms of physical distress.  Continue with 30 min of aerobic exercise without signs/symptoms of physical distress.  Continue with 30 min of aerobic exercise without signs/symptoms of physical distress.   Intensity  THRR unchanged  THRR unchanged  THRR unchanged  THRR unchanged  THRR unchanged     Progression   Progression  Continue to progress workloads to maintain intensity without signs/symptoms of physical distress.  Continue to progress workloads to maintain intensity without signs/symptoms of physical distress.  Continue to progress workloads to maintain intensity without signs/symptoms of physical distress.  Continue to progress workloads to maintain intensity without signs/symptoms of physical distress.  Continue to progress workloads to maintain intensity without signs/symptoms of physical distress.   Average METs  2.15  2.3  2.5  2.7  2.4     Resistance Training   Training Prescription  Yes  Yes  Yes  Yes  Yes   Weight  3 lb  3 lb  3 lb  3 lb  3 lb   Reps  10-15  10-15  10-15  10-15  10-15     Interval Training   Interval Training  No  No  No  No  No     Treadmill   MPH  0.7  0.8  --  --  --   Grade  0  0  --  --  --   Minutes  15  15  --  --  --   METs  1.5  1.6  --  --  --     NuStep   Level  '2  3  3  4  4   ' SPM  --  --  --  80  --   Minutes  '15  15  15  15  30   ' METs  2.'8  3  3  ' 2.7  2.4     Biostep-RELP   Level  --  --  3  --  --   Minutes  --  --  15  --  --   METs  --  --  2.5  --  --      Exercise Comments:   Exercise Goals and Review: Exercise Goals    Row Name 10/09/19 1423             Exercise Goals   Increase Physical  Activity  Yes       Intervention  Provide advice, education, support and counseling about physical activity/exercise needs.;Develop an individualized exercise prescription for aerobic and resistive training based on initial evaluation findings, risk stratification, comorbidities and participant's personal goals.       Expected Outcomes  Short Term: Attend rehab on a regular basis to increase amount of physical activity.;Long Term: Add in home exercise to make exercise part of routine and to increase amount of physical activity.;Long Term: Exercising regularly at least 3-5 days a week.       Increase Strength and Stamina  Yes       Intervention  Provide advice, education, support and counseling about physical activity/exercise needs.;Develop an individualized exercise prescription for aerobic and resistive training based on initial evaluation findings, risk stratification, comorbidities and participant's personal goals.       Expected Outcomes  Short Term: Increase workloads from initial exercise prescription for resistance, speed, and METs.;Short Term: Perform resistance training exercises routinely during rehab and add in resistance training at home;Long Term: Improve cardiorespiratory fitness, muscular endurance and strength as measured by increased METs and functional capacity (6MWT)       Able to understand and use rate of perceived exertion (RPE) scale  Yes       Intervention  Provide education and explanation on how to use RPE scale       Expected Outcomes  Short Term: Able to use RPE daily in rehab to express subjective intensity level;Long Term:  Able to use RPE to guide intensity level when exercising independently       Able to understand and use Dyspnea scale  Yes       Intervention  Provide education and explanation on how to use Dyspnea scale       Expected Outcomes  Short Term: Able to use Dyspnea scale daily in rehab to express subjective sense of shortness of breath during exertion;Long  Term: Able to use Dyspnea scale to guide intensity level when exercising independently       Knowledge and understanding of Target  Heart Rate Range (THRR)  Yes       Intervention  Provide education and explanation of THRR including how the numbers were predicted and where they are located for reference       Expected Outcomes  Short Term: Able to state/look up THRR;Short Term: Able to use daily as guideline for intensity in rehab;Long Term: Able to use THRR to govern intensity when exercising independently       Able to check pulse independently  Yes       Intervention  Provide education and demonstration on how to check pulse in carotid and radial arteries.;Review the importance of being able to check your own pulse for safety during independent exercise       Expected Outcomes  Short Term: Able to explain why pulse checking is important during independent exercise;Long Term: Able to check pulse independently and accurately       Understanding of Exercise Prescription  Yes       Intervention  Provide education, explanation, and written materials on patient's individual exercise prescription       Expected Outcomes  Short Term: Able to explain program exercise prescription;Long Term: Able to explain home exercise prescription to exercise independently          Exercise Goals Re-Evaluation : Exercise Goals Re-Evaluation    Row Name 10/16/19 1026 11/01/19 1501 11/15/19 1021 11/28/19 1417 12/06/19 1013     Exercise Goal Re-Evaluation   Exercise Goals Review  Increase Physical Activity;Able to understand and use rate of perceived exertion (RPE) scale;Knowledge and understanding of Target Heart Rate Range (THRR);Understanding of Exercise Prescription;Able to understand and use Dyspnea scale;Increase Strength and Stamina;Able to check pulse independently  Increase Physical Activity;Increase Strength and Stamina;Understanding of Exercise Prescription  Increase Physical Activity;Increase Strength and  Stamina;Understanding of Exercise Prescription  Increase Physical Activity;Increase Strength and Stamina;Able to understand and use rate of perceived exertion (RPE) scale;Able to understand and use Dyspnea scale;Knowledge and understanding of Target Heart Rate Range (THRR);Able to check pulse independently;Understanding of Exercise Prescription  Increase Physical Activity;Increase Strength and Stamina;Able to understand and use rate of perceived exertion (RPE) scale;Able to understand and use Dyspnea scale;Knowledge and understanding of Target Heart Rate Range (THRR);Able to check pulse independently;Understanding of Exercise Prescription   Comments  Reviewed RPE scale, THR and program prescription with pt today.  Pt voiced understanding and was given a copy of goals to take home.  Todd Webster is off to a good start in rehab.  He is already up to level 4 on the NuStep.  We will continue to monitor his progress.  Patient states that he works still and is active enough where he feels like he doesnt need to exercise. He wants to be gain strength and stamina so he can do his work. Informed him that it would be good to exercsie after the program is done.  Todd Webster has increased level on T4.  Staff will encourage him to increase weight for the strength exercises he can do.  He cannot use L shoulder due to previous injury.  Todd Webster is not exercising on days not at [facility].  He does a lot of walking in ADLs.   Expected Outcomes  Short: Use RPE daily to regulate intensity. Long: Follow program prescription in THR.  Short: Continue to attend regularly Long: Continue to follow program prescription  Short: attend [facility] to increase strength. Long: maintain exercise at home independently  Short:  attend LW consistently  Long:  build strength and stamina  Short:  build up exercise on days not at [facility]:  maintain fitness   Row Name 12/11/19 1044             Exercise Goal Re-Evaluation   Exercise Goals Review  Increase Physical  Activity;Increase Strength and Stamina;Understanding of Exercise Prescription       Comments  Todd Webster is the best he can in rehab.  He pushes himself to attend and to work hard in class.  Most days his RPEs range from 13-15.  We will continue to monitor his progression.       Expected Outcomes  Short: Continue to attend regularly Long: Continue to encourage to add exercise at home          Discharge Exercise Prescription (Final Exercise Prescription Changes): Exercise Prescription Changes - 12/11/19 1000      Response to Exercise   Blood Pressure (Admit)  108/62    Blood Pressure (Exercise)  112/54    Blood Pressure (Exit)  118/60    Heart Rate (Admit)  108 bpm    Heart Rate (Exercise)  110 bpm    Heart Rate (Exit)  57 bpm    Oxygen Saturation (Admit)  97 %    Oxygen Saturation (Exercise)  96 %    Oxygen Saturation (Exit)  97 %    Rating of Perceived Exertion (Exercise)  15    Perceived Dyspnea (Exercise)  1    Symptoms  none    Duration  Continue with 30 min of aerobic exercise without signs/symptoms of physical distress.    Intensity  THRR unchanged      Progression   Progression  Continue to progress workloads to maintain intensity without signs/symptoms of physical distress.    Average METs  2.4      Resistance Training   Training Prescription  Yes    Weight  3 lb    Reps  10-15      Interval Training   Interval Training  No      NuStep   Level  4    Minutes  30    METs  2.4       Nutrition:  Target Goals: Understanding of nutrition guidelines, daily intake of sodium <1568m, cholesterol <20103m calories 30% from fat and 7% or less from saturated fats, daily to have 5 or more servings of fruits and vegetables.  Education: Controlling Sodium/Reading Food Labels -Group verbal and written material supporting the discussion of sodium use in heart healthy nutrition. Review and explanation with models, verbal and written materials for utilization of the food  label.   Education: General Nutrition Guidelines/Fats and Fiber: -Group instruction provided by verbal, written material, models and posters to present the general guidelines for heart healthy nutrition. Gives an explanation and review of dietary fats and fiber.   Biometrics:    Nutrition Therapy Plan and Nutrition Goals: Nutrition Therapy & Goals - 10/30/19 1014      Nutrition Therapy   Diet  high kcal, high pro    Protein (specify units)  90-105g    Fiber  30 grams    Whole Grain Foods  3 servings    Saturated Fats  12 max. grams    Fruits and Vegetables  5 servings/day    Sodium  1.5 grams      Personal Nutrition Goals   Nutrition Goal  LT: increase ADLs be able to go grocery shopping. would like to get back up tp 130-140lbs now 132.9lbs.  Comments  2 cups coffee non-dairy creamer, rice krispies and milk, a fruit cup. Baked potato and [place] sandwich. OatmealMarella Bile and love it. Usually two meals, hungry during the day, no SOB during or after eating. Pt reports not having fluid problems and not on water pill. Kcal:  1800-2100kcal/day Protein:  90-105g/day. 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 Pt not meeting needs emphasized importance of eating enough kcal and pro. Pulmonary education. Pt reports wife is restricting him. Will continue to f/u      Intervention Plan   Intervention  Prescribe, educate and counsel regarding individualized specific dietary modifications aiming towards targeted core components such as weight, hypertension, lipid management, diabetes, heart failure and other comorbidities.;Nutrition handout(s) given to patient.    Expected Outcomes  Short Term Goal: Understand basic principles of dietary content, such as calories, fat, sodium, cholesterol and nutrients.;Short Term Goal: A plan has been developed with personal nutrition goals set during dietitian appointment.;Long Term Goal: Adherence to prescribed nutrition plan.        Nutrition Assessments: Nutrition Assessments - 10/09/19 1429      MEDFICTS Scores   Pre Score  12       MEDIFICTS Score Key:          ?70 Need to make dietary changes          40-70 Heart Healthy Diet         ? 40 Therapeutic Level Cholesterol Diet  Nutrition Goals Re-Evaluation:   Nutrition Goals Discharge (Final Nutrition Goals Re-Evaluation):   Psychosocial: Target Goals: Acknowledge presence or absence of significant depression and/or stress, maximize coping skills, provide positive support system. Participant is able to verbalize types and ability to use techniques and skills needed for reducing stress and depression.   Education: Depression - Provides group verbal and written instruction on the correlation between heart/lung disease and depressed mood, treatment options, and the stigmas associated with seeking treatment.   Education: Sleep Hygiene -Provides group verbal and written instruction about how sleep can affect your health.  Define sleep hygiene, discuss sleep cycles and impact of sleep habits. Review good sleep hygiene tips.    Education: Stress and Anxiety: - Provides group verbal and written instruction about the health risks of elevated stress and causes of high stress.  Discuss the correlation between heart/lung disease and anxiety and treatment options. Review healthy ways to manage with stress and anxiety.   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      Family Dynamics   Good Support System?  Yes    Comments  He can look to his wife, son and neighbor.      Barriers   Psychosocial barriers to participate in program  There are no identifiable barriers or psychosocial needs.;The patient should benefit from  training in stress management and relaxation.      Screening Interventions   Interventions  Encouraged to exercise;To provide support and resources with identified psychosocial needs;Provide feedback about the scores to participant    Expected Outcomes  Short Term goal: Utilizing psychosocial counselor, staff and physician to assist with identification of specific Stressors or current issues interfering with healing process. Setting desired goal for each stressor or current  issue identified.;Long Term Goal: Stressors or current issues are controlled or eliminated.;Short Term goal: Identification and review with participant of any Quality of Life or Depression concerns found by scoring the questionnaire.;Long Term goal: The participant improves quality of Life and PHQ9 Scores as seen by post scores and/or verbalization of changes       Quality of Life Scores:  Scores of 19 and below usually indicate a poorer quality of life in these areas.  A difference of  2-3 points is a clinically meaningful difference.  A difference of 2-3 points in the total score of the Quality of Life Index has been associated with significant improvement in overall quality of life, self-image, physical symptoms, and general health in studies assessing change in quality of life.  PHQ-9: Recent Review Flowsheet Data    Depression screen [facility] 2/9 10/09/2019   Decreased Interest 0   Down, Depressed, Hopeless 0   PHQ - 2 Score 0   Altered sleeping 0   Tired, decreased energy 0   Change in appetite 0   Feeling bad or failure about yourself  0   Trouble concentrating 0   Moving slowly or fidgety/restless 0   Suicidal thoughts 0   PHQ-9 Score 0     Interpretation of Total Score  Total Score Depression Severity:  1-4 = Minimal depression, 5-9 = Mild depression, 10-14 = Moderate depression, 15-19 = Moderately severe depression, 20-27 = Severe depression   Psychosocial Evaluation and Intervention: Psychosocial Evaluation -  10/05/19 1408      Psychosocial Evaluation & Interventions   Interventions  Encouraged to exercise with the program and follow exercise prescription    Comments  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 He has a good family support system.    Expected Outcomes  Short: Attend [facility] stress management education to decrease stress. Long: Maintain exercise Post [facility] to keep stress at a minimum.    Continue Psychosocial Services   Follow up required by staff       Psychosocial Re-Evaluation: Psychosocial Re-Evaluation    [place] Name 11/15/19 1024 12/06/19 1011           Psychosocial Re-Evaluation   Current issues with  Current Stress Concerns  --      Comments  Patient feels physical stress but does not feel any emotional stress. 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  He does get tired at this time of year.  He sleeps very well and rests when needed to help manage stress.      Expected Outcomes  Short: Exericse to keep stress at a minimum. Long: maintain exercise to reduce stress on a daily basis independently.  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  He does get tired at this time of year.  He sleeps very well and rests when needed to help manage stress.    Expected Outcomes  Short:  continue to manage stress Long: maintain positive outlook       Education: Education  Goals: Education classes will be provided on a weekly basis, covering required topics. Participant will state understanding/return demonstration of topics presented.  Learning Barriers/Preferences: Learning Barriers/Preferences - 10/05/19 1402      Learning Barriers/Preferences   Learning Barriers  Sight    Learning Preferences  None       General Pulmonary Education Topics:  Infection Prevention: - Provides verbal and written material to individual with discussion of infection control including proper hand washing and proper equipment cleaning during exercise session.   Pulmonary Rehab from 10/09/2019 in [facility] Cardiac and Pulmonary Rehab  Date  10/09/19  Educator  AS  Instruction Review Code  1- Verbalizes Understanding      Falls Prevention: - Provides verbal and written material to individual with discussion of falls prevention and safety.   Pulmonary Rehab from 10/09/2019 in [facility] Cardiac and Pulmonary Rehab  Date  10/09/19  Educator  AS  Instruction Review Code  1- Verbalizes Understanding      Chronic Lung Diseases: - Group verbal and written instruction to review updates, respiratory medications, advancements in procedures and treatments. Discuss use of supplemental oxygen including available portable oxygen systems, continuous and intermittent flow rates, concentrators, personal use and safety guidelines. Review proper use of inhaler and spacers. Provide informative websites for self-education.    Energy Conservation: - Provide group verbal and written instruction for methods to conserve energy, plan and organize activities. Instruct on pacing techniques, use of adaptive equipment and posture/positioning to relieve shortness of breath.   Triggers and Exacerbations: - Group verbal and written instruction to review types of environmental triggers and ways to prevent exacerbations. Discuss weather changes, air quality and the benefits of nasal washing. Review warning signs and  symptoms to help prevent infections. Discuss techniques for effective airway clearance, coughing, and vibrations.   AED/CPR: - Group verbal and written instruction with the use of models to demonstrate the basic use of the AED with the basic ABC's of resuscitation.   Anatomy and Physiology of the Lungs: - Group verbal and written instruction with the use of models to provide basic lung anatomy and physiology related to function, structure and complications of lung disease.   Anatomy & Physiology of the Heart: - Group verbal and written instruction and models provide basic cardiac anatomy and physiology, with the coronary electrical and arterial systems. Review of Valvular disease and Heart Failure   Cardiac Medications: - Group verbal and written instruction to review commonly prescribed medications for heart disease. Reviews the medication, class of the drug, and side effects.   Other: -Provides group and verbal instruction on various topics (see comments)   Knowledge Questionnaire Score: Knowledge Questionnaire Score - 10/16/19 1104      Knowledge Questionnaire Score   Pre Score  went over correct answers        Core Components/Risk Factors/Patient Goals at Admission: Personal Goals and Risk Factors at Admission - 10/09/19 1421      Core Components/Risk Factors/Patient Goals on Admission    Weight Management  Yes;Weight Gain    Intervention  Weight Management: Develop a combined nutrition and exercise program designed to reach desired caloric intake, while maintaining appropriate intake of nutrient and fiber, sodium and fats, and appropriate energy expenditure required for the weight goal.;Weight Management: Provide education and  appropriate resources to help participant work on and attain dietary goals.;Weight Management/Obesity: Establish reasonable short term and long term weight goals.    Admit Weight  133 lb (60.3 kg)    Goal Weight: Short Term  143 lb (64.9 kg)    Goal  Weight: Long Term  150 lb (68 kg)    Expected Outcomes  Short Term: Continue to assess and modify interventions until short term weight is achieved;Long Term: Adherence to nutrition and physical activity/exercise program aimed toward attainment of established weight goal;Understanding recommendations for meals to include 15-35% energy as protein, 25-35% energy from fat, 35-60% energy from carbohydrates, less than 275m of dietary cholesterol, 20-35 gm of total fiber daily;Understanding of distribution of calorie intake throughout the day with the consumption of 4-5 meals/snacks    Number of packs per day  Patient has quit in January. former 1 pack a day    Intervention  Assist the participant in steps to quit. Provide individualized education and counseling about committing to Tobacco Cessation, relapse prevention, and pharmacological support that can be provided by physician.;OAdvice worker assist with locating and accessing local/national Quit Smoking programs, and support quit date choice.    Expected Outcomes  Short Term: Will demonstrate readiness to quit, by selecting a quit date.;Long Term: Complete abstinence from all tobacco products for at least 12 months from quit date.;Short Term: Will quit all tobacco product use, adhering to prevention of relapse plan.    Intervention  Provide education, individualized exercise plan and daily activity instruction to help decrease symptoms of SOB with activities of daily living.    Expected Outcomes  Short Term: Improve cardiorespiratory fitness to achieve a reduction of symptoms when performing ADLs;Long Term: Be able to perform more ADLs without symptoms or delay the onset of symptoms    Heart Failure  Yes    Intervention  Provide a combined exercise and nutrition program that is supplemented with education, support and counseling about heart failure. Directed toward relieving symptoms such as shortness of breath, decreased exercise tolerance,  and extremity edema.    Expected Outcomes  Improve functional capacity of life;Short term: Attendance in program 2-3 days a week with increased exercise capacity. Reported lower sodium intake. Reported increased fruit and vegetable intake. Reports medication compliance.;Short term: Daily weights obtained and reported for increase. Utilizing diuretic protocols set by physician.;Long term: Adoption of self-care skills and reduction of barriers for early signs and symptoms recognition and intervention leading to self-care maintenance.       Education:Diabetes - Individual verbal and written instruction to review signs/symptoms of diabetes, desired ranges of glucose level fasting, after meals and with exercise. Acknowledge that pre and post exercise glucose checks will be done for 3 sessions at entry of program.   Education: Know Your Numbers and Risk Factors: -Group verbal and written instruction about important numbers in your health.  Discussion of what are risk factors and how they play a role in the disease process.  Review of Cholesterol, Blood Pressure, Diabetes, and BMI and the role they play in your overall health.   Core Components/Risk Factors/Patient Goals Review:  Goals and Risk Factor Review    Row Name 11/15/19 1027 12/06/19 1009           Core Components/Risk Factors/Patient Goals Review   Personal Goals Review  Weight Management/Obesity;Improve shortness of breath with ADL's  Weight Management/Obesity;Improve shortness of breath with ADL's      Review  LAnesdoes not get short of breath as  much since he paces hinmself. He has been feeling some improvement since the start of the program. He has managed to gain 2 pounds since the start of the program. He wants to gain more stamina by the end of the program to help his shortness breath.  Jules says he doesnt notice SOB on a dayd to day basis at all.  He uses  PLB regularly.  He takes it slow if he starts to feel short of breath.  The  only meds he takes are eye drops and inhalers.      Expected Outcomes  Short: contiue to attend [facility] regularly. Long: maintain exercise to help with ADLs independently  Short: continue to attend [facility] Long: maintain exercise regularly         Core Components/Risk Factors/Patient Goals at Discharge (Final Review):  Goals and Risk Factor Review - 12/06/19 1009      Core Components/Risk Factors/Patient Goals Review   Personal Goals Review  Weight Management/Obesity;Improve shortness of breath with ADL's    Review  Lillie says he doesnt notice SOB on a dayd to day basis at all.  He uses  PLB regularly.  He takes it slow if he starts to feel short of breath.  The only meds he takes are eye drops and inhalers.    Expected Outcomes  Short: continue to attend LW Long: maintain exercise regularly       ITP Comments: ITP Comments    Row Name 10/16/19 1024 10/25/19 1029 11/22/19 0632 12/20/19 0736 01/08/20 1233   ITP Comments  First full day of exercise!  Patient was oriented to gym and equipment including functions, settings, policies, and procedures.  Patient's individual exercise prescription and treatment plan were reviewed.  All starting workloads were established based on the results of the 6 minute walk test done at initial orientation visit.  The plan for exercise progression was also introduced and progression will be customized based on patient's performance and goals.  30 day chart review completed. ITP sent to Dr Zachery Dakins Medical Director, for review,changes as needed and signature.  New to program  30 Day review completed. Medical Director review done, changes made as directed,and approval shown by signature of Market researcher.  30 Day review completed. ITP review done, changes made as directed,and approval shown by signature of Scientist, research (life sciences).  Todd Webster has been diagnosed with prostate cancer.  He would like to discharge from the program at this time.  We did let him know about  the [facility] program restarting in July and he was interested.  We will send a discharge ITP at this time.      Comments: Discharge ITP

## 2020-01-10 ENCOUNTER — Other Ambulatory Visit: Payer: Self-pay

## 2020-01-10 ENCOUNTER — Other Ambulatory Visit
Admission: RE | Admit: 2020-01-10 | Discharge: 2020-01-10 | Disposition: A | Discharge status: Home / Self Care | Payer: Medicare Other | Source: Ambulatory Visit | Attending: General Surgery | Admitting: General Surgery

## 2020-01-10 DIAGNOSIS — Z01812 Encounter for preprocedural laboratory examination: Secondary | ICD-10-CM | POA: Diagnosis present

## 2020-01-10 DIAGNOSIS — Z20822 Contact with and (suspected) exposure to covid-19: Secondary | ICD-10-CM | POA: Insufficient documentation

## 2020-01-10 LAB — SARS CORONAVIRUS 2 (TAT 6-24 HRS): SARS Coronavirus 2: NEGATIVE

## 2020-01-11 ENCOUNTER — Encounter: Payer: Self-pay | Admitting: General Surgery

## 2020-01-12 ENCOUNTER — Ambulatory Visit
Admission: RE | Admit: 2020-01-12 | Discharge: 2020-01-12 | Disposition: A | Discharge status: Home / Self Care | Payer: Medicare Other | Attending: General Surgery | Admitting: General Surgery

## 2020-01-12 ENCOUNTER — Encounter
Admission: RE | Disposition: A | Discharge status: Home / Self Care | Payer: Self-pay | Source: Home / Self Care | Attending: General Surgery

## 2020-01-12 ENCOUNTER — Ambulatory Visit: Payer: Medicare Other | Admitting: Registered Nurse

## 2020-01-12 ENCOUNTER — Encounter: Payer: Self-pay | Admitting: General Surgery

## 2020-01-12 ENCOUNTER — Other Ambulatory Visit: Payer: Self-pay

## 2020-01-12 DIAGNOSIS — Z950 Presence of cardiac pacemaker: Secondary | ICD-10-CM | POA: Diagnosis not present

## 2020-01-12 DIAGNOSIS — K621 Rectal polyp: Secondary | ICD-10-CM | POA: Diagnosis not present

## 2020-01-12 DIAGNOSIS — K921 Melena: Secondary | ICD-10-CM | POA: Diagnosis present

## 2020-01-12 DIAGNOSIS — J449 Chronic obstructive pulmonary disease, unspecified: Secondary | ICD-10-CM | POA: Insufficient documentation

## 2020-01-12 DIAGNOSIS — Z79899 Other long term (current) drug therapy: Secondary | ICD-10-CM | POA: Insufficient documentation

## 2020-01-12 DIAGNOSIS — Z87891 Personal history of nicotine dependence: Secondary | ICD-10-CM | POA: Diagnosis not present

## 2020-01-12 HISTORY — PX: COLONOSCOPY WITH PROPOFOL: SHX5780

## 2020-01-12 HISTORY — DX: Unspecified fracture of skull, initial encounter for closed fracture: S02.91XA

## 2020-01-12 HISTORY — DX: Accidental discharge from unspecified firearms or gun, initial encounter: W34.00XA

## 2020-01-12 HISTORY — DX: Inflammatory liver disease, unspecified: K75.9

## 2020-01-12 SURGERY — COLONOSCOPY WITH PROPOFOL
Anesthesia: General

## 2020-01-12 MED ORDER — SODIUM CHLORIDE 0.9 % IV SOLN
INTRAVENOUS | Status: DC
  Administered 2020-01-12: 1000 mL via INTRAVENOUS

## 2020-01-12 MED ORDER — PROPOFOL 500 MG/50ML IV EMUL
INTRAVENOUS | Status: DC | PRN
  Administered 2020-01-12: 100 ug/kg/min via INTRAVENOUS
  Administered 2020-01-12: 150 ug/kg/min via INTRAVENOUS

## 2020-01-12 MED ORDER — LIDOCAINE HCL (CARDIAC) PF 100 MG/5ML IV SOSY
PREFILLED_SYRINGE | INTRAVENOUS | Status: DC | PRN
  Administered 2020-01-12: 40 mg via INTRAVENOUS

## 2020-01-12 MED ORDER — PROPOFOL 10 MG/ML IV BOLUS
INTRAVENOUS | Status: DC | PRN
  Administered 2020-01-12: 60 mg via INTRAVENOUS
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
## 2020-01-12 NOTE — Op Note (Signed)
[facility] Gastroenterology Patient Name: Todd Webster Procedure Date: 01/12/2020 9:32 AM MRN: [id number] Account #: [id number] Date of Birth: 1940/09/19 Admit Type: Outpatient Age: 79 Room: [facility] ENDO ROOM 1 Gender: Male Note Status: Finalized Procedure:             Colonoscopy Indications:           Heme positive stool Providers:             Robert Bellow, MD Referring MD:          Venetia Maxon. Elijio Miles, MD (Referring MD) Medicines:             Monitored Anesthesia Care Complications:         No immediate complications. Procedure:             Pre-Anesthesia Assessment:                        - Prior to the procedure, a History and Physical was                         performed, and patient medications, allergies and                         sensitivities were reviewed. The patient's tolerance                         of previous anesthesia was reviewed.                        - The risks and benefits of the procedure and the                         sedation options and risks were discussed with the                         patient. All questions were answered and informed                         consent was obtained.                        After obtaining informed consent, the colonoscope was                         passed under direct vision. Throughout the procedure,                         the patient's blood pressure, pulse, and oxygen                         saturations were monitored continuously. The                         Colonoscope was introduced through the anus and                         advanced to the the cecum, identified by appendiceal                         orifice and ileocecal valve.  The colonoscopy was                         performed without difficulty. The patient tolerated                         the procedure well. The quality of the bowel                         preparation was excellent. Findings:      A 12 mm polyp was found in  the rectum. The polyp was semi-pedunculated.       The polyp was removed with a hot snare. Resection and retrieval were       complete.      The retroflexed view of the distal rectum and anal verge was normal and       showed no anal or rectal abnormalities. Impression:            - One 12 mm polyp in the rectum, removed with a hot                         snare. Resected and retrieved.                        - The distal rectum and anal verge are normal on                         retroflexion view. Recommendation:        - Telephone endoscopist for pathology results in 1                         week. Procedure Code(s):     --- Professional ---                        [phone], Colonoscopy, flexible; with removal of                         tumor(s), polyp(s), or other lesion(s) by snare                         technique Diagnosis Code(s):     --- Professional ---                        K62.1, Rectal polyp                        R19.5, Other fecal abnormalities CPT copyright 2019 American [employer] Association. All rights reserved. The codes documented in this report are preliminary and upon coder review may  be revised to meet current compliance requirements. Robert Bellow, MD 01/12/2020 10:21:47 AM This report has been signed electronically. Number of Addenda: 0 Note Initiated On: 01/12/2020 9:32 AM Scope Withdrawal Time: 0 hours 14 minutes 7 seconds  Total Procedure Duration: 0 hours 20 minutes 35 seconds  Estimated Blood Loss:  Estimated blood loss: none.      [facility]

## 2020-01-12 NOTE — Transfer of Care (Signed)
Immediate Anesthesia Transfer of Care Note  Patient: Todd RUSSOM Sr.  Procedure(s) Performed: Procedure(s): COLONOSCOPY WITH PROPOFOL (N/A)  Patient Location: PACU and Endoscopy Unit  Anesthesia Type:General  Level of Consciousness: sedated  Airway & Oxygen Therapy: Patient Spontanous Breathing and Patient connected to nasal cannula oxygen  Post-op Assessment: Report given to RN and Post -op Vital signs reviewed and stable  Post vital signs: Reviewed and stable  Last Vitals:  Vitals:   01/12/20 0942 01/12/20 1023  BP: (!) 148/70 126/85  Pulse: 72 72  Resp: 18 16  Temp: (!) 36.3 C   SpO2: 78% 58%    Complications: No apparent anesthesia complications

## 2020-01-12 NOTE — Anesthesia Procedure Notes (Signed)
Date/Time: 01/12/2020 9:54 AM Performed by: Doreen Salvage, CRNA Pre-anesthesia Checklist: Patient identified, Emergency Drugs available, Suction available and Patient being monitored Patient Re-evaluated:Patient Re-evaluated prior to induction Oxygen Delivery Method: Nasal cannula Induction Type: IV induction Dental Injury: Teeth and Oropharynx as per pre-operative assessment  Comments: Nasal cannula with etCO2 monitoring

## 2020-01-14 NOTE — Anesthesia Postprocedure Evaluation (Signed)
Anesthesia Post Note  Patient: JOSHAWN CRISSMAN Sr.  Procedure(s) Performed: COLONOSCOPY WITH PROPOFOL (N/A )  Patient location during evaluation: PACU Anesthesia Type: General Level of consciousness: awake and alert Pain management: pain level controlled Vital Signs Assessment: post-procedure vital signs reviewed and stable Respiratory status: spontaneous breathing, nonlabored ventilation, respiratory function stable and patient connected to nasal cannula oxygen Cardiovascular status: blood pressure returned to baseline and stable Postop Assessment: no apparent nausea or vomiting Anesthetic complications: no   No complications documented.   Last Vitals:  Vitals:   01/12/20 0942 01/12/20 1023  BP: (!) 148/70 126/85  Pulse: 72 72  Resp: 18 16  Temp: (!) 36.3 C (!) 36.2 C  SpO2: 99% 98%    Last Pain:  Vitals:   01/12/20 1043  TempSrc:   PainSc: 0-No pain                 Molli Barrows

## 2020-01-15 ENCOUNTER — Encounter: Payer: Self-pay | Admitting: General Surgery

## 2020-01-15 LAB — SURGICAL PATHOLOGY

## 2020-01-24 ENCOUNTER — Encounter
Admission: RE | Admit: 2020-01-24 | Discharge: 2020-01-24 | Disposition: A | Discharge status: Home / Self Care | Payer: Medicare Other | Source: Ambulatory Visit | Attending: Urology | Admitting: Urology

## 2020-01-24 ENCOUNTER — Telehealth: Payer: Self-pay

## 2020-01-24 ENCOUNTER — Other Ambulatory Visit: Payer: Self-pay

## 2020-01-24 ENCOUNTER — Ambulatory Visit
Admission: RE | Admit: 2020-01-24 | Discharge: 2020-01-24 | Disposition: A | Discharge status: Home / Self Care | Payer: Medicare Other | Source: Ambulatory Visit | Attending: Urology | Admitting: Urology

## 2020-01-24 DIAGNOSIS — C61 Malignant neoplasm of prostate: Secondary | ICD-10-CM

## 2020-01-24 LAB — POCT I-STAT CREATININE: Creatinine, Ser: 0.8 mg/dL (ref 0.61–1.24)

## 2020-01-24 MED ORDER — IOHEXOL 300 MG/ML  SOLN
100.0000 mL | Freq: Once | INTRAMUSCULAR | Status: AC | PRN
  Administered 2020-01-24: 100 mL via INTRAVENOUS

## 2020-01-24 MED ORDER — TECHNETIUM TC 99M MEDRONATE IV KIT
20.0000 | PACK | Freq: Once | INTRAVENOUS | Status: AC | PRN
  Administered 2020-01-24: 22.803 via INTRAVENOUS

## 2020-01-24 NOTE — Telephone Encounter (Signed)
[facility] radiology called to note IMPRESSION 3.

## 2020-01-29 ENCOUNTER — Telehealth: Payer: Self-pay

## 2020-01-29 DIAGNOSIS — C61 Malignant neoplasm of prostate: Secondary | ICD-10-CM

## 2020-01-29 NOTE — Telephone Encounter (Signed)
Patient called wanting to know CTscan and Bone scan results? Does he need an apt for follow up to discuss or a video visit?
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
## 2020-02-02 ENCOUNTER — Encounter: Payer: Self-pay | Admitting: Emergency Medicine

## 2020-02-02 ENCOUNTER — Other Ambulatory Visit: Payer: Self-pay

## 2020-02-02 ENCOUNTER — Emergency Department
Admission: EM | Admit: 2020-02-02 | Discharge: 2020-02-02 | Disposition: A | Discharge status: Home / Self Care | Payer: Medicare Other | Attending: Emergency Medicine | Admitting: Emergency Medicine

## 2020-02-02 ENCOUNTER — Emergency Department: Payer: Medicare Other

## 2020-02-02 DIAGNOSIS — Z8739 Personal history of other diseases of the musculoskeletal system and connective tissue: Secondary | ICD-10-CM | POA: Insufficient documentation

## 2020-02-02 DIAGNOSIS — J449 Chronic obstructive pulmonary disease, unspecified: Secondary | ICD-10-CM | POA: Insufficient documentation

## 2020-02-02 DIAGNOSIS — Z87891 Personal history of nicotine dependence: Secondary | ICD-10-CM | POA: Diagnosis not present

## 2020-02-02 DIAGNOSIS — M62830 Muscle spasm of back: Secondary | ICD-10-CM | POA: Diagnosis not present

## 2020-02-02 DIAGNOSIS — Z9889 Other specified postprocedural states: Secondary | ICD-10-CM | POA: Diagnosis not present

## 2020-02-02 DIAGNOSIS — M546 Pain in thoracic spine: Secondary | ICD-10-CM | POA: Insufficient documentation

## 2020-02-02 DIAGNOSIS — M545 Low back pain: Secondary | ICD-10-CM | POA: Diagnosis present

## 2020-02-02 MED ORDER — PREDNISONE 50 MG PO TABS
50.0000 mg | ORAL_TABLET | Freq: Every day | ORAL | 0 refills | Status: DC
Start: 2020-02-02 — End: 2020-05-01

## 2020-02-02 MED ORDER — DEXAMETHASONE SODIUM PHOSPHATE 10 MG/ML IJ SOLN
10.0000 mg | Freq: Once | INTRAMUSCULAR | Status: AC
  Administered 2020-02-02: 10 mg via INTRAMUSCULAR
  Filled 2020-02-02: qty 1

## 2020-02-02 MED ORDER — METHOCARBAMOL 500 MG PO TABS: 500.0000 mg | ORAL_TABLET | Freq: Four times a day (QID) | ORAL | 1 refills | Status: DC
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
## 2020-02-12 ENCOUNTER — Other Ambulatory Visit: Payer: Self-pay | Admitting: *Deleted

## 2020-02-12 ENCOUNTER — Other Ambulatory Visit: Payer: Self-pay

## 2020-02-12 ENCOUNTER — Ambulatory Visit
Admission: RE | Admit: 2020-02-12 | Discharge: 2020-02-12 | Disposition: A | Discharge status: Home / Self Care | Payer: Medicare Other | Source: Ambulatory Visit | Attending: Radiation Oncology | Admitting: Radiation Oncology

## 2020-02-12 VITALS — BP 121/59 | HR 70 | Temp 98.5°F | Wt 139.0 lb

## 2020-02-12 DIAGNOSIS — N529 Male erectile dysfunction, unspecified: Secondary | ICD-10-CM | POA: Diagnosis not present

## 2020-02-12 DIAGNOSIS — C61 Malignant neoplasm of prostate: Secondary | ICD-10-CM | POA: Diagnosis not present

## 2020-02-12 DIAGNOSIS — J449 Chronic obstructive pulmonary disease, unspecified: Secondary | ICD-10-CM | POA: Insufficient documentation

## 2020-02-12 DIAGNOSIS — Z87891 Personal history of nicotine dependence: Secondary | ICD-10-CM | POA: Insufficient documentation

## 2020-02-12 DIAGNOSIS — Z79899 Other long term (current) drug therapy: Secondary | ICD-10-CM | POA: Diagnosis not present

## 2020-02-12 MED ORDER — TAMSULOSIN HCL 0.4 MG PO CAPS
0.4000 mg | ORAL_CAPSULE | Freq: Every day | ORAL | 6 refills | Status: DC
Start: 2020-02-12 — End: 2020-05-01
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
## 2020-02-13 ENCOUNTER — Telehealth: Payer: Self-pay

## 2020-02-13 NOTE — Telephone Encounter (Signed)
Coverage determination form faxed today for Eligard approval.

## 2020-02-22 ENCOUNTER — Other Ambulatory Visit: Payer: Self-pay

## 2020-02-22 ENCOUNTER — Telehealth: Payer: Self-pay | Admitting: *Deleted

## 2020-02-22 ENCOUNTER — Encounter: Payer: Self-pay | Admitting: Urology

## 2020-02-22 ENCOUNTER — Ambulatory Visit (INDEPENDENT_AMBULATORY_CARE_PROVIDER_SITE_OTHER): Payer: Medicare Other | Admitting: Urology

## 2020-02-22 VITALS — BP 128/74 | HR 80 | Ht 72.0 in | Wt 139.0 lb

## 2020-02-22 DIAGNOSIS — C61 Malignant neoplasm of prostate: Secondary | ICD-10-CM | POA: Diagnosis not present

## 2020-02-22 MED ORDER — GENTAMICIN SULFATE 40 MG/ML IJ SOLN
80.0000 mg | Freq: Once | INTRAMUSCULAR | Status: AC
  Administered 2020-02-22: 80 mg via INTRAMUSCULAR

## 2020-02-22 MED ORDER — LEVOFLOXACIN 500 MG PO TABS
500.0000 mg | ORAL_TABLET | Freq: Once | ORAL | Status: AC
  Administered 2020-02-22: 500 mg via ORAL

## 2020-02-22 MED ORDER — LEUPROLIDE ACETATE (6 MONTH) 45 MG ~~LOC~~ KIT
45.0000 mg | PACK | Freq: Once | SUBCUTANEOUS | Status: AC
  Administered 2020-02-22: 45 mg via SUBCUTANEOUS

## 2020-02-22 NOTE — Progress Notes (Signed)
02/22/2020  CC: gold fiducial markers  HPI: 79 y.o. male with prostate cancer who presents today for placement of fiducial seed markers in anticipation of his upcoming IMRT with Dr. Baruch Gouty.  Prostate Gold Seed Marker Placement Procedure   Informed consent was obtained after discussing risks/benefits of the procedure.  A time out was performed to ensure correct patient identity.  Pre-Procedure: - Gentamicin given prophylactically - PO Levaquin 500 mg also given today  Procedure: -Lidocaine jelly was administered per rectum -Rectal ultrasound probe was placed without difficulty and the prostate visualized - 3 gold markers placed, one at right base, one at left base, one at apex of prostate gland under transrectal ultrasound guidance  Post-Procedure: - Patient tolerated the procedure well - He was counseled to seek immediate medical attention if he experiences any severe pain, significant bleeding, or fevers -Follow-up with radiation oncology -Received leuprolide today, most common side effects of hot flashes, fatigue discussed -We will need follow-up imaging of indeterminate left renal mass around October 2021  John Giovanni, MD

## 2020-02-22 NOTE — Telephone Encounter (Signed)
Cyril Mourning called from [employer] regarding PA, please return call.

## 2020-02-22 NOTE — Telephone Encounter (Signed)
Called Kristen, no answer LM to call back. 3rd attempt.

## 2020-02-22 NOTE — Progress Notes (Signed)
Eligard SubQ Injection   Due to Prostate Cancer patient is present today for a Eligard Injection.  Medication: Eligard 57month Dose: 45mg   Location: right  Lot: [id number] Exp: 07/2021 Patient tolerated well, no complications were noted  Performed by: Gaspar Cola CMA  Per Dr. Baruch Gouty patient is to continue therapy for 24 months. Patient's next follow up was scheduled for This appointment was scheduled using wheel and given to patient today along with reminder continue on Vitamin D 800-1000iu and Calium 1000-1200mg  daily while on Androgen Deprivation Therapy.  PA approval dates:

## 2020-02-25 ENCOUNTER — Encounter: Payer: Self-pay | Admitting: Urology

## 2020-02-26 ENCOUNTER — Ambulatory Visit: Payer: Medicare Other | Attending: Radiation Oncology

## 2020-02-26 DIAGNOSIS — C61 Malignant neoplasm of prostate: Secondary | ICD-10-CM | POA: Insufficient documentation

## 2020-02-27 ENCOUNTER — Other Ambulatory Visit: Payer: Self-pay | Admitting: *Deleted

## 2020-02-27 DIAGNOSIS — C61 Malignant neoplasm of prostate: Secondary | ICD-10-CM | POA: Diagnosis not present

## 2020-02-27 NOTE — Telephone Encounter (Signed)
No PA required for Eligard through patient's medicare or [employer].

## 2020-02-29 ENCOUNTER — Other Ambulatory Visit: Payer: Self-pay | Admitting: *Deleted

## 2020-02-29 DIAGNOSIS — C61 Malignant neoplasm of prostate: Secondary | ICD-10-CM

## 2020-03-06 ENCOUNTER — Ambulatory Visit: Admission: RE | Admit: 2020-03-06 | Payer: Medicare Other | Source: Ambulatory Visit

## 2020-03-07 ENCOUNTER — Ambulatory Visit
Admission: RE | Admit: 2020-03-07 | Discharge: 2020-03-07 | Disposition: A | Discharge status: Home / Self Care | Payer: Medicare Other | Source: Ambulatory Visit | Attending: Radiation Oncology | Admitting: Radiation Oncology

## 2020-03-07 DIAGNOSIS — C61 Malignant neoplasm of prostate: Secondary | ICD-10-CM | POA: Insufficient documentation

## 2020-03-08 ENCOUNTER — Ambulatory Visit
Admission: RE | Admit: 2020-03-08 | Discharge: 2020-03-08 | Disposition: A | Discharge status: Home / Self Care | Payer: Medicare Other | Source: Ambulatory Visit | Attending: Radiation Oncology | Admitting: Radiation Oncology

## 2020-03-08 DIAGNOSIS — C61 Malignant neoplasm of prostate: Secondary | ICD-10-CM | POA: Diagnosis not present

## 2020-03-11 ENCOUNTER — Ambulatory Visit
Admission: RE | Admit: 2020-03-11 | Discharge: 2020-03-11 | Disposition: A | Discharge status: Home / Self Care | Payer: Medicare Other | Source: Ambulatory Visit | Attending: Radiation Oncology | Admitting: Radiation Oncology

## 2020-03-11 DIAGNOSIS — C61 Malignant neoplasm of prostate: Secondary | ICD-10-CM | POA: Diagnosis not present

## 2020-03-12 ENCOUNTER — Ambulatory Visit
Admission: RE | Admit: 2020-03-12 | Discharge: 2020-03-12 | Disposition: A | Discharge status: Home / Self Care | Payer: Medicare Other | Source: Ambulatory Visit | Attending: Radiation Oncology | Admitting: Radiation Oncology

## 2020-03-12 DIAGNOSIS — C61 Malignant neoplasm of prostate: Secondary | ICD-10-CM | POA: Diagnosis not present

## 2020-03-13 ENCOUNTER — Ambulatory Visit
Admission: RE | Admit: 2020-03-13 | Discharge: 2020-03-13 | Disposition: A | Discharge status: Home / Self Care | Payer: Medicare Other | Source: Ambulatory Visit | Attending: Radiation Oncology | Admitting: Radiation Oncology

## 2020-03-13 DIAGNOSIS — C61 Malignant neoplasm of prostate: Secondary | ICD-10-CM | POA: Diagnosis not present

## 2020-03-14 ENCOUNTER — Ambulatory Visit
Admission: RE | Admit: 2020-03-14 | Discharge: 2020-03-14 | Disposition: A | Discharge status: Home / Self Care | Payer: Medicare Other | Source: Ambulatory Visit | Attending: Radiation Oncology | Admitting: Radiation Oncology

## 2020-03-14 DIAGNOSIS — C61 Malignant neoplasm of prostate: Secondary | ICD-10-CM | POA: Diagnosis not present

## 2020-03-15 ENCOUNTER — Ambulatory Visit
Admission: RE | Admit: 2020-03-15 | Discharge: 2020-03-15 | Disposition: A | Discharge status: Home / Self Care | Payer: Medicare Other | Source: Ambulatory Visit | Attending: Radiation Oncology | Admitting: Radiation Oncology

## 2020-03-15 DIAGNOSIS — C61 Malignant neoplasm of prostate: Secondary | ICD-10-CM | POA: Diagnosis not present

## 2020-03-18 ENCOUNTER — Ambulatory Visit
Admission: RE | Admit: 2020-03-18 | Discharge: 2020-03-18 | Disposition: A | Discharge status: Home / Self Care | Payer: Medicare Other | Source: Ambulatory Visit | Attending: Radiation Oncology | Admitting: Radiation Oncology

## 2020-03-18 DIAGNOSIS — C61 Malignant neoplasm of prostate: Secondary | ICD-10-CM | POA: Diagnosis not present

## 2020-03-19 ENCOUNTER — Ambulatory Visit
Admission: RE | Admit: 2020-03-19 | Discharge: 2020-03-19 | Disposition: A | Discharge status: Home / Self Care | Payer: Medicare Other | Source: Ambulatory Visit | Attending: Radiation Oncology | Admitting: Radiation Oncology

## 2020-03-19 DIAGNOSIS — C61 Malignant neoplasm of prostate: Secondary | ICD-10-CM | POA: Diagnosis not present

## 2020-03-20 ENCOUNTER — Ambulatory Visit
Admission: RE | Admit: 2020-03-20 | Discharge: 2020-03-20 | Disposition: A | Discharge status: Home / Self Care | Payer: Medicare Other | Source: Ambulatory Visit | Attending: Radiation Oncology | Admitting: Radiation Oncology

## 2020-03-20 DIAGNOSIS — C61 Malignant neoplasm of prostate: Secondary | ICD-10-CM | POA: Diagnosis not present

## 2020-03-21 ENCOUNTER — Other Ambulatory Visit: Payer: Self-pay

## 2020-03-21 ENCOUNTER — Ambulatory Visit
Admission: RE | Admit: 2020-03-21 | Discharge: 2020-03-21 | Disposition: A | Discharge status: Home / Self Care | Payer: Medicare Other | Source: Ambulatory Visit | Attending: Radiation Oncology | Admitting: Radiation Oncology

## 2020-03-21 ENCOUNTER — Inpatient Hospital Stay: Payer: Medicare Other | Attending: Radiation Oncology

## 2020-03-21 DIAGNOSIS — C61 Malignant neoplasm of prostate: Secondary | ICD-10-CM | POA: Diagnosis present

## 2020-03-21 LAB — CBC
HCT: 36.1 % — ABNORMAL LOW (ref 39.0–52.0)
Hemoglobin: 12.4 g/dL — ABNORMAL LOW (ref 13.0–17.0)
MCH: 33 pg (ref 26.0–34.0)
MCHC: 34.3 g/dL (ref 30.0–36.0)
MCV: 96 fL (ref 80.0–100.0)
Platelets: 262 10*3/uL (ref 150–400)
RBC: 3.76 MIL/uL — ABNORMAL LOW (ref 4.22–5.81)
RDW: 13.2 % (ref 11.5–15.5)
WBC: 6.4 10*3/uL (ref 4.0–10.5)
nRBC: 0 % (ref 0.0–0.2)

## 2020-03-22 ENCOUNTER — Ambulatory Visit
Admission: RE | Admit: 2020-03-22 | Discharge: 2020-03-22 | Disposition: A | Discharge status: Home / Self Care | Payer: Medicare Other | Source: Ambulatory Visit | Attending: Radiation Oncology | Admitting: Radiation Oncology

## 2020-03-22 DIAGNOSIS — C61 Malignant neoplasm of prostate: Secondary | ICD-10-CM | POA: Diagnosis not present

## 2020-03-25 ENCOUNTER — Ambulatory Visit
Admission: RE | Admit: 2020-03-25 | Discharge: 2020-03-25 | Disposition: A | Discharge status: Home / Self Care | Payer: Medicare Other | Source: Ambulatory Visit | Attending: Radiation Oncology | Admitting: Radiation Oncology

## 2020-03-25 DIAGNOSIS — C61 Malignant neoplasm of prostate: Secondary | ICD-10-CM | POA: Diagnosis not present

## 2020-03-26 ENCOUNTER — Ambulatory Visit
Admission: RE | Admit: 2020-03-26 | Discharge: 2020-03-26 | Disposition: A | Discharge status: Home / Self Care | Payer: Medicare Other | Source: Ambulatory Visit | Attending: Radiation Oncology | Admitting: Radiation Oncology

## 2020-03-26 DIAGNOSIS — C61 Malignant neoplasm of prostate: Secondary | ICD-10-CM | POA: Diagnosis not present

## 2020-03-27 ENCOUNTER — Ambulatory Visit
Admission: RE | Admit: 2020-03-27 | Discharge: 2020-03-27 | Disposition: A | Discharge status: Home / Self Care | Payer: Medicare Other | Source: Ambulatory Visit | Attending: Radiation Oncology | Admitting: Radiation Oncology

## 2020-03-27 DIAGNOSIS — C61 Malignant neoplasm of prostate: Secondary | ICD-10-CM | POA: Diagnosis not present

## 2020-03-28 ENCOUNTER — Ambulatory Visit
Admission: RE | Admit: 2020-03-28 | Discharge: 2020-03-28 | Disposition: A | Discharge status: Home / Self Care | Payer: Medicare Other | Source: Ambulatory Visit | Attending: Radiation Oncology | Admitting: Radiation Oncology

## 2020-03-28 DIAGNOSIS — C61 Malignant neoplasm of prostate: Secondary | ICD-10-CM | POA: Diagnosis not present

## 2020-03-29 ENCOUNTER — Ambulatory Visit
Admission: RE | Admit: 2020-03-29 | Discharge: 2020-03-29 | Disposition: A | Discharge status: Home / Self Care | Payer: Medicare Other | Source: Ambulatory Visit | Attending: Radiation Oncology | Admitting: Radiation Oncology

## 2020-03-29 DIAGNOSIS — C61 Malignant neoplasm of prostate: Secondary | ICD-10-CM | POA: Diagnosis not present

## 2020-04-01 ENCOUNTER — Ambulatory Visit
Admission: RE | Admit: 2020-04-01 | Discharge: 2020-04-01 | Disposition: A | Discharge status: Home / Self Care | Payer: Medicare Other | Source: Ambulatory Visit | Attending: Radiation Oncology | Admitting: Radiation Oncology

## 2020-04-01 DIAGNOSIS — C61 Malignant neoplasm of prostate: Secondary | ICD-10-CM | POA: Diagnosis not present

## 2020-04-02 ENCOUNTER — Ambulatory Visit
Admission: RE | Admit: 2020-04-02 | Discharge: 2020-04-02 | Disposition: A | Discharge status: Home / Self Care | Payer: Medicare Other | Source: Ambulatory Visit | Attending: Radiation Oncology | Admitting: Radiation Oncology

## 2020-04-02 DIAGNOSIS — C61 Malignant neoplasm of prostate: Secondary | ICD-10-CM | POA: Diagnosis not present

## 2020-04-03 ENCOUNTER — Ambulatory Visit
Admission: RE | Admit: 2020-04-03 | Discharge: 2020-04-03 | Disposition: A | Discharge status: Home / Self Care | Payer: Medicare Other | Source: Ambulatory Visit | Attending: Radiation Oncology | Admitting: Radiation Oncology

## 2020-04-03 DIAGNOSIS — C61 Malignant neoplasm of prostate: Secondary | ICD-10-CM | POA: Diagnosis present

## 2020-04-04 ENCOUNTER — Inpatient Hospital Stay: Payer: Medicare Other | Attending: Radiation Oncology

## 2020-04-04 ENCOUNTER — Ambulatory Visit
Admission: RE | Admit: 2020-04-04 | Discharge: 2020-04-04 | Disposition: A | Discharge status: Home / Self Care | Payer: Medicare Other | Source: Ambulatory Visit | Attending: Radiation Oncology | Admitting: Radiation Oncology

## 2020-04-04 ENCOUNTER — Other Ambulatory Visit: Payer: Self-pay

## 2020-04-04 DIAGNOSIS — C61 Malignant neoplasm of prostate: Secondary | ICD-10-CM | POA: Insufficient documentation

## 2020-04-04 LAB — CBC
HCT: 35.6 % — ABNORMAL LOW (ref 39.0–52.0)
Hemoglobin: 12.3 g/dL — ABNORMAL LOW (ref 13.0–17.0)
MCH: 32.8 pg (ref 26.0–34.0)
MCHC: 34.6 g/dL (ref 30.0–36.0)
MCV: 94.9 fL (ref 80.0–100.0)
Platelets: 218 10*3/uL (ref 150–400)
RBC: 3.75 MIL/uL — ABNORMAL LOW (ref 4.22–5.81)
RDW: 13.2 % (ref 11.5–15.5)
WBC: 6.3 10*3/uL (ref 4.0–10.5)
nRBC: 0 % (ref 0.0–0.2)

## 2020-04-05 ENCOUNTER — Ambulatory Visit
Admission: RE | Admit: 2020-04-05 | Discharge: 2020-04-05 | Disposition: A | Discharge status: Home / Self Care | Payer: Medicare Other | Source: Ambulatory Visit | Attending: Radiation Oncology | Admitting: Radiation Oncology

## 2020-04-05 DIAGNOSIS — C61 Malignant neoplasm of prostate: Secondary | ICD-10-CM | POA: Diagnosis not present

## 2020-04-09 ENCOUNTER — Ambulatory Visit
Admission: RE | Admit: 2020-04-09 | Discharge: 2020-04-09 | Disposition: A | Discharge status: Home / Self Care | Payer: Medicare Other | Source: Ambulatory Visit | Attending: Radiation Oncology | Admitting: Radiation Oncology

## 2020-04-09 DIAGNOSIS — C61 Malignant neoplasm of prostate: Secondary | ICD-10-CM | POA: Diagnosis not present

## 2020-04-10 ENCOUNTER — Ambulatory Visit
Admission: RE | Admit: 2020-04-10 | Discharge: 2020-04-10 | Disposition: A | Discharge status: Home / Self Care | Payer: Medicare Other | Source: Ambulatory Visit | Attending: Radiation Oncology | Admitting: Radiation Oncology

## 2020-04-10 DIAGNOSIS — C61 Malignant neoplasm of prostate: Secondary | ICD-10-CM | POA: Diagnosis not present

## 2020-04-11 ENCOUNTER — Ambulatory Visit
Admission: RE | Admit: 2020-04-11 | Discharge: 2020-04-11 | Disposition: A | Discharge status: Home / Self Care | Payer: Medicare Other | Source: Ambulatory Visit | Attending: Radiation Oncology | Admitting: Radiation Oncology

## 2020-04-11 DIAGNOSIS — C61 Malignant neoplasm of prostate: Secondary | ICD-10-CM | POA: Diagnosis not present

## 2020-04-12 ENCOUNTER — Ambulatory Visit
Admission: RE | Admit: 2020-04-12 | Discharge: 2020-04-12 | Disposition: A | Discharge status: Home / Self Care | Payer: Medicare Other | Source: Ambulatory Visit | Attending: Radiation Oncology | Admitting: Radiation Oncology

## 2020-04-12 DIAGNOSIS — C61 Malignant neoplasm of prostate: Secondary | ICD-10-CM | POA: Diagnosis not present

## 2020-04-15 ENCOUNTER — Ambulatory Visit
Admission: RE | Admit: 2020-04-15 | Discharge: 2020-04-15 | Disposition: A | Discharge status: Home / Self Care | Payer: Medicare Other | Source: Ambulatory Visit | Attending: Radiation Oncology | Admitting: Radiation Oncology

## 2020-04-15 DIAGNOSIS — C61 Malignant neoplasm of prostate: Secondary | ICD-10-CM | POA: Diagnosis not present

## 2020-04-16 ENCOUNTER — Ambulatory Visit
Admission: RE | Admit: 2020-04-16 | Discharge: 2020-04-16 | Disposition: A | Discharge status: Home / Self Care | Payer: Medicare Other | Source: Ambulatory Visit | Attending: Radiation Oncology | Admitting: Radiation Oncology

## 2020-04-16 DIAGNOSIS — C61 Malignant neoplasm of prostate: Secondary | ICD-10-CM | POA: Diagnosis not present

## 2020-04-17 ENCOUNTER — Ambulatory Visit
Admission: RE | Admit: 2020-04-17 | Discharge: 2020-04-17 | Disposition: A | Discharge status: Home / Self Care | Payer: Medicare Other | Source: Ambulatory Visit | Attending: Radiation Oncology | Admitting: Radiation Oncology

## 2020-04-17 DIAGNOSIS — C61 Malignant neoplasm of prostate: Secondary | ICD-10-CM | POA: Diagnosis not present

## 2020-04-18 ENCOUNTER — Ambulatory Visit
Admission: RE | Admit: 2020-04-18 | Discharge: 2020-04-18 | Disposition: A | Discharge status: Home / Self Care | Payer: Medicare Other | Source: Ambulatory Visit | Attending: Radiation Oncology | Admitting: Radiation Oncology

## 2020-04-18 ENCOUNTER — Inpatient Hospital Stay: Payer: Medicare Other

## 2020-04-18 ENCOUNTER — Other Ambulatory Visit: Payer: Self-pay

## 2020-04-18 DIAGNOSIS — C61 Malignant neoplasm of prostate: Secondary | ICD-10-CM | POA: Diagnosis not present

## 2020-04-18 LAB — CBC
HCT: 37.2 % — ABNORMAL LOW (ref 39.0–52.0)
Hemoglobin: 12.7 g/dL — ABNORMAL LOW (ref 13.0–17.0)
MCH: 32.7 pg (ref 26.0–34.0)
MCHC: 34.1 g/dL (ref 30.0–36.0)
MCV: 95.9 fL (ref 80.0–100.0)
Platelets: 202 10*3/uL (ref 150–400)
RBC: 3.88 MIL/uL — ABNORMAL LOW (ref 4.22–5.81)
RDW: 13.2 % (ref 11.5–15.5)
WBC: 8.7 10*3/uL (ref 4.0–10.5)
nRBC: 0 % (ref 0.0–0.2)

## 2020-04-19 ENCOUNTER — Ambulatory Visit
Admission: RE | Admit: 2020-04-19 | Discharge: 2020-04-19 | Disposition: A | Discharge status: Home / Self Care | Payer: Medicare Other | Source: Ambulatory Visit | Attending: Radiation Oncology | Admitting: Radiation Oncology

## 2020-04-19 DIAGNOSIS — C61 Malignant neoplasm of prostate: Secondary | ICD-10-CM | POA: Diagnosis not present

## 2020-04-22 ENCOUNTER — Ambulatory Visit
Admission: RE | Admit: 2020-04-22 | Discharge: 2020-04-22 | Disposition: A | Discharge status: Home / Self Care | Payer: Medicare Other | Source: Ambulatory Visit | Attending: Radiation Oncology | Admitting: Radiation Oncology

## 2020-04-22 DIAGNOSIS — C61 Malignant neoplasm of prostate: Secondary | ICD-10-CM | POA: Diagnosis not present

## 2020-04-23 ENCOUNTER — Ambulatory Visit
Admission: RE | Admit: 2020-04-23 | Discharge: 2020-04-23 | Disposition: A | Discharge status: Home / Self Care | Payer: Medicare Other | Source: Ambulatory Visit | Attending: Radiation Oncology | Admitting: Radiation Oncology

## 2020-04-23 DIAGNOSIS — C61 Malignant neoplasm of prostate: Secondary | ICD-10-CM | POA: Diagnosis not present

## 2020-04-24 ENCOUNTER — Ambulatory Visit
Admission: RE | Admit: 2020-04-24 | Discharge: 2020-04-24 | Disposition: A | Discharge status: Home / Self Care | Payer: Medicare Other | Source: Ambulatory Visit | Attending: Radiation Oncology | Admitting: Radiation Oncology

## 2020-04-24 DIAGNOSIS — C61 Malignant neoplasm of prostate: Secondary | ICD-10-CM | POA: Diagnosis not present

## 2020-04-25 ENCOUNTER — Ambulatory Visit
Admission: RE | Admit: 2020-04-25 | Discharge: 2020-04-25 | Disposition: A | Discharge status: Home / Self Care | Payer: Medicare Other | Source: Ambulatory Visit | Attending: Radiation Oncology | Admitting: Radiation Oncology

## 2020-04-25 DIAGNOSIS — C61 Malignant neoplasm of prostate: Secondary | ICD-10-CM | POA: Diagnosis not present

## 2020-04-26 ENCOUNTER — Ambulatory Visit
Admission: RE | Admit: 2020-04-26 | Discharge: 2020-04-26 | Disposition: A | Discharge status: Home / Self Care | Payer: Medicare Other | Source: Ambulatory Visit | Attending: Radiation Oncology | Admitting: Radiation Oncology

## 2020-04-26 DIAGNOSIS — C61 Malignant neoplasm of prostate: Secondary | ICD-10-CM | POA: Diagnosis not present

## 2020-04-29 ENCOUNTER — Ambulatory Visit
Admission: RE | Admit: 2020-04-29 | Discharge: 2020-04-29 | Disposition: A | Discharge status: Home / Self Care | Payer: Medicare Other | Source: Ambulatory Visit | Attending: Radiation Oncology | Admitting: Radiation Oncology

## 2020-04-29 DIAGNOSIS — C61 Malignant neoplasm of prostate: Secondary | ICD-10-CM | POA: Diagnosis not present

## 2020-04-30 ENCOUNTER — Emergency Department
Admission: EM | Admit: 2020-04-30 | Discharge: 2020-05-01 | Disposition: A | Discharge status: Home / Self Care | Payer: Medicare Other | Attending: Emergency Medicine | Admitting: Emergency Medicine

## 2020-04-30 ENCOUNTER — Ambulatory Visit
Admission: RE | Admit: 2020-04-30 | Discharge: 2020-04-30 | Disposition: A | Discharge status: Home / Self Care | Payer: Medicare Other | Source: Ambulatory Visit | Attending: Radiation Oncology | Admitting: Radiation Oncology

## 2020-04-30 ENCOUNTER — Other Ambulatory Visit: Payer: Self-pay

## 2020-04-30 DIAGNOSIS — J449 Chronic obstructive pulmonary disease, unspecified: Secondary | ICD-10-CM | POA: Insufficient documentation

## 2020-04-30 DIAGNOSIS — Z87891 Personal history of nicotine dependence: Secondary | ICD-10-CM | POA: Diagnosis not present

## 2020-04-30 DIAGNOSIS — C61 Malignant neoplasm of prostate: Secondary | ICD-10-CM | POA: Diagnosis not present

## 2020-04-30 DIAGNOSIS — Z79899 Other long term (current) drug therapy: Secondary | ICD-10-CM | POA: Insufficient documentation

## 2020-04-30 DIAGNOSIS — I959 Hypotension, unspecified: Secondary | ICD-10-CM

## 2020-04-30 LAB — URINALYSIS, COMPLETE (UACMP) WITH MICROSCOPIC
Bacteria, UA: NONE SEEN
Bilirubin Urine: NEGATIVE
Glucose, UA: NEGATIVE mg/dL
Hgb urine dipstick: NEGATIVE
Ketones, ur: 5 mg/dL — AB
Leukocytes,Ua: NEGATIVE
Nitrite: NEGATIVE
Protein, ur: NEGATIVE mg/dL
Specific Gravity, Urine: 1.02 (ref 1.005–1.030)
Squamous Epithelial / HPF: NONE SEEN (ref 0–5)
pH: 5 (ref 5.0–8.0)

## 2020-04-30 LAB — BASIC METABOLIC PANEL
Anion gap: 7 (ref 5–15)
BUN: 18 mg/dL (ref 8–23)
CO2: 24 mmol/L (ref 22–32)
Calcium: 8.9 mg/dL (ref 8.9–10.3)
Chloride: 100 mmol/L (ref 98–111)
Creatinine, Ser: 1.28 mg/dL — ABNORMAL HIGH (ref 0.61–1.24)
GFR calc Af Amer: >60 mL/min (ref >60)
GFR calc non Af Amer: 53 mL/min — ABNORMAL LOW (ref >60)
Glucose, Bld: 112 mg/dL — ABNORMAL HIGH (ref 70–99)
Potassium: 3.8 mmol/L (ref 3.5–5.1)
Sodium: 131 mmol/L — ABNORMAL LOW (ref 135–145)

## 2020-04-30 LAB — CBC
HCT: 34.2 % — ABNORMAL LOW (ref 39.0–52.0)
Hemoglobin: 12.4 g/dL — ABNORMAL LOW (ref 13.0–17.0)
MCH: 33.7 pg (ref 26.0–34.0)
MCHC: 36.3 g/dL — ABNORMAL HIGH (ref 30.0–36.0)
MCV: 92.9 fL (ref 80.0–100.0)
Platelets: 251 10*3/uL (ref 150–400)
RBC: 3.68 MIL/uL — ABNORMAL LOW (ref 4.22–5.81)
RDW: 13.2 % (ref 11.5–15.5)
WBC: 5.3 10*3/uL (ref 4.0–10.5)
nRBC: 0 % (ref 0.0–0.2)

## 2020-04-30 LAB — LACTIC ACID, PLASMA: Lactic Acid, Venous: 1.1 mmol/L (ref 0.5–1.9)

## 2020-04-30 LAB — TROPONIN I (HIGH SENSITIVITY): Troponin I (High Sensitivity): 9 ng/L (ref <18)

## 2020-04-30 MED ORDER — SODIUM CHLORIDE 0.9 % IV BOLUS
1000.0000 mL | Freq: Once | INTRAVENOUS | Status: AC
  Administered 2020-04-30: 1000 mL via INTRAVENOUS

## 2020-04-30 NOTE — Discharge Instructions (Addendum)
Please follow-up with your regular doctor and return for any further problems.  Make sure you are drinking enough fluids.
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
## 2020-04-30 NOTE — ED Triage Notes (Signed)
Pt comes from [facility] with c/o hypotension. Per [place] pt's BP-76/48. RR-24, 95%RA  Pt denies any complaints. Pt states MD wanted him given fluids for dehydration.  Current BP-102/50  Pt states he thinks he is dehydrated because he hadn't drank much today because he had ben running around running errands.

## 2020-05-01 ENCOUNTER — Telehealth: Payer: Self-pay | Admitting: *Deleted

## 2020-05-01 ENCOUNTER — Other Ambulatory Visit: Payer: Self-pay | Admitting: *Deleted

## 2020-05-01 ENCOUNTER — Ambulatory Visit
Admission: RE | Admit: 2020-05-01 | Discharge: 2020-05-01 | Disposition: A | Discharge status: Home / Self Care | Payer: Medicare Other | Source: Ambulatory Visit | Attending: Radiation Oncology | Admitting: Radiation Oncology

## 2020-05-01 ENCOUNTER — Encounter: Payer: Self-pay | Admitting: Ophthalmology

## 2020-05-01 DIAGNOSIS — C61 Malignant neoplasm of prostate: Secondary | ICD-10-CM | POA: Diagnosis not present

## 2020-05-01 MED ORDER — TAMSULOSIN HCL 0.4 MG PO CAPS
0.4000 mg | ORAL_CAPSULE | Freq: Two times a day (BID) | ORAL | 3 refills | Status: DC
Start: 2020-05-01 — End: 2020-07-09

## 2020-05-01 NOTE — Telephone Encounter (Signed)
MD office called to report that patient was in office and was hypotensive. Call was after hours. They were requesting advice on treatment options here at the [facility]. A review of chart reveals that he was treated in the ED and discharged.

## 2020-05-02 ENCOUNTER — Encounter: Payer: Self-pay | Admitting: Anesthesiology

## 2020-05-02 ENCOUNTER — Ambulatory Visit
Admission: RE | Admit: 2020-05-02 | Discharge: 2020-05-02 | Disposition: A | Discharge status: Home / Self Care | Payer: Medicare Other | Source: Ambulatory Visit | Attending: Radiation Oncology | Admitting: Radiation Oncology

## 2020-05-02 DIAGNOSIS — C61 Malignant neoplasm of prostate: Secondary | ICD-10-CM | POA: Diagnosis not present

## 2020-05-07 ENCOUNTER — Other Ambulatory Visit: Payer: Medicare Other | Attending: Ophthalmology

## 2020-05-09 ENCOUNTER — Ambulatory Visit: Admission: RE | Admit: 2020-05-09 | Payer: Medicare Other | Source: Home / Self Care | Admitting: Ophthalmology

## 2020-05-09 HISTORY — DX: Presence of dental prosthetic device (complete) (partial): Z97.2

## 2020-05-09 HISTORY — DX: Stiffness of left elbow, not elsewhere classified: M25.622

## 2020-05-09 HISTORY — DX: Dyspnea, unspecified: R06.00

## 2020-05-09 HISTORY — DX: Dorsopathy, unspecified: M53.9

## 2020-05-09 HISTORY — DX: Gastro-esophageal reflux disease without esophagitis: K21.9

## 2020-05-09 HISTORY — DX: Malignant neoplasm of prostate: C61

## 2020-05-09 SURGERY — PHACOEMULSIFICATION, CATARACT, WITH IOL INSERTION
Anesthesia: Topical | Laterality: Right

## 2020-05-29 ENCOUNTER — Telehealth: Payer: Self-pay | Admitting: Urology

## 2020-05-29 ENCOUNTER — Other Ambulatory Visit: Payer: Self-pay | Admitting: Ophthalmology

## 2020-05-29 DIAGNOSIS — N2889 Other specified disorders of kidney and ureter: Secondary | ICD-10-CM

## 2020-05-29 NOTE — Telephone Encounter (Signed)
Notified patient as instructed, patient pleased. Discussed follow-up appointments, patient agrees  

## 2020-05-29 NOTE — Telephone Encounter (Signed)
Please let patient know I have put in an order for a CT scan as follow-up of the previously defined left renal mass.  He will be contacted by radiology to schedule and will call with results.

## 2020-06-03 ENCOUNTER — Ambulatory Visit
Admission: RE | Admit: 2020-06-03 | Discharge: 2020-06-03 | Disposition: A | Discharge status: Home / Self Care | Payer: Medicare Other | Source: Ambulatory Visit | Attending: Radiation Oncology | Admitting: Radiation Oncology

## 2020-06-03 ENCOUNTER — Other Ambulatory Visit: Payer: Self-pay | Admitting: *Deleted

## 2020-06-03 ENCOUNTER — Other Ambulatory Visit: Payer: Self-pay

## 2020-06-03 ENCOUNTER — Encounter: Payer: Self-pay | Admitting: Radiation Oncology

## 2020-06-03 VITALS — BP 94/54 | HR 73 | Temp 97.5°F | Wt 131.0 lb

## 2020-06-03 DIAGNOSIS — C61 Malignant neoplasm of prostate: Secondary | ICD-10-CM

## 2020-06-03 NOTE — Progress Notes (Signed)
Radiation Oncology Follow up Note  Name: Todd HYAMS Sr.   Date:   06/03/2020 MRN:  [id number] DOB: July 28, 1941    This 79 y.o. male presents to the clinic today for 1 month follow-up status post IMRT radiation therapy for stage IIa adenocarcinoma the prostate presenting with a PSA in the 10 range.  REFERRING PROVIDER: Jodi Marble, MD  HPI: Patient is a 79 year old male now at 1 month having completed IMRT radiation therapy for mostly Gleason 7 (3+4) adenocarcinoma the prostate.  He is seen today in routine follow-up is doing fairly well.  He states he is more constipated this time and I have offered him some Senokot or other stool softener.  He specifically denies any increased lower urinary tract symptoms such as urgency or frequency..  He does have a CT scan of the abdomen pelvis scheduled for November 11 through urology.  COMPLICATIONS OF TREATMENT: none  FOLLOW UP COMPLIANCE: keeps appointments   PHYSICAL EXAM:  BP (!) 94/54   Pulse 73   Temp (!) 97.5 F (36.4 C) (Tympanic)   Wt 131 lb (59.4 kg)   BMI 17.77 kg/m  Well-developed well-nourished patient in NAD. HEENT reveals PERLA, EOMI, discs not visualized.  Oral cavity is clear. No oral mucosal lesions are identified. Neck is clear without evidence of cervical or supraclavicular adenopathy. Lungs are clear to A&P. Cardiac examination is essentially unremarkable with regular rate and rhythm without murmur rub or thrill. Abdomen is benign with no organomegaly or masses noted. Motor sensory and DTR levels are equal and symmetric in the upper and lower extremities. Cranial nerves II through XII are grossly intact. Proprioception is intact. No peripheral adenopathy or edema is identified. No motor or sensory levels are noted. Crude visual fields are within normal range.  RADIOLOGY RESULTS: No current films to review  PLAN: , Present time patient is doing well with very low side effect profile secondary to his IMRT radiation  therapy.  He will start some stool softeners.  Of asked to see him back in 3 months with a PSA prior to that visit.  We will review his CT scan of the abdomen and pelvis ordered through urology when that becomes available.  Patient knows to call with any concerns.  I would like to take this opportunity to thank you for allowing me to participate in the care of your patient.Noreene Filbert, MD

## 2020-06-13 ENCOUNTER — Ambulatory Visit
Admission: RE | Admit: 2020-06-13 | Discharge: 2020-06-13 | Disposition: A | Discharge status: Home / Self Care | Payer: Medicare Other | Source: Ambulatory Visit | Attending: Urology | Admitting: Urology

## 2020-06-13 ENCOUNTER — Other Ambulatory Visit: Payer: Self-pay

## 2020-06-13 DIAGNOSIS — N2889 Other specified disorders of kidney and ureter: Secondary | ICD-10-CM | POA: Insufficient documentation

## 2020-06-13 LAB — POCT I-STAT CREATININE: Creatinine, Ser: 0.8 mg/dL (ref 0.61–1.24)

## 2020-06-13 MED ORDER — IOHEXOL 300 MG/ML  SOLN
100.0000 mL | Freq: Once | INTRAMUSCULAR | Status: AC | PRN
  Administered 2020-06-13: 100 mL via INTRAVENOUS

## 2020-06-16 ENCOUNTER — Telehealth: Payer: Self-pay | Admitting: Urology

## 2020-06-16 DIAGNOSIS — N2889 Other specified disorders of kidney and ureter: Secondary | ICD-10-CM

## 2020-06-16 NOTE — Telephone Encounter (Signed)
CT showed the small renal mass is most likely a benign cyst however still too small to adequately characterize by CT.  Would recommend continuing to follow to make sure it does not change in size and would recommend an MRI in 9-12 months.  Order was entered.  Please schedule follow-up visit approximately 1 year.  Will check to see if pacemaker is MR-compatible

## 2020-06-17 NOTE — Telephone Encounter (Signed)
Notified patient as instructed, patient pleased. Discussed follow-up appointments, patient agrees  Talked with patient his pacemaker is called [employer].

## 2020-06-20 ENCOUNTER — Encounter: Payer: Self-pay | Admitting: Ophthalmology

## 2020-06-24 ENCOUNTER — Encounter: Payer: Self-pay | Admitting: Ophthalmology

## 2020-06-24 ENCOUNTER — Other Ambulatory Visit: Payer: Self-pay

## 2020-06-24 NOTE — Anesthesia Preprocedure Evaluation (Addendum)
Anesthesia Evaluation  Patient identified by MRN, date of birth, ID band Patient awake    Reviewed: Allergy & Precautions, H&P , NPO status , Patient's Chart, lab work & pertinent test results  Airway Mallampati: II  TM Distance: >3 FB Neck ROM: full    Dental no notable dental hx. (+) Edentulous Upper, Edentulous Lower   Pulmonary COPD, former smoker,    Pulmonary exam normal breath sounds clear to auscultation       Cardiovascular hypertension, + angina + Past MI  Normal cardiovascular exam+ pacemaker  Rhythm:regular Rate:Normal  In setting of heart block- now pacemaker placed.  1. Left ventricular ejection fraction, by visual estimation, is 45 to  50%. The left ventricle has mild to moderately decreased function. There  is mildly increased left ventricular hypertrophy.  2. Left ventricular diastolic parameters are consistent with Grade I  diastolic dysfunction (impaired relaxation).  3. The left ventricle demonstrates global hypokinesis.  4. Global right ventricle has normal systolic function.The right  ventricular size is normal. No increase in right ventricular wall  thickness.  5. Left atrial size was normal.  6. Right atrial size was normal.  7. The mitral valve is normal in structure. No evidence of mitral valve  regurgitation. No evidence of mitral stenosis.  8. The tricuspid valve is normal in structure.  9. The aortic valve is normal in structure. Aortic valve regurgitation is  not visualized. No evidence of aortic valve sclerosis or stenosis.  10. The pulmonic valve was normal in structure. Pulmonic valve  regurgitation is not visualized.  11. Moderately elevated pulmonary artery systolic pressure.  12. The inferior vena cava is normal in size with greater than 50%  respiratory variability, suggesting right atrial pressure of 3 mmHg.    Neuro/Psych    GI/Hepatic GERD  ,  Endo/Other    Renal/GU Renal  disease     Musculoskeletal   Abdominal   Peds  Hematology   Anesthesia Other Findings   Reproductive/Obstetrics                            Anesthesia Physical Anesthesia Plan  ASA: III  Anesthesia Plan: MAC   Post-op Pain Management:    Induction:   PONV Risk Score and Plan: 1 and Treatment may vary due to age or medical condition, Midazolam and TIVA  Airway Management Planned:   Additional Equipment:   Intra-op Plan:   Post-operative Plan:   Informed Consent: I have reviewed the patients History and Physical, chart, labs and discussed the procedure including the risks, benefits and alternatives for the proposed anesthesia with the patient or authorized representative who has indicated his/her understanding and acceptance.     Dental Advisory Given  Plan Discussed with: CRNA  Anesthesia Plan Comments:         Anesthesia Quick Evaluation

## 2020-06-25 NOTE — Discharge Instructions (Signed)

## 2020-06-28 ENCOUNTER — Other Ambulatory Visit: Payer: Self-pay

## 2020-06-28 ENCOUNTER — Other Ambulatory Visit
Admission: RE | Admit: 2020-06-28 | Discharge: 2020-06-28 | Disposition: A | Discharge status: Home / Self Care | Payer: Medicare Other | Source: Ambulatory Visit | Attending: Ophthalmology | Admitting: Ophthalmology

## 2020-06-28 DIAGNOSIS — Z20822 Contact with and (suspected) exposure to covid-19: Secondary | ICD-10-CM | POA: Diagnosis not present

## 2020-06-28 DIAGNOSIS — Z01812 Encounter for preprocedural laboratory examination: Secondary | ICD-10-CM | POA: Diagnosis present

## 2020-06-29 LAB — SARS CORONAVIRUS 2 (TAT 6-24 HRS): SARS Coronavirus 2: NEGATIVE

## 2020-07-04 ENCOUNTER — Other Ambulatory Visit: Payer: Self-pay

## 2020-07-04 ENCOUNTER — Other Ambulatory Visit
Admission: RE | Admit: 2020-07-04 | Discharge: 2020-07-04 | Disposition: A | Discharge status: Home / Self Care | Payer: Medicare Other | Source: Ambulatory Visit | Attending: Internal Medicine | Admitting: Internal Medicine

## 2020-07-04 DIAGNOSIS — Z01812 Encounter for preprocedural laboratory examination: Secondary | ICD-10-CM | POA: Diagnosis present

## 2020-07-04 DIAGNOSIS — Z20822 Contact with and (suspected) exposure to covid-19: Secondary | ICD-10-CM | POA: Diagnosis not present

## 2020-07-04 LAB — SARS CORONAVIRUS 2 (TAT 6-24 HRS): SARS Coronavirus 2: NEGATIVE

## 2020-07-05 ENCOUNTER — Encounter: Payer: Self-pay | Admitting: Internal Medicine

## 2020-07-07 LAB — SARS CORONAVIRUS 2 (TAT 6-24 HRS): SARS Coronavirus 2: NEGATIVE

## 2020-07-08 ENCOUNTER — Other Ambulatory Visit: Payer: Self-pay

## 2020-07-08 ENCOUNTER — Ambulatory Visit: Payer: Medicare Other | Admitting: Anesthesiology

## 2020-07-08 ENCOUNTER — Encounter: Payer: Self-pay | Admitting: Internal Medicine

## 2020-07-08 ENCOUNTER — Ambulatory Visit
Admission: RE | Admit: 2020-07-08 | Discharge: 2020-07-08 | Disposition: A | Discharge status: Home / Self Care | Payer: Medicare Other | Attending: Internal Medicine | Admitting: Internal Medicine

## 2020-07-08 ENCOUNTER — Encounter
Admission: RE | Disposition: A | Discharge status: Home / Self Care | Payer: Self-pay | Source: Home / Self Care | Attending: Internal Medicine

## 2020-07-08 DIAGNOSIS — K5520 Angiodysplasia of colon without hemorrhage: Secondary | ICD-10-CM | POA: Diagnosis not present

## 2020-07-08 DIAGNOSIS — Z8719 Personal history of other diseases of the digestive system: Secondary | ICD-10-CM | POA: Diagnosis not present

## 2020-07-08 DIAGNOSIS — Z883 Allergy status to other anti-infective agents status: Secondary | ICD-10-CM | POA: Insufficient documentation

## 2020-07-08 DIAGNOSIS — Z791 Long term (current) use of non-steroidal anti-inflammatories (NSAID): Secondary | ICD-10-CM | POA: Insufficient documentation

## 2020-07-08 DIAGNOSIS — K5730 Diverticulosis of large intestine without perforation or abscess without bleeding: Secondary | ICD-10-CM | POA: Insufficient documentation

## 2020-07-08 DIAGNOSIS — R195 Other fecal abnormalities: Secondary | ICD-10-CM | POA: Insufficient documentation

## 2020-07-08 DIAGNOSIS — K640 First degree hemorrhoids: Secondary | ICD-10-CM | POA: Insufficient documentation

## 2020-07-08 DIAGNOSIS — N400 Enlarged prostate without lower urinary tract symptoms: Secondary | ICD-10-CM | POA: Diagnosis not present

## 2020-07-08 DIAGNOSIS — D127 Benign neoplasm of rectosigmoid junction: Secondary | ICD-10-CM | POA: Insufficient documentation

## 2020-07-08 HISTORY — PX: COLONOSCOPY WITH PROPOFOL: SHX5780

## 2020-07-08 HISTORY — DX: Personal history of urinary calculi: Z87.442

## 2020-07-08 SURGERY — COLONOSCOPY WITH PROPOFOL
Anesthesia: General

## 2020-07-08 MED ORDER — PROPOFOL 500 MG/50ML IV EMUL
INTRAVENOUS | Status: DC | PRN
  Administered 2020-07-08: 100 ug/kg/min via INTRAVENOUS

## 2020-07-08 MED ORDER — LIDOCAINE HCL (CARDIAC) PF 100 MG/5ML IV SOSY
PREFILLED_SYRINGE | INTRAVENOUS | Status: DC | PRN
  Administered 2020-07-08: 50 mg via INTRAVENOUS

## 2020-07-08 MED ORDER — PROPOFOL 500 MG/50ML IV EMUL
INTRAVENOUS | Status: AC
  Filled 2020-07-08: qty 50

## 2020-07-08 MED ORDER — PROPOFOL 10 MG/ML IV BOLUS
INTRAVENOUS | Status: DC | PRN
  Administered 2020-07-08: 60 mg via INTRAVENOUS

## 2020-07-08 MED ORDER — SODIUM CHLORIDE 0.9 % IV SOLN: INTRAVENOUS | Status: DC

## 2020-07-08 MED ORDER — LIDOCAINE HCL (PF) 2 % IJ SOLN
INTRAMUSCULAR | Status: AC
  Filled 2020-07-08: qty 5

## 2020-07-08 NOTE — Interval H&P Note (Signed)
History and Physical Interval Note:  07/08/2020 11:50 AM  Todd Kocher Sr.  has presented today for surgery, with the diagnosis of personal history of colonic polyps.  The various methods of treatment have been discussed with the patient and family. After consideration of risks, benefits and other options for treatment, the patient has consented to  Procedure(s): COLONOSCOPY WITH PROPOFOL (N/A) as a surgical intervention.  The patient's history has been reviewed, patient examined, no change in status, stable for surgery.  I have reviewed the patient's chart and labs.  Questions were answered to the patient's satisfaction.     [place], [place]

## 2020-07-08 NOTE — Op Note (Signed)
[facility] Gastroenterology Patient Name: Todd Webster Procedure Date: 07/08/2020 11:52 AM MRN: [id number] Account #: [id number] Date of Birth: June 08, 1941 Admit Type: Outpatient Age: 79 Room: [facility] ENDO ROOM 2 Gender: Male Note Status: Finalized Procedure:             Colonoscopy Indications:           Heme positive stool Providers:             Benay Pike. Demmi Sindt MD, MD Medicines:             Propofol per Anesthesia Complications:         No immediate complications. Procedure:             Pre-Anesthesia Assessment:                        - The risks and benefits of the procedure and the                         sedation options and risks were discussed with the                         patient. All questions were answered and informed                         consent was obtained.                        - Patient identification and proposed procedure were                         verified prior to the procedure by the nurse. The                         procedure was verified in the procedure room.                        - ASA Grade Assessment: III - A patient with severe                         systemic disease.                        - After reviewing the risks and benefits, the patient                         was deemed in satisfactory condition to undergo the                         procedure.                        After obtaining informed consent, the colonoscope was                         passed under direct vision. Throughout the procedure,                         the patient's blood pressure, pulse, and oxygen  saturations were monitored continuously. The                         Colonoscope was introduced through the anus and                         advanced to the the cecum, identified by appendiceal                         orifice and ileocecal valve. The colonoscopy was                         performed without difficulty. The patient  tolerated                         the procedure well. The quality of the bowel                         preparation was good. The ileocecal valve, appendiceal                         orifice, and rectum were photographed. Findings:      Non-bleeding internal hemorrhoids were found during retroflexion. The       hemorrhoids were Grade I (internal hemorrhoids that do not prolapse).      A 9 mm polyp was found in the recto-sigmoid colon. The polyp was       semi-pedunculated. The polyp was removed with a hot snare. Resection and       retrieval were complete.      A 7 mm polyp was found in the ascending colon. The polyp was sessile.       The polyp was removed with a cold snare. Resection and retrieval were       complete. To prevent bleeding after the polypectomy, one hemostatic clip       was successfully placed (MR conditional). There was no bleeding during,       or at the end, of the procedure.      A few small-mouthed diverticula were found in the sigmoid colon.      A single medium-sized localized angioectasia without bleeding was found       in the cecum.      The perianal examination was normal.      The digital rectal exam findings include enlarged prostate. Pertinent       negatives include no anal lesion or abnormality.      The exam was otherwise without abnormality. Impression:            - Non-bleeding internal hemorrhoids.                        - One 9 mm polyp at the recto-sigmoid colon, removed                         with a hot snare. Resected and retrieved.                        - One 7 mm polyp in the ascending colon, removed with  a cold snare. Resected and retrieved. Clip (MR                         conditional) was placed.                        - Diverticulosis in the sigmoid colon.                        - A single non-bleeding colonic angioectasia.                        - Enlarged prostate found on digital rectal exam.                         - The examination was otherwise normal. Recommendation:        - Patient has a contact number available for                         emergencies. The signs and symptoms of potential                         delayed complications were discussed with the patient.                         Return to normal activities tomorrow. Written                         discharge instructions were provided to the patient.                        - Resume previous diet.                        - Continue present medications.                        - Await pathology results.                        - Repeat colonoscopy is recommended for surveillance.                         The colonoscopy date will be determined after                         pathology results from today's exam become available                         for review.                        - Return to GI office PRN.                        - The findings and recommendations were discussed with                         the patient. Procedure Code(s):     --- Professional ---  45385, Colonoscopy, flexible; with removal of                         tumor(s), polyp(s), or other lesion(s) by snare                         technique Diagnosis Code(s):     --- Professional ---                        N40.0, Benign prostatic hyperplasia without lower                         urinary tract symptoms                        K57.30, Diverticulosis of large intestine without                         perforation or abscess without bleeding                        R19.5, Other fecal abnormalities                        K55.20, Angiodysplasia of colon without hemorrhage                        K64.0, First degree hemorrhoids                        K63.5, Polyp of colon CPT copyright 2019 American Medical Association. All rights reserved. The codes documented in this report are preliminary and upon coder review may  be revised to meet current  compliance requirements. Efrain Sella MD, MD 07/08/2020 12:28:26 PM This report has been signed electronically. Number of Addenda: 0 Note Initiated On: 07/08/2020 11:52 AM Scope Withdrawal Time: 0 hours 5 minutes 56 seconds  Total Procedure Duration: 0 hours 12 minutes 58 seconds  Estimated Blood Loss:  Estimated blood loss: none. Estimated blood loss:                         none. Estimated blood loss: none.      [facility]

## 2020-07-08 NOTE — Transfer of Care (Signed)
Immediate Anesthesia Transfer of Care Note  Patient: Todd SOBOTKA Sr.  Procedure(s) Performed: COLONOSCOPY WITH PROPOFOL (N/A )  Patient Location: PACU  Anesthesia Type:MAC  Level of Consciousness: awake and drowsy  Airway & Oxygen Therapy: Patient Spontanous Breathing  Post-op Assessment: Report given to RN and Post -op Vital signs reviewed and stable  Post vital signs: stable  Last Vitals:  Vitals Value Taken Time  BP    Temp    Pulse    Resp    SpO2      Last Pain:  Vitals:   07/08/20 1116  TempSrc: Temporal  PainSc: 0-No pain         Complications: No complications documented.
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
## 2020-07-09 ENCOUNTER — Encounter: Payer: Self-pay | Admitting: Internal Medicine

## 2020-07-09 ENCOUNTER — Other Ambulatory Visit: Payer: Self-pay | Admitting: Radiation Oncology

## 2020-07-09 LAB — SURGICAL PATHOLOGY

## 2020-07-09 NOTE — Anesthesia Postprocedure Evaluation (Signed)
Anesthesia Post Note  Patient: Todd MATHEY Sr.  Procedure(s) Performed: COLONOSCOPY WITH PROPOFOL (N/A )  Patient location during evaluation: Endoscopy Anesthesia Type: General Level of consciousness: awake and alert and oriented Pain management: pain level controlled Vital Signs Assessment: post-procedure vital signs reviewed and stable Respiratory status: spontaneous breathing Cardiovascular status: blood pressure returned to baseline Anesthetic complications: no   No complications documented.   Last Vitals:  Vitals:   07/08/20 1244 07/08/20 1254  BP: 122/63 (!) 145/66  Pulse: 72   Resp: 19 13  Temp:    SpO2: 100% 100%    Last Pain:  Vitals:   07/09/20 0734  TempSrc:   PainSc: 0-No pain                 Georgian Mcclory

## 2020-07-14 ENCOUNTER — Encounter: Payer: Self-pay | Admitting: Emergency Medicine

## 2020-07-14 ENCOUNTER — Emergency Department
Admission: EM | Admit: 2020-07-14 | Discharge: 2020-07-14 | Disposition: A | Discharge status: Home / Self Care | Payer: Medicare Other | Attending: Emergency Medicine | Admitting: Emergency Medicine

## 2020-07-14 ENCOUNTER — Other Ambulatory Visit: Payer: Self-pay

## 2020-07-14 ENCOUNTER — Emergency Department: Payer: Medicare Other

## 2020-07-14 DIAGNOSIS — I129 Hypertensive chronic kidney disease with stage 1 through stage 4 chronic kidney disease, or unspecified chronic kidney disease: Secondary | ICD-10-CM | POA: Insufficient documentation

## 2020-07-14 DIAGNOSIS — J441 Chronic obstructive pulmonary disease with (acute) exacerbation: Secondary | ICD-10-CM | POA: Diagnosis not present

## 2020-07-14 DIAGNOSIS — Z8546 Personal history of malignant neoplasm of prostate: Secondary | ICD-10-CM | POA: Insufficient documentation

## 2020-07-14 DIAGNOSIS — R42 Dizziness and giddiness: Secondary | ICD-10-CM | POA: Diagnosis present

## 2020-07-14 DIAGNOSIS — N189 Chronic kidney disease, unspecified: Secondary | ICD-10-CM | POA: Diagnosis not present

## 2020-07-14 DIAGNOSIS — E860 Dehydration: Secondary | ICD-10-CM | POA: Diagnosis not present

## 2020-07-14 DIAGNOSIS — Z87891 Personal history of nicotine dependence: Secondary | ICD-10-CM | POA: Insufficient documentation

## 2020-07-14 DIAGNOSIS — R0602 Shortness of breath: Secondary | ICD-10-CM | POA: Diagnosis not present

## 2020-07-14 DIAGNOSIS — Z950 Presence of cardiac pacemaker: Secondary | ICD-10-CM | POA: Diagnosis not present

## 2020-07-14 LAB — BASIC METABOLIC PANEL
Anion gap: 11 (ref 5–15)
BUN: 15 mg/dL (ref 8–23)
CO2: 26 mmol/L (ref 22–32)
Calcium: 9.2 mg/dL (ref 8.9–10.3)
Chloride: 98 mmol/L (ref 98–111)
Creatinine, Ser: 0.98 mg/dL (ref 0.61–1.24)
GFR, Estimated: >60 mL/min (ref >60)
Glucose, Bld: 99 mg/dL (ref 70–99)
Potassium: 4 mmol/L (ref 3.5–5.1)
Sodium: 135 mmol/L (ref 135–145)

## 2020-07-14 LAB — CBC
HCT: 33.8 % — ABNORMAL LOW (ref 39.0–52.0)
Hemoglobin: 11.8 g/dL — ABNORMAL LOW (ref 13.0–17.0)
MCH: 33.1 pg (ref 26.0–34.0)
MCHC: 34.9 g/dL (ref 30.0–36.0)
MCV: 94.9 fL (ref 80.0–100.0)
Platelets: 273 10*3/uL (ref 150–400)
RBC: 3.56 MIL/uL — ABNORMAL LOW (ref 4.22–5.81)
RDW: 13 % (ref 11.5–15.5)
WBC: 6.5 10*3/uL (ref 4.0–10.5)
nRBC: 0 % (ref 0.0–0.2)

## 2020-07-14 MED ORDER — SODIUM CHLORIDE 0.9 % IV SOLN
1000.0000 mL | Freq: Once | INTRAVENOUS | Status: AC
  Administered 2020-07-14: 15:00:00 1000 mL via INTRAVENOUS

## 2020-07-14 NOTE — ED Triage Notes (Signed)
Pt to ED via POV stating that he has had dizziness and shortness of breath since he woke up this morning. Pt is able to speak in complete sentences at this time. Pt states that he used his inhalers at home this morning and it helped some. Pt states that when he stood up to go to the rest room his "head went round and round" and he had to sit back down on the bed. Pt is in NAD.
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
## 2020-08-28 ENCOUNTER — Inpatient Hospital Stay: Payer: Medicare Other | Attending: Radiation Oncology

## 2020-08-28 DIAGNOSIS — C61 Malignant neoplasm of prostate: Secondary | ICD-10-CM | POA: Insufficient documentation

## 2020-08-28 LAB — PSA: Prostatic Specific Antigen: 0.02 ng/mL (ref 0.00–4.00)

## 2020-09-03 ENCOUNTER — Other Ambulatory Visit: Payer: Self-pay

## 2020-09-03 ENCOUNTER — Encounter: Payer: Self-pay | Admitting: Ophthalmology

## 2020-09-04 ENCOUNTER — Encounter: Payer: Self-pay | Admitting: Radiation Oncology

## 2020-09-04 ENCOUNTER — Ambulatory Visit
Admission: RE | Admit: 2020-09-04 | Discharge: 2020-09-04 | Disposition: A | Discharge status: Home / Self Care | Payer: Medicare Other | Source: Ambulatory Visit | Attending: Radiation Oncology | Admitting: Radiation Oncology

## 2020-09-04 VITALS — BP 104/51 | HR 69 | Temp 96.7°F | Wt 140.9 lb

## 2020-09-04 DIAGNOSIS — C61 Malignant neoplasm of prostate: Secondary | ICD-10-CM

## 2020-09-04 NOTE — Progress Notes (Signed)
Radiation Oncology Follow up Note  Name: Todd Webster.   Date:   09/04/2020 MRN:  [id number] DOB: 02/18/1941    This 80 y.o. male presents to the clinic today for 81-month follow-up status post IMRT radiation therapy for stage IIa adenocarcinoma the prostate presenting with a PSA in the 10 range.  REFERRING PROVIDER: Jodi Marble, MD  HPI: Patient is a 80 year old male now out 4 months having completed IMRT radiation therapy for Gleason 7 mostly (3+4) adenocarcinoma.  He is seen today in routine follow-up is doing well specifically denies any increased lower urinary tract symptoms diarrhea or fatigue..  Patient has small lesions of the inferior pole of the left kidney unchanged from prior examination which is being followed with CT scans which I have reviewed.  His most recent PSA is 3.84  COMPLICATIONS OF TREATMENT: none  FOLLOW UP COMPLIANCE: keeps appointments   PHYSICAL EXAM:  BP (!) 104/51   Pulse 69   Temp (!) 96.7 F (35.9 C) (Tympanic)   Wt 140 lb 14.4 oz (63.9 kg)   BMI 19.11 kg/m  Well-developed well-nourished patient in NAD. HEENT reveals PERLA, EOMI, discs not visualized.  Oral cavity is clear. No oral mucosal lesions are identified. Neck is clear without evidence of cervical or supraclavicular adenopathy. Lungs are clear to A&P. Cardiac examination is essentially unremarkable with regular rate and rhythm without murmur rub or thrill. Abdomen is benign with no organomegaly or masses noted. Motor sensory and DTR levels are equal and symmetric in the upper and lower extremities. Cranial nerves II through XII are grossly intact. Proprioception is intact. No peripheral adenopathy or edema is identified. No motor or sensory levels are noted. Crude visual fields are within normal range.  RADIOLOGY RESULTS: CT scan reviewed compatible with above-stated findings  PLAN: Present time patient is under excellent biochemical control of his prostate cancer with very low side effect  profile.  I am pleased with his overall progress.  I have asked to see him back in 6 months with a PSA at that time.  Patient continues close follow-up care with urology.  Patient knows to call with any concerns.  I would like to take this opportunity to thank you for allowing me to participate in the care of your patient.Noreene Filbert, MD

## 2020-09-05 ENCOUNTER — Other Ambulatory Visit
Admission: RE | Admit: 2020-09-05 | Discharge: 2020-09-05 | Disposition: A | Discharge status: Home / Self Care | Payer: Medicare Other | Source: Ambulatory Visit | Attending: Ophthalmology | Admitting: Ophthalmology

## 2020-09-05 ENCOUNTER — Other Ambulatory Visit: Payer: Self-pay

## 2020-09-05 DIAGNOSIS — Z01812 Encounter for preprocedural laboratory examination: Secondary | ICD-10-CM | POA: Insufficient documentation

## 2020-09-05 DIAGNOSIS — Z20822 Contact with and (suspected) exposure to covid-19: Secondary | ICD-10-CM | POA: Diagnosis not present

## 2020-09-06 LAB — SARS CORONAVIRUS 2 (TAT 6-24 HRS): SARS Coronavirus 2: NEGATIVE

## 2020-09-09 ENCOUNTER — Other Ambulatory Visit: Payer: Self-pay

## 2020-09-09 ENCOUNTER — Ambulatory Visit: Payer: Medicare Other | Admitting: Anesthesiology

## 2020-09-09 ENCOUNTER — Encounter: Payer: Self-pay | Admitting: Ophthalmology

## 2020-09-09 ENCOUNTER — Ambulatory Visit
Admission: RE | Admit: 2020-09-09 | Discharge: 2020-09-09 | Disposition: A | Discharge status: Home / Self Care | Payer: Medicare Other | Attending: Ophthalmology | Admitting: Ophthalmology

## 2020-09-09 ENCOUNTER — Encounter
Admission: RE | Disposition: A | Discharge status: Home / Self Care | Payer: Self-pay | Source: Home / Self Care | Attending: Ophthalmology

## 2020-09-09 DIAGNOSIS — Z79899 Other long term (current) drug therapy: Secondary | ICD-10-CM | POA: Insufficient documentation

## 2020-09-09 DIAGNOSIS — H2181 Floppy iris syndrome: Secondary | ICD-10-CM | POA: Insufficient documentation

## 2020-09-09 DIAGNOSIS — Z950 Presence of cardiac pacemaker: Secondary | ICD-10-CM | POA: Insufficient documentation

## 2020-09-09 DIAGNOSIS — H2512 Age-related nuclear cataract, left eye: Secondary | ICD-10-CM | POA: Insufficient documentation

## 2020-09-09 DIAGNOSIS — Z8546 Personal history of malignant neoplasm of prostate: Secondary | ICD-10-CM | POA: Diagnosis not present

## 2020-09-09 DIAGNOSIS — Z87891 Personal history of nicotine dependence: Secondary | ICD-10-CM | POA: Insufficient documentation

## 2020-09-09 HISTORY — DX: Chronic kidney disease, unspecified: N18.9

## 2020-09-09 HISTORY — DX: Angina pectoris, unspecified: I20.9

## 2020-09-09 HISTORY — DX: Unspecified hearing loss, unspecified ear: H91.90

## 2020-09-09 HISTORY — DX: Pneumonia, unspecified organism: J18.9

## 2020-09-09 HISTORY — DX: Other specified disorders of bone, unspecified site: M89.8X9

## 2020-09-09 HISTORY — DX: Essential (primary) hypertension: I10

## 2020-09-09 HISTORY — PX: CATARACT EXTRACTION W/PHACO: SHX586

## 2020-09-09 HISTORY — DX: Unspecified disorder of nose and nasal sinuses: J34.9

## 2020-09-09 SURGERY — PHACOEMULSIFICATION, CATARACT, WITH IOL INSERTION
Anesthesia: Monitor Anesthesia Care | Site: Eye | Laterality: Left

## 2020-09-09 MED ORDER — EPINEPHRINE PF 1 MG/ML IJ SOLN
INTRAOCULAR | Status: DC | PRN
  Administered 2020-09-09: 98 mL via OPHTHALMIC

## 2020-09-09 MED ORDER — ONDANSETRON HCL 4 MG/2ML IJ SOLN: 4.0000 mg | Freq: Once | INTRAMUSCULAR | Status: DC | PRN

## 2020-09-09 MED ORDER — ACETAMINOPHEN 325 MG PO TABS: 325.0000 mg | ORAL_TABLET | ORAL | Status: DC | PRN

## 2020-09-09 MED ORDER — MOXIFLOXACIN HCL 0.5 % OP SOLN
OPHTHALMIC | Status: DC | PRN
  Administered 2020-09-09: 0.2 mL via OPHTHALMIC

## 2020-09-09 MED ORDER — ARMC OPHTHALMIC DILATING DROPS
1.0000 "application " | OPHTHALMIC | Status: DC | PRN
  Administered 2020-09-09 (×3): 1 via OPHTHALMIC

## 2020-09-09 MED ORDER — FENTANYL CITRATE (PF) 100 MCG/2ML IJ SOLN
INTRAMUSCULAR | Status: DC | PRN
  Administered 2020-09-09: 50 ug via INTRAVENOUS

## 2020-09-09 MED ORDER — SODIUM HYALURONATE 23 MG/ML IO SOLN
INTRAOCULAR | Status: DC | PRN
  Administered 2020-09-09: 0.6 mL via INTRAOCULAR

## 2020-09-09 MED ORDER — LIDOCAINE HCL (PF) 2 % IJ SOLN
INTRAOCULAR | Status: DC | PRN
  Administered 2020-09-09: 1 mL via INTRAOCULAR

## 2020-09-09 MED ORDER — SODIUM HYALURONATE 10 MG/ML IO SOLN
INTRAOCULAR | Status: DC | PRN
  Administered 2020-09-09: 0.55 mL via INTRAOCULAR

## 2020-09-09 MED ORDER — TETRACAINE HCL 0.5 % OP SOLN
1.0000 [drp] | OPHTHALMIC | Status: DC | PRN
  Administered 2020-09-09 (×3): 1 [drp] via OPHTHALMIC

## 2020-09-09 MED ORDER — ACETAMINOPHEN 160 MG/5ML PO SOLN: 325.0000 mg | ORAL | Status: DC | PRN

## 2020-09-09 SURGICAL SUPPLY — 20 items
CANNULA ANT/CHMB 27G (MISCELLANEOUS) ×2 IMPLANT
CANNULA ANT/CHMB 27GA (MISCELLANEOUS) ×4 IMPLANT
DISSECTOR HYDRO NUCLEUS 50X22 (MISCELLANEOUS) ×2 IMPLANT
GLOVE SURG LX 7.5 STRW (GLOVE) ×1
GLOVE SURG LX STRL 7.5 STRW (GLOVE) ×1 IMPLANT
GLOVE SURG SYN 8.5  E (GLOVE) ×2
GLOVE SURG SYN 8.5 E (GLOVE) ×1 IMPLANT
GLOVE SURG SYN 8.5 PF PI (GLOVE) ×1 IMPLANT
GOWN STRL REUS W/ TWL LRG LVL3 (GOWN DISPOSABLE) ×2 IMPLANT
GOWN STRL REUS W/TWL LRG LVL3 (GOWN DISPOSABLE) ×4
LENS IOL TECNIS EYHANCE 16.5 (Intraocular Lens) ×1 IMPLANT
MARKER SKIN DUAL TIP RULER LAB (MISCELLANEOUS) ×2 IMPLANT
PACK DR. KING ARMS (PACKS) ×2 IMPLANT
PACK EYE AFTER SURG (MISCELLANEOUS) ×2 IMPLANT
PACK OPTHALMIC (MISCELLANEOUS) ×2 IMPLANT
RING MALYGIN (MISCELLANEOUS) ×1 IMPLANT
SYR 3ML LL SCALE MARK (SYRINGE) ×2 IMPLANT
SYR TB 1ML LUER SLIP (SYRINGE) ×2 IMPLANT
WATER STERILE IRR 250ML POUR (IV SOLUTION) ×2 IMPLANT
WIPE NON LINTING 3.25X3.25 (MISCELLANEOUS) ×2 IMPLANT

## 2020-09-09 NOTE — Progress Notes (Signed)
No risk

## 2020-09-09 NOTE — Op Note (Signed)
OPERATIVE NOTE  Chin Wachter [id number] 09/09/2020   PREOPERATIVE DIAGNOSIS:  Nuclear sclerotic cataract left eye.  H25.12   POSTOPERATIVE DIAGNOSIS:     1.  Nuclear sclerotic cataract left eye.   2.  Intraoperative floppy iris syndrome.   PROCEDURE:  CPT [phone] Complex phacoemusification with posterior chamber intraocular lens placement of the left eye, requiring use of malyugin ring for iris dilation and stabilization.  LENS:   Implant Name Type Inv. Item Serial No. Manufacturer Lot No. LRB No. Used Action  LENS IOL TECNIS EYHANCE 16.5 - [id number] Intraocular Lens LENS IOL TECNIS EYHANCE 16.5 [id number] JOHNSON   Left 1 Implanted      Procedure(s) with comments: CATARACT EXTRACTION PHACO AND INTRAOCULAR LENS PLACEMENT (IOC) LEFT 11.48 01:12.1 (Left) - would appreciate latest possible surgery time  DIB00 +16.5   ULTRASOUND TIME: 1 minutes 12 seconds.  CDE 11.48   SURGEON:  Benay Pillow, MD, MPH   ANESTHESIA:  Topical with tetracaine drops augmented with 1% preservative-free intracameral lidocaine.  ESTIMATED BLOOD LOSS: <1 mL   COMPLICATIONS:  None.   DESCRIPTION OF PROCEDURE:  The patient was identified in the holding room and transported to the operating room and placed in the supine position under the operating microscope.  The left eye was identified as the operative eye and it was prepped and draped in the usual sterile ophthalmic fashion.   A 1.0 millimeter clear-corneal paracentesis was made at the 5:00 position. 0.5 ml of preservative-free 1% lidocaine with epinephrine was injected into the anterior chamber.  The anterior chamber was filled with Healon 5 viscoelastic.    The pupil was small.  There was pseudoexfoliative material on the lens capsule.  A malyugin ring was placed without difficulty.  A 2.4 millimeter keratome was used to make a near-clear corneal incision at the 2:00 position.  A curvilinear capsulorrhexis was made with a cystotome and capsulorrhexis  forceps.  Balanced salt solution was used to hydrodissect and hydrodelineate the nucleus.   Phacoemulsification was then used in stop and chop fashion to remove the lens nucleus and epinucleus.  The remaining cortex was then removed using the irrigation and aspiration handpiece. Healon was then placed into the capsular bag to distend it for lens placement.  A lens was then injected into the capsular bag.    The malyugin ring was removed without difficulty.   The remaining viscoelastic was aspirated.   Wounds were hydrated with balanced salt solution.  The anterior chamber was inflated to a physiologic pressure with balanced salt solution.  Intracameral vigamox 0.1 mL undiltued was injected into the eye and a drop placed onto the ocular surface.  No wound leaks were noted.  The patient was taken to the recovery room in stable condition without complications of anesthesia or surgery  Benay Pillow 09/09/2020, 1:04 PM

## 2020-09-09 NOTE — Anesthesia Postprocedure Evaluation (Signed)
Anesthesia Post Note  Patient: Todd SCHAAB Sr.  Procedure(s) Performed: CATARACT EXTRACTION PHACO AND INTRAOCULAR LENS PLACEMENT (IOC) LEFT 11.48 01:12.1 (Left Eye)     Patient location during evaluation: PACU Anesthesia Type: MAC Level of consciousness: awake and alert and oriented Pain management: satisfactory to patient Vital Signs Assessment: post-procedure vital signs reviewed and stable Respiratory status: spontaneous breathing, nonlabored ventilation and respiratory function stable Cardiovascular status: blood pressure returned to baseline and stable Postop Assessment: Adequate PO intake and No signs of nausea or vomiting Anesthetic complications: no   No complications documented.  Raliegh Ip
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
## 2020-09-09 NOTE — Transfer of Care (Signed)
Immediate Anesthesia Transfer of Care Note  Patient: Todd CHAPPUIS Sr.  Procedure(s) Performed: CATARACT EXTRACTION PHACO AND INTRAOCULAR LENS PLACEMENT (IOC) LEFT 11.48 01:12.1 (Left Eye)  Patient Location: PACU  Anesthesia Type: MAC  Level of Consciousness: awake, alert  and patient cooperative  Airway and Oxygen Therapy: Patient Spontanous Breathing and Patient connected to supplemental oxygen  Post-op Assessment: Post-op Vital signs reviewed, Patient's Cardiovascular Status Stable, Respiratory Function Stable, Patent Airway and No signs of Nausea or vomiting  Post-op Vital Signs: Reviewed and stable  Complications: No complications documented.

## 2020-09-09 NOTE — Anesthesia Procedure Notes (Signed)
Procedure Name: MAC Date/Time: 09/09/2020 12:38 PM Performed by: Silvana Newness, CRNA Pre-anesthesia Checklist: Patient identified, Emergency Drugs available, Suction available, Patient being monitored and Timeout performed Patient Re-evaluated:Patient Re-evaluated prior to induction Oxygen Delivery Method: Nasal cannula Placement Confirmation: positive ETCO2

## 2020-09-10 ENCOUNTER — Encounter: Payer: Self-pay | Admitting: Ophthalmology

## 2020-09-12 ENCOUNTER — Other Ambulatory Visit: Payer: Self-pay | Admitting: Radiation Oncology

## 2020-10-03 ENCOUNTER — Other Ambulatory Visit: Payer: Self-pay

## 2020-10-03 ENCOUNTER — Other Ambulatory Visit
Admission: RE | Admit: 2020-10-03 | Discharge: 2020-10-03 | Disposition: A | Discharge status: Home / Self Care | Payer: Medicare Other | Source: Ambulatory Visit | Attending: Ophthalmology | Admitting: Ophthalmology

## 2020-10-03 DIAGNOSIS — Z20822 Contact with and (suspected) exposure to covid-19: Secondary | ICD-10-CM | POA: Insufficient documentation

## 2020-10-03 DIAGNOSIS — Z01812 Encounter for preprocedural laboratory examination: Secondary | ICD-10-CM | POA: Diagnosis present

## 2020-10-03 LAB — SARS CORONAVIRUS 2 (TAT 6-24 HRS): SARS Coronavirus 2: NEGATIVE

## 2020-10-03 NOTE — Discharge Instructions (Signed)

## 2020-10-07 ENCOUNTER — Ambulatory Visit: Payer: Medicare Other | Admitting: Anesthesiology

## 2020-10-07 ENCOUNTER — Encounter: Payer: Self-pay | Admitting: Ophthalmology

## 2020-10-07 ENCOUNTER — Other Ambulatory Visit: Payer: Self-pay

## 2020-10-07 ENCOUNTER — Ambulatory Visit
Admission: RE | Admit: 2020-10-07 | Discharge: 2020-10-07 | Disposition: A | Discharge status: Home / Self Care | Payer: Medicare Other | Attending: Ophthalmology | Admitting: Ophthalmology

## 2020-10-07 ENCOUNTER — Encounter
Admission: RE | Disposition: A | Discharge status: Home / Self Care | Payer: Self-pay | Source: Home / Self Care | Attending: Ophthalmology

## 2020-10-07 DIAGNOSIS — H2181 Floppy iris syndrome: Secondary | ICD-10-CM | POA: Diagnosis not present

## 2020-10-07 DIAGNOSIS — Z79899 Other long term (current) drug therapy: Secondary | ICD-10-CM | POA: Diagnosis not present

## 2020-10-07 DIAGNOSIS — Z7951 Long term (current) use of inhaled steroids: Secondary | ICD-10-CM | POA: Insufficient documentation

## 2020-10-07 DIAGNOSIS — Z8546 Personal history of malignant neoplasm of prostate: Secondary | ICD-10-CM | POA: Diagnosis not present

## 2020-10-07 DIAGNOSIS — H2511 Age-related nuclear cataract, right eye: Secondary | ICD-10-CM | POA: Diagnosis not present

## 2020-10-07 DIAGNOSIS — Z950 Presence of cardiac pacemaker: Secondary | ICD-10-CM | POA: Diagnosis not present

## 2020-10-07 HISTORY — PX: CATARACT EXTRACTION W/PHACO: SHX586

## 2020-10-07 SURGERY — PHACOEMULSIFICATION, CATARACT, WITH IOL INSERTION
Anesthesia: General | Site: Eye | Laterality: Right

## 2020-10-07 MED ORDER — ACETAMINOPHEN 325 MG PO TABS: 325.0000 mg | ORAL_TABLET | Freq: Once | ORAL | Status: DC

## 2020-10-07 MED ORDER — ACETAMINOPHEN 160 MG/5ML PO SOLN: 325.0000 mg | Freq: Once | ORAL | Status: DC

## 2020-10-07 MED ORDER — SODIUM HYALURONATE 23 MG/ML IO SOLN
INTRAOCULAR | Status: DC | PRN
  Administered 2020-10-07: 0.6 mL via INTRAOCULAR

## 2020-10-07 MED ORDER — SODIUM HYALURONATE 10 MG/ML IO SOLN
INTRAOCULAR | Status: DC | PRN
  Administered 2020-10-07: 0.55 mL via INTRAOCULAR

## 2020-10-07 MED ORDER — LACTATED RINGERS IV SOLN: INTRAVENOUS | Status: DC

## 2020-10-07 MED ORDER — MOXIFLOXACIN HCL 0.5 % OP SOLN
OPHTHALMIC | Status: DC | PRN
  Administered 2020-10-07: 0.2 mL via OPHTHALMIC

## 2020-10-07 MED ORDER — TETRACAINE HCL 0.5 % OP SOLN
1.0000 [drp] | OPHTHALMIC | Status: DC | PRN
  Administered 2020-10-07 (×3): 1 [drp] via OPHTHALMIC

## 2020-10-07 MED ORDER — ARMC OPHTHALMIC DILATING DROPS
1.0000 "application " | OPHTHALMIC | Status: DC | PRN
  Administered 2020-10-07 (×3): 1 via OPHTHALMIC

## 2020-10-07 MED ORDER — LIDOCAINE HCL (PF) 2 % IJ SOLN
INTRAOCULAR | Status: DC | PRN
  Administered 2020-10-07: 4 mL via INTRAOCULAR

## 2020-10-07 MED ORDER — FENTANYL CITRATE (PF) 100 MCG/2ML IJ SOLN
INTRAMUSCULAR | Status: DC | PRN
  Administered 2020-10-07: 50 ug via INTRAVENOUS

## 2020-10-07 MED ORDER — EPINEPHRINE PF 1 MG/ML IJ SOLN
INTRAOCULAR | Status: DC | PRN
  Administered 2020-10-07: 94 mL via OPHTHALMIC

## 2020-10-07 SURGICAL SUPPLY — 20 items
CANNULA ANT/CHMB 27G (MISCELLANEOUS) ×2 IMPLANT
CANNULA ANT/CHMB 27GA (MISCELLANEOUS) ×4 IMPLANT
DISSECTOR HYDRO NUCLEUS 50X22 (MISCELLANEOUS) ×2 IMPLANT
GLOVE SURG LX 7.5 STRW (GLOVE) ×1
GLOVE SURG LX STRL 7.5 STRW (GLOVE) ×1 IMPLANT
GLOVE SURG SYN 8.5  E (GLOVE) ×2
GLOVE SURG SYN 8.5 E (GLOVE) ×1 IMPLANT
GLOVE SURG SYN 8.5 PF PI (GLOVE) ×1 IMPLANT
GOWN STRL REUS W/ TWL LRG LVL3 (GOWN DISPOSABLE) ×2 IMPLANT
GOWN STRL REUS W/TWL LRG LVL3 (GOWN DISPOSABLE) ×4
LENS IOL TECNIS EYHANCE 17.5 (Intraocular Lens) ×1 IMPLANT
MARKER SKIN DUAL TIP RULER LAB (MISCELLANEOUS) ×2 IMPLANT
PACK DR. KING ARMS (PACKS) ×2 IMPLANT
PACK EYE AFTER SURG (MISCELLANEOUS) ×2 IMPLANT
PACK OPTHALMIC (MISCELLANEOUS) ×2 IMPLANT
RING MALYGIN (MISCELLANEOUS) ×1 IMPLANT
SYR 3ML LL SCALE MARK (SYRINGE) ×2 IMPLANT
SYR TB 1ML LUER SLIP (SYRINGE) ×2 IMPLANT
WATER STERILE IRR 250ML POUR (IV SOLUTION) ×2 IMPLANT
WIPE NON LINTING 3.25X3.25 (MISCELLANEOUS) ×2 IMPLANT
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
## 2020-10-07 NOTE — Op Note (Signed)
OPERATIVE NOTE  Todd Webster [id number] 10/07/2020   PREOPERATIVE DIAGNOSIS:  Nuclear sclerotic cataract right eye.  H25.11   POSTOPERATIVE DIAGNOSIS:    1.   Nuclear sclerotic cataract right eye.   2.  Intraoperative floppy iris syndrome.    PROCEDURE:  Complex Phacoemusification with posterior chamber intraocular lens placement of the right eye , CPT [phone]  LENS:   Implant Name Type Inv. Item Serial No. Manufacturer Lot No. LRB No. Used Action  LENS IOL TECNIS EYHANCE 17.5 - [id number] Intraocular Lens LENS IOL TECNIS EYHANCE 17.5 [id number] JOHNSON   Right 1 Implanted       Procedure(s): CATARACT EXTRACTION PHACO AND INTRAOCULAR LENS PLACEMENT (IOC) RIGHT MALYUGIN 12.56 01:10.2 (Right)     ULTRASOUND TIME: 1 minutes 10 seconds.  CDE 12.56   SURGEON:  Benay Pillow, MD, MPH  ANESTHESIOLOGIST: Anesthesiologist: Ronelle Nigh, MD CRNA: Jeannene Patella, CRNA   ANESTHESIA:  Topical with tetracaine drops augmented with 1% preservative-free intracameral lidocaine.  ESTIMATED BLOOD LOSS: less than 1 mL.   COMPLICATIONS:  None.   DESCRIPTION OF PROCEDURE:  The patient was identified in the holding room and transported to the operating room and placed in the supine position under the operating microscope.  The right eye was identified as the operative eye and it was prepped and draped in the usual sterile ophthalmic fashion.   A 1.0 millimeter clear-corneal paracentesis was made at the 10:30 position. 0.5 ml of preservative-free 1% lidocaine with epinephrine was injected into the anterior chamber.  The anterior chamber was filled with Healon 5 viscoelastic.    The pupil was small and the iris was floppy with notable pseudoexfoliative material on the lens capsule, so a Malyugin ring was placed with a ring manipulator without difficulty.  A 2.4 millimeter keratome was used to make a near-clear corneal incision at the 8:00 position.  A curvilinear capsulorrhexis was made  with a cystotome and capsulorrhexis forceps.  Balanced salt solution was used to hydrodissect and hydrodelineate the nucleus.   Phacoemulsification was then used in stop and chop fashion to remove the lens nucleus and epinucleus.  The remaining cortex was then removed using the irrigation and aspiration handpiece. Healon was then placed into the capsular bag to distend it for lens placement.  A lens was then injected into the capsular bag.    The ring was removed without difficulty.  The remaining viscoelastic was aspirated.   Wounds were hydrated with balanced salt solution.  The anterior chamber was inflated to a physiologic pressure with balanced salt solution.   Intracameral vigamox 0.1 mL undiluted was injected into the eye and a drop placed onto the ocular surface.  No wound leaks were noted.  The patient was taken to the recovery room in stable condition without complications of anesthesia or surgery  Benay Pillow 10/07/2020, 12:26 PM

## 2020-10-07 NOTE — Anesthesia Postprocedure Evaluation (Signed)
Anesthesia Post Note  Patient: Todd JENNY Sr.  Procedure(s) Performed: CATARACT EXTRACTION PHACO AND INTRAOCULAR LENS PLACEMENT ([place]) RIGHT MALYUGIN 12.56 01:10.2 (Right Eye)     Patient location during evaluation: PACU Anesthesia Type: General Level of consciousness: awake and alert and oriented Pain management: satisfactory to patient Vital Signs Assessment: post-procedure vital signs reviewed and stable Respiratory status: spontaneous breathing, nonlabored ventilation and respiratory function stable Cardiovascular status: blood pressure returned to baseline and stable Postop Assessment: Adequate PO intake and No signs of nausea or vomiting Anesthetic complications: no   No complications documented.  Raliegh Ip

## 2020-10-07 NOTE — Anesthesia Procedure Notes (Signed)
Procedure Name: MAC Date/Time: 10/07/2020 12:02 PM Performed by: Jeannene Patella, CRNA Pre-anesthesia Checklist: Patient identified, Emergency Drugs available, Suction available, Timeout performed and Patient being monitored Patient Re-evaluated:Patient Re-evaluated prior to induction Oxygen Delivery Method: Nasal cannula Placement Confirmation: positive ETCO2
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
## 2020-10-07 NOTE — Transfer of Care (Signed)
Immediate Anesthesia Transfer of Care Note  Patient: Todd SEAMANS Sr.  Procedure(s) Performed: CATARACT EXTRACTION PHACO AND INTRAOCULAR LENS PLACEMENT ([place]) RIGHT MALYUGIN 12.56 01:10.2 (Right Eye)  Patient Location: PACU  Anesthesia Type: General  Level of Consciousness: awake, alert  and patient cooperative  Airway and Oxygen Therapy: Patient Spontanous Breathing and Patient connected to supplemental oxygen  Post-op Assessment: Post-op Vital signs reviewed, Patient's Cardiovascular Status Stable, Respiratory Function Stable, Patent Airway and No signs of Nausea or vomiting  Post-op Vital Signs: Reviewed and stable  Complications: No complications documented.

## 2021-01-15 ENCOUNTER — Emergency Department
Admission: EM | Admit: 2021-01-15 | Discharge: 2021-01-15 | Disposition: A | Discharge status: Home / Self Care | Payer: Medicare Other | Attending: Emergency Medicine | Admitting: Emergency Medicine

## 2021-01-15 ENCOUNTER — Emergency Department: Payer: Medicare Other

## 2021-01-15 ENCOUNTER — Encounter: Payer: Self-pay | Admitting: Emergency Medicine

## 2021-01-15 ENCOUNTER — Other Ambulatory Visit: Payer: Self-pay

## 2021-01-15 DIAGNOSIS — F17210 Nicotine dependence, cigarettes, uncomplicated: Secondary | ICD-10-CM | POA: Diagnosis not present

## 2021-01-15 DIAGNOSIS — R079 Chest pain, unspecified: Secondary | ICD-10-CM | POA: Diagnosis present

## 2021-01-15 DIAGNOSIS — Z79899 Other long term (current) drug therapy: Secondary | ICD-10-CM | POA: Diagnosis not present

## 2021-01-15 DIAGNOSIS — J449 Chronic obstructive pulmonary disease, unspecified: Secondary | ICD-10-CM | POA: Insufficient documentation

## 2021-01-15 DIAGNOSIS — Z720 Tobacco use: Secondary | ICD-10-CM

## 2021-01-15 DIAGNOSIS — I129 Hypertensive chronic kidney disease with stage 1 through stage 4 chronic kidney disease, or unspecified chronic kidney disease: Secondary | ICD-10-CM | POA: Diagnosis not present

## 2021-01-15 DIAGNOSIS — Z7952 Long term (current) use of systemic steroids: Secondary | ICD-10-CM | POA: Diagnosis not present

## 2021-01-15 DIAGNOSIS — F419 Anxiety disorder, unspecified: Secondary | ICD-10-CM

## 2021-01-15 DIAGNOSIS — N189 Chronic kidney disease, unspecified: Secondary | ICD-10-CM | POA: Diagnosis not present

## 2021-01-15 LAB — BASIC METABOLIC PANEL
Anion gap: 8 (ref 5–15)
BUN: 16 mg/dL (ref 8–23)
CO2: 25 mmol/L (ref 22–32)
Calcium: 9.4 mg/dL (ref 8.9–10.3)
Chloride: 100 mmol/L (ref 98–111)
Creatinine, Ser: 0.71 mg/dL (ref 0.61–1.24)
GFR, Estimated: >60 mL/min (ref >60)
Glucose, Bld: 113 mg/dL — ABNORMAL HIGH (ref 70–99)
Potassium: 4.1 mmol/L (ref 3.5–5.1)
Sodium: 133 mmol/L — ABNORMAL LOW (ref 135–145)

## 2021-01-15 LAB — CBC
HCT: 33.5 % — ABNORMAL LOW (ref 39.0–52.0)
Hemoglobin: 11.7 g/dL — ABNORMAL LOW (ref 13.0–17.0)
MCH: 32.6 pg (ref 26.0–34.0)
MCHC: 34.9 g/dL (ref 30.0–36.0)
MCV: 93.3 fL (ref 80.0–100.0)
Platelets: 259 10*3/uL (ref 150–400)
RBC: 3.59 MIL/uL — ABNORMAL LOW (ref 4.22–5.81)
RDW: 14.2 % (ref 11.5–15.5)
WBC: 6.3 10*3/uL (ref 4.0–10.5)
nRBC: 0 % (ref 0.0–0.2)

## 2021-01-15 LAB — TROPONIN I (HIGH SENSITIVITY)
Troponin I (High Sensitivity): 13 ng/L (ref <18)
Troponin I (High Sensitivity): 15 ng/L (ref <18)
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
## 2021-03-04 ENCOUNTER — Ambulatory Visit: Payer: Medicare Other | Admitting: Dermatology

## 2021-03-12 ENCOUNTER — Inpatient Hospital Stay: Payer: Medicare Other | Attending: Radiation Oncology

## 2021-03-12 DIAGNOSIS — C61 Malignant neoplasm of prostate: Secondary | ICD-10-CM | POA: Diagnosis not present

## 2021-03-12 LAB — PSA: Prostatic Specific Antigen: 0.26 ng/mL (ref 0.00–4.00)

## 2021-03-19 ENCOUNTER — Ambulatory Visit
Admission: RE | Admit: 2021-03-19 | Discharge: 2021-03-19 | Disposition: A | Discharge status: Home / Self Care | Payer: Medicare Other | Source: Ambulatory Visit | Attending: Radiation Oncology | Admitting: Radiation Oncology

## 2021-03-19 ENCOUNTER — Encounter: Payer: Self-pay | Admitting: Radiation Oncology

## 2021-03-19 VITALS — BP 86/51 | HR 71 | Temp 98.1°F | Wt 126.0 lb

## 2021-03-19 DIAGNOSIS — R351 Nocturia: Secondary | ICD-10-CM | POA: Insufficient documentation

## 2021-03-19 DIAGNOSIS — F17210 Nicotine dependence, cigarettes, uncomplicated: Secondary | ICD-10-CM | POA: Insufficient documentation

## 2021-03-19 DIAGNOSIS — C61 Malignant neoplasm of prostate: Secondary | ICD-10-CM | POA: Diagnosis present

## 2021-03-19 DIAGNOSIS — B1920 Unspecified viral hepatitis C without hepatic coma: Secondary | ICD-10-CM | POA: Diagnosis not present

## 2021-03-19 DIAGNOSIS — N189 Chronic kidney disease, unspecified: Secondary | ICD-10-CM | POA: Diagnosis not present

## 2021-03-19 DIAGNOSIS — Z923 Personal history of irradiation: Secondary | ICD-10-CM | POA: Diagnosis not present

## 2021-03-19 DIAGNOSIS — J449 Chronic obstructive pulmonary disease, unspecified: Secondary | ICD-10-CM | POA: Diagnosis not present

## 2021-03-19 DIAGNOSIS — R9721 Rising PSA following treatment for malignant neoplasm of prostate: Secondary | ICD-10-CM | POA: Diagnosis not present

## 2021-03-19 DIAGNOSIS — K5900 Constipation, unspecified: Secondary | ICD-10-CM | POA: Insufficient documentation

## 2021-03-19 DIAGNOSIS — I129 Hypertensive chronic kidney disease with stage 1 through stage 4 chronic kidney disease, or unspecified chronic kidney disease: Secondary | ICD-10-CM | POA: Diagnosis not present

## 2021-03-19 NOTE — Progress Notes (Signed)
Radiation Oncology Follow up Note  Name: Todd CIRILO Sr.   Date:   03/19/2021 MRN:  [id number] DOB: 1941/05/10    This 80 y.o. male presents to the clinic today for 79-monthfollow-up status post IMRT radiation therapy for stage II adeno adenocarcinoma prostate presenting with a PSA in the 10 range..Marland Kitchen REFERRING PROVIDER: TJodi Marble MD  HPI: Patient is a 80 year old male now out 79 months having completed IMRT radiation therapy to his prostate for Gleason 7 mostly 3+4) adenocarcinoma.  Seen today in routine follow-up from a urologic standpoint he is doing fairly well he is currently on Flomax still has nocturia x4-5.  He was recently seen in the ER for chest pain.  His PSA has picked up slightly 0.26 from 0.0 to 6 months prior.  Patient has multiple Co. morbidities including chronic renal disease COPD hepatitis C hypertension MI with pacemaker.  He is resumed smoking also.  He states he tends towards constipation.  COMPLICATIONS OF TREATMENT: none  FOLLOW UP COMPLIANCE: keeps appointments   PHYSICAL EXAM:  BP (!) 86/51   Pulse 71   Temp 98.1 F (36.7 C) (Tympanic)   Wt 126 lb (57.2 kg)   BMI 17.09 kg/m  Wheelchair-bound frail male in NAD.  Well-developed well-nourished patient in NAD. HEENT reveals PERLA, EOMI, discs not visualized.  Oral cavity is clear. No oral mucosal lesions are identified. Neck is clear without evidence of cervical or supraclavicular adenopathy. Lungs are clear to A&P. Cardiac examination is essentially unremarkable with regular rate and rhythm without murmur rub or thrill. Abdomen is benign with no organomegaly or masses noted. Motor sensory and DTR levels are equal and symmetric in the upper and lower extremities. Cranial nerves II through XII are grossly intact. Proprioception is intact. No peripheral adenopathy or edema is identified. No motor or sensory levels are noted. Crude visual fields are within normal range.  RADIOLOGY RESULTS: No current films to  review  PLAN: Present time I will see him back in 6 months with a repeat PSA should he start having elevation of his PSA will refer to medical oncology for initiation of treatment.  Otherwise patient knows to call with any concerns.  I would like to take this opportunity to thank you for allowing me to participate in the care of your patient..Noreene Filbert MD

## 2021-04-13 ENCOUNTER — Telehealth: Payer: Self-pay | Admitting: Urology

## 2021-04-13 DIAGNOSIS — N2889 Other specified disorders of kidney and ureter: Secondary | ICD-10-CM

## 2021-04-13 NOTE — Telephone Encounter (Signed)
Please let patient know I have put in an order for a follow-up CT scan to be performed prior to his appointment November 2022 and follow-up of the small renal mass we have been following.

## 2021-04-14 NOTE — Telephone Encounter (Signed)
Notified patient as instructed, patient pleased. Discussed follow-up appointments, patient agrees  

## 2021-06-09 ENCOUNTER — Ambulatory Visit: Payer: Medicare Other

## 2021-06-11 ENCOUNTER — Other Ambulatory Visit: Payer: Self-pay

## 2021-06-11 ENCOUNTER — Ambulatory Visit
Admission: RE | Admit: 2021-06-11 | Discharge: 2021-06-11 | Disposition: A | Discharge status: Home / Self Care | Payer: Medicare Other | Source: Ambulatory Visit | Attending: Urology | Admitting: Urology

## 2021-06-11 DIAGNOSIS — N2889 Other specified disorders of kidney and ureter: Secondary | ICD-10-CM | POA: Insufficient documentation

## 2021-06-11 LAB — POCT I-STAT CREATININE: Creatinine, Ser: 0.6 mg/dL — ABNORMAL LOW (ref 0.61–1.24)

## 2021-06-11 MED ORDER — IOHEXOL 300 MG/ML  SOLN
100.0000 mL | Freq: Once | INTRAMUSCULAR | Status: AC | PRN
  Administered 2021-06-11: 11:00:00 100 mL via INTRAVENOUS

## 2021-06-18 ENCOUNTER — Encounter: Payer: Self-pay | Admitting: Urology

## 2021-06-18 ENCOUNTER — Ambulatory Visit (INDEPENDENT_AMBULATORY_CARE_PROVIDER_SITE_OTHER): Payer: Medicare Other | Admitting: Urology

## 2021-06-18 ENCOUNTER — Other Ambulatory Visit: Payer: Self-pay

## 2021-06-18 VITALS — BP 87/60 | HR 125 | Ht 72.0 in | Wt 117.0 lb

## 2021-06-18 DIAGNOSIS — C61 Malignant neoplasm of prostate: Secondary | ICD-10-CM

## 2021-06-18 DIAGNOSIS — N2889 Other specified disorders of kidney and ureter: Secondary | ICD-10-CM
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
## 2021-07-12 ENCOUNTER — Other Ambulatory Visit: Payer: Self-pay

## 2021-07-12 ENCOUNTER — Encounter: Payer: Self-pay | Admitting: Emergency Medicine

## 2021-07-12 ENCOUNTER — Emergency Department: Payer: Medicare Other

## 2021-07-12 ENCOUNTER — Emergency Department
Admission: EM | Admit: 2021-07-12 | Discharge: 2021-07-12 | Disposition: A | Discharge status: Home / Self Care | Payer: Medicare Other | Attending: Emergency Medicine | Admitting: Emergency Medicine

## 2021-07-12 DIAGNOSIS — Z87891 Personal history of nicotine dependence: Secondary | ICD-10-CM | POA: Insufficient documentation

## 2021-07-12 DIAGNOSIS — Z20822 Contact with and (suspected) exposure to covid-19: Secondary | ICD-10-CM | POA: Insufficient documentation

## 2021-07-12 DIAGNOSIS — G8929 Other chronic pain: Secondary | ICD-10-CM | POA: Insufficient documentation

## 2021-07-12 DIAGNOSIS — Z950 Presence of cardiac pacemaker: Secondary | ICD-10-CM | POA: Diagnosis not present

## 2021-07-12 DIAGNOSIS — Z8546 Personal history of malignant neoplasm of prostate: Secondary | ICD-10-CM | POA: Insufficient documentation

## 2021-07-12 DIAGNOSIS — I959 Hypotension, unspecified: Secondary | ICD-10-CM | POA: Diagnosis not present

## 2021-07-12 DIAGNOSIS — M545 Low back pain, unspecified: Secondary | ICD-10-CM | POA: Diagnosis not present

## 2021-07-12 DIAGNOSIS — R531 Weakness: Secondary | ICD-10-CM

## 2021-07-12 DIAGNOSIS — I129 Hypertensive chronic kidney disease with stage 1 through stage 4 chronic kidney disease, or unspecified chronic kidney disease: Secondary | ICD-10-CM | POA: Diagnosis not present

## 2021-07-12 DIAGNOSIS — N189 Chronic kidney disease, unspecified: Secondary | ICD-10-CM | POA: Insufficient documentation

## 2021-07-12 DIAGNOSIS — J449 Chronic obstructive pulmonary disease, unspecified: Secondary | ICD-10-CM | POA: Diagnosis not present

## 2021-07-12 DIAGNOSIS — R053 Chronic cough: Secondary | ICD-10-CM | POA: Insufficient documentation

## 2021-07-12 DIAGNOSIS — Z79899 Other long term (current) drug therapy: Secondary | ICD-10-CM | POA: Insufficient documentation

## 2021-07-12 LAB — URINALYSIS, ROUTINE W REFLEX MICROSCOPIC
Bilirubin Urine: NEGATIVE
Glucose, UA: NEGATIVE mg/dL
Hgb urine dipstick: NEGATIVE
Ketones, ur: NEGATIVE mg/dL
Leukocytes,Ua: NEGATIVE
Nitrite: NEGATIVE
Protein, ur: NEGATIVE mg/dL
Specific Gravity, Urine: 1.019 (ref 1.005–1.030)
pH: 6 (ref 5.0–8.0)

## 2021-07-12 LAB — CBC
HCT: 37.3 % — ABNORMAL LOW (ref 39.0–52.0)
Hemoglobin: 12.5 g/dL — ABNORMAL LOW (ref 13.0–17.0)
MCH: 33.5 pg (ref 26.0–34.0)
MCHC: 33.5 g/dL (ref 30.0–36.0)
MCV: 100 fL (ref 80.0–100.0)
Platelets: 232 10*3/uL (ref 150–400)
RBC: 3.73 MIL/uL — ABNORMAL LOW (ref 4.22–5.81)
RDW: 14.8 % (ref 11.5–15.5)
WBC: 6.7 10*3/uL (ref 4.0–10.5)
nRBC: 0 % (ref 0.0–0.2)

## 2021-07-12 LAB — HEPATIC FUNCTION PANEL
ALT: 10 U/L (ref 0–44)
AST: 17 U/L (ref 15–41)
Albumin: 3.9 g/dL (ref 3.5–5.0)
Alkaline Phosphatase: 87 U/L (ref 38–126)
Bilirubin, Direct: 0.1 mg/dL (ref 0.0–0.2)
Indirect Bilirubin: 0.8 mg/dL (ref 0.3–0.9)
Total Bilirubin: 0.9 mg/dL (ref 0.3–1.2)
Total Protein: 7.1 g/dL (ref 6.5–8.1)

## 2021-07-12 LAB — TROPONIN I (HIGH SENSITIVITY): Troponin I (High Sensitivity): 14 ng/L (ref <18)

## 2021-07-12 LAB — BASIC METABOLIC PANEL
Anion gap: 6 (ref 5–15)
BUN: 17 mg/dL (ref 8–23)
CO2: 26 mmol/L (ref 22–32)
Calcium: 9.1 mg/dL (ref 8.9–10.3)
Chloride: 105 mmol/L (ref 98–111)
Creatinine, Ser: 0.64 mg/dL (ref 0.61–1.24)
GFR, Estimated: >60 mL/min (ref >60)
Glucose, Bld: 98 mg/dL (ref 70–99)
Potassium: 4.8 mmol/L (ref 3.5–5.1)
Sodium: 137 mmol/L (ref 135–145)

## 2021-07-12 LAB — RESP PANEL BY RT-PCR (FLU A&B, COVID) ARPGX2
Influenza A by PCR: NEGATIVE
Influenza B by PCR: NEGATIVE
SARS Coronavirus 2 by RT PCR: NEGATIVE

## 2021-07-12 LAB — T4, FREE: Free T4: 0.97 ng/dL (ref 0.61–1.12)

## 2021-07-12 LAB — TSH: TSH: 1.936 u[IU]/mL (ref 0.350–4.500)

## 2021-07-12 LAB — MAGNESIUM: Magnesium: 1.9 mg/dL (ref 1.7–2.4)

## 2021-07-12 MED ORDER — SODIUM CHLORIDE 0.9 % IV BOLUS
1000.0000 mL | Freq: Once | INTRAVENOUS | Status: AC
  Administered 2021-07-12: 1000 mL via INTRAVENOUS

## 2021-07-12 NOTE — ED Triage Notes (Signed)
Pt reports went to MD office yesterday for weakness and his BP was low. Pt reports MD advised him to come to the ED for IV fluids because they were not available at the MD office. Pt reports no pain but does feel weak and when we check his BP we will understand why

## 2021-07-12 NOTE — ED Notes (Signed)
Patient transported to X-ray 

## 2021-07-12 NOTE — ED Notes (Signed)
Pt helped to use urinal in bed. Pt's son updated with pt's permission.
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
## 2021-07-12 NOTE — ED Notes (Signed)
Patient was given a warm blanket per request. Wife at bedside. Patient denies discomfort.

## 2021-07-22 ENCOUNTER — Telehealth: Payer: Self-pay

## 2021-07-22 NOTE — Telephone Encounter (Signed)
Per Dr. Bernardo Heater pt was a one time Eligard injection.

## 2021-07-31 ENCOUNTER — Emergency Department: Payer: Medicare Other

## 2021-07-31 ENCOUNTER — Inpatient Hospital Stay
Admission: EM | Admit: 2021-07-31 | Discharge: 2021-08-07 | DRG: 177 | Disposition: A | Discharge status: Home Health | Payer: Medicare Other | Attending: Obstetrics and Gynecology | Admitting: Obstetrics and Gynecology

## 2021-07-31 ENCOUNTER — Encounter: Payer: Self-pay | Admitting: Emergency Medicine

## 2021-07-31 ENCOUNTER — Other Ambulatory Visit: Payer: Self-pay

## 2021-07-31 DIAGNOSIS — Z888 Allergy status to other drugs, medicaments and biological substances status: Secondary | ICD-10-CM

## 2021-07-31 DIAGNOSIS — J449 Chronic obstructive pulmonary disease, unspecified: Secondary | ICD-10-CM | POA: Diagnosis present

## 2021-07-31 DIAGNOSIS — I959 Hypotension, unspecified: Secondary | ICD-10-CM | POA: Diagnosis present

## 2021-07-31 DIAGNOSIS — J438 Other emphysema: Secondary | ICD-10-CM | POA: Diagnosis present

## 2021-07-31 DIAGNOSIS — R0602 Shortness of breath: Secondary | ICD-10-CM | POA: Diagnosis not present

## 2021-07-31 DIAGNOSIS — I110 Hypertensive heart disease with heart failure: Secondary | ICD-10-CM | POA: Diagnosis present

## 2021-07-31 DIAGNOSIS — I442 Atrioventricular block, complete: Secondary | ICD-10-CM | POA: Diagnosis present

## 2021-07-31 DIAGNOSIS — R7881 Bacteremia: Secondary | ICD-10-CM

## 2021-07-31 DIAGNOSIS — U071 COVID-19: Principal | ICD-10-CM | POA: Diagnosis present

## 2021-07-31 DIAGNOSIS — E860 Dehydration: Secondary | ICD-10-CM | POA: Diagnosis not present

## 2021-07-31 DIAGNOSIS — Z79899 Other long term (current) drug therapy: Secondary | ICD-10-CM

## 2021-07-31 DIAGNOSIS — Z681 Body mass index (BMI) 19 or less, adult: Secondary | ICD-10-CM

## 2021-07-31 DIAGNOSIS — H409 Unspecified glaucoma: Secondary | ICD-10-CM | POA: Diagnosis present

## 2021-07-31 DIAGNOSIS — E43 Unspecified severe protein-calorie malnutrition: Secondary | ICD-10-CM | POA: Diagnosis present

## 2021-07-31 DIAGNOSIS — C61 Malignant neoplasm of prostate: Secondary | ICD-10-CM | POA: Diagnosis present

## 2021-07-31 DIAGNOSIS — I5043 Acute on chronic combined systolic (congestive) and diastolic (congestive) heart failure: Secondary | ICD-10-CM | POA: Diagnosis present

## 2021-07-31 DIAGNOSIS — I5033 Acute on chronic diastolic (congestive) heart failure: Secondary | ICD-10-CM | POA: Diagnosis present

## 2021-07-31 DIAGNOSIS — J439 Emphysema, unspecified: Secondary | ICD-10-CM | POA: Diagnosis present

## 2021-07-31 DIAGNOSIS — F419 Anxiety disorder, unspecified: Secondary | ICD-10-CM | POA: Diagnosis present

## 2021-07-31 DIAGNOSIS — J432 Centrilobular emphysema: Secondary | ICD-10-CM | POA: Diagnosis present

## 2021-07-31 DIAGNOSIS — Z7951 Long term (current) use of inhaled steroids: Secondary | ICD-10-CM

## 2021-07-31 DIAGNOSIS — Z791 Long term (current) use of non-steroidal anti-inflammatories (NSAID): Secondary | ICD-10-CM

## 2021-07-31 DIAGNOSIS — I2510 Atherosclerotic heart disease of native coronary artery without angina pectoris: Secondary | ICD-10-CM | POA: Diagnosis present

## 2021-07-31 DIAGNOSIS — R778 Other specified abnormalities of plasma proteins: Secondary | ICD-10-CM

## 2021-07-31 DIAGNOSIS — Z950 Presence of cardiac pacemaker: Secondary | ICD-10-CM

## 2021-07-31 DIAGNOSIS — Z720 Tobacco use: Secondary | ICD-10-CM

## 2021-07-31 DIAGNOSIS — I700 Atherosclerosis of aorta: Secondary | ICD-10-CM | POA: Diagnosis present

## 2021-07-31 DIAGNOSIS — N400 Enlarged prostate without lower urinary tract symptoms: Secondary | ICD-10-CM | POA: Diagnosis present

## 2021-07-31 DIAGNOSIS — Z8619 Personal history of other infectious and parasitic diseases: Secondary | ICD-10-CM

## 2021-07-31 DIAGNOSIS — K219 Gastro-esophageal reflux disease without esophagitis: Secondary | ICD-10-CM | POA: Diagnosis present

## 2021-07-31 DIAGNOSIS — I252 Old myocardial infarction: Secondary | ICD-10-CM

## 2021-07-31 DIAGNOSIS — E871 Hypo-osmolality and hyponatremia: Secondary | ICD-10-CM | POA: Diagnosis present

## 2021-07-31 DIAGNOSIS — R54 Age-related physical debility: Secondary | ICD-10-CM | POA: Diagnosis present

## 2021-07-31 DIAGNOSIS — Z87442 Personal history of urinary calculi: Secondary | ICD-10-CM

## 2021-07-31 DIAGNOSIS — F17210 Nicotine dependence, cigarettes, uncomplicated: Secondary | ICD-10-CM | POA: Diagnosis present

## 2021-07-31 DIAGNOSIS — Z8701 Personal history of pneumonia (recurrent): Secondary | ICD-10-CM

## 2021-07-31 DIAGNOSIS — F32A Depression, unspecified: Secondary | ICD-10-CM | POA: Diagnosis present

## 2021-07-31 LAB — RESP PANEL BY RT-PCR (FLU A&B, COVID) ARPGX2
Influenza A by PCR: NEGATIVE
Influenza B by PCR: NEGATIVE
SARS Coronavirus 2 by RT PCR: POSITIVE — AB

## 2021-07-31 LAB — CBC WITH DIFFERENTIAL/PLATELET
Abs Immature Granulocytes: 0.02 10*3/uL (ref 0.00–0.07)
Basophils Absolute: 0 10*3/uL (ref 0.0–0.1)
Basophils Relative: 1 %
Eosinophils Absolute: 0.1 10*3/uL (ref 0.0–0.5)
Eosinophils Relative: 2 %
HCT: 33.9 % — ABNORMAL LOW (ref 39.0–52.0)
Hemoglobin: 11.4 g/dL — ABNORMAL LOW (ref 13.0–17.0)
Immature Granulocytes: 0 %
Lymphocytes Relative: 24 %
Lymphs Abs: 1.4 10*3/uL (ref 0.7–4.0)
MCH: 33.6 pg (ref 26.0–34.0)
MCHC: 33.6 g/dL (ref 30.0–36.0)
MCV: 100 fL (ref 80.0–100.0)
Monocytes Absolute: 0.6 10*3/uL (ref 0.1–1.0)
Monocytes Relative: 11 %
Neutro Abs: 3.7 10*3/uL (ref 1.7–7.7)
Neutrophils Relative %: 62 %
Platelets: 193 10*3/uL (ref 150–400)
RBC: 3.39 MIL/uL — ABNORMAL LOW (ref 4.22–5.81)
RDW: 14.7 % (ref 11.5–15.5)
WBC: 6 10*3/uL (ref 4.0–10.5)
nRBC: 0 % (ref 0.0–0.2)

## 2021-07-31 LAB — TROPONIN I (HIGH SENSITIVITY)
Troponin I (High Sensitivity): 24 ng/L — ABNORMAL HIGH (ref <18)
Troponin I (High Sensitivity): 29 ng/L — ABNORMAL HIGH (ref <18)

## 2021-07-31 LAB — BLOOD GAS, VENOUS
Acid-Base Excess: 3 mmol/L — ABNORMAL HIGH (ref 0.0–2.0)
Bicarbonate: 27 mmol/L (ref 20.0–28.0)
O2 Saturation: 93.8 %
Patient temperature: 37
pCO2, Ven: 38 mmHg — ABNORMAL LOW (ref 44.0–60.0)
pH, Ven: 7.46 — ABNORMAL HIGH (ref 7.250–7.430)
pO2, Ven: 66 mmHg — ABNORMAL HIGH (ref 32.0–45.0)

## 2021-07-31 LAB — COMPREHENSIVE METABOLIC PANEL
ALT: 12 U/L (ref 0–44)
AST: 20 U/L (ref 15–41)
Albumin: 4 g/dL (ref 3.5–5.0)
Alkaline Phosphatase: 81 U/L (ref 38–126)
Anion gap: 6 (ref 5–15)
BUN: 19 mg/dL (ref 8–23)
CO2: 24 mmol/L (ref 22–32)
Calcium: 8.9 mg/dL (ref 8.9–10.3)
Chloride: 104 mmol/L (ref 98–111)
Creatinine, Ser: 0.82 mg/dL (ref 0.61–1.24)
GFR, Estimated: >60 mL/min (ref >60)
Glucose, Bld: 98 mg/dL (ref 70–99)
Potassium: 3.9 mmol/L (ref 3.5–5.1)
Sodium: 134 mmol/L — ABNORMAL LOW (ref 135–145)
Total Bilirubin: 0.7 mg/dL (ref 0.3–1.2)
Total Protein: 7.2 g/dL (ref 6.5–8.1)

## 2021-07-31 LAB — LACTIC ACID, PLASMA: Lactic Acid, Venous: 0.9 mmol/L (ref 0.5–1.9)

## 2021-07-31 LAB — PROTEIN, TOTAL: Total Protein: 6.8 g/dL (ref 6.5–8.1)

## 2021-07-31 LAB — PROTIME-INR
INR: 1.3 — ABNORMAL HIGH (ref 0.8–1.2)
Prothrombin Time: 15.7 seconds — ABNORMAL HIGH (ref 11.4–15.2)

## 2021-07-31 LAB — BRAIN NATRIURETIC PEPTIDE: B Natriuretic Peptide: 284.6 pg/mL — ABNORMAL HIGH (ref 0.0–100.0)

## 2021-07-31 LAB — PROCALCITONIN: Procalcitonin: <0.1 ng/mL

## 2021-07-31 LAB — APTT: aPTT: 40 seconds — ABNORMAL HIGH (ref 24–36)

## 2021-07-31 LAB — D-DIMER, QUANTITATIVE: D-Dimer, Quant: 1.45 ug/mL-FEU — ABNORMAL HIGH (ref 0.00–0.50)

## 2021-07-31 MED ORDER — HYDROXYZINE HCL 25 MG PO TABS
25.0000 mg | ORAL_TABLET | Freq: Four times a day (QID) | ORAL | Status: DC | PRN
  Administered 2021-08-04 – 2021-08-05 (×3): 25 mg via ORAL
  Filled 2021-07-31 (×4): qty 1

## 2021-07-31 MED ORDER — BRINZOLAMIDE 1 % OP SUSP
1.0000 [drp] | Freq: Two times a day (BID) | OPHTHALMIC | Status: DC
  Administered 2021-08-01 – 2021-08-07 (×13): 1 [drp] via OPHTHALMIC
  Filled 2021-07-31: qty 10

## 2021-07-31 MED ORDER — ALBUTEROL SULFATE HFA 108 (90 BASE) MCG/ACT IN AERS
2.0000 | INHALATION_SPRAY | RESPIRATORY_TRACT | Status: DC | PRN
  Filled 2021-07-31 (×2): qty 6.7

## 2021-07-31 MED ORDER — LACTATED RINGERS IV BOLUS
1000.0000 mL | Freq: Once | INTRAVENOUS | Status: AC
  Administered 2021-07-31: 16:00:00 1000 mL via INTRAVENOUS

## 2021-07-31 MED ORDER — DEXAMETHASONE SODIUM PHOSPHATE 10 MG/ML IJ SOLN
10.0000 mg | Freq: Once | INTRAMUSCULAR | Status: AC
  Administered 2021-07-31: 15:00:00 10 mg via INTRAVENOUS
  Filled 2021-07-31: qty 1

## 2021-07-31 MED ORDER — ALBUTEROL SULFATE (2.5 MG/3ML) 0.083% IN NEBU: 2.5000 mg | INHALATION_SOLUTION | RESPIRATORY_TRACT | Status: DC | PRN

## 2021-07-31 MED ORDER — ENOXAPARIN SODIUM 40 MG/0.4ML IJ SOSY
40.0000 mg | PREFILLED_SYRINGE | INTRAMUSCULAR | Status: DC
  Administered 2021-07-31 – 2021-08-06 (×7): 40 mg via SUBCUTANEOUS
  Filled 2021-07-31 (×7): qty 0.4

## 2021-07-31 MED ORDER — IPRATROPIUM-ALBUTEROL 20-100 MCG/ACT IN AERS
1.0000 | INHALATION_SPRAY | Freq: Four times a day (QID) | RESPIRATORY_TRACT | Status: DC
Start: 2021-07-31 — End: 2021-08-08
  Administered 2021-08-01 – 2021-08-07 (×23): 1 via RESPIRATORY_TRACT
  Filled 2021-07-31 (×2): qty 4

## 2021-07-31 MED ORDER — FUROSEMIDE 10 MG/ML IJ SOLN
20.0000 mg | Freq: Two times a day (BID) | INTRAMUSCULAR | Status: DC
  Administered 2021-07-31 – 2021-08-01 (×2): 20 mg via INTRAVENOUS
  Filled 2021-07-31 (×2): qty 4

## 2021-07-31 MED ORDER — DEXAMETHASONE 6 MG PO TABS
6.0000 mg | ORAL_TABLET | Freq: Every day | ORAL | Status: DC
  Administered 2021-08-01 – 2021-08-04 (×4): 6 mg via ORAL
  Filled 2021-07-31 (×4): qty 1

## 2021-07-31 MED ORDER — PANTOPRAZOLE SODIUM 40 MG PO TBEC
40.0000 mg | DELAYED_RELEASE_TABLET | Freq: Every day | ORAL | Status: DC
  Administered 2021-08-01 – 2021-08-07 (×7): 40 mg via ORAL
  Filled 2021-07-31 (×7): qty 1

## 2021-07-31 MED ORDER — LOSARTAN POTASSIUM 25 MG PO TABS
25.0000 mg | ORAL_TABLET | Freq: Every day | ORAL | Status: DC
  Administered 2021-07-31 – 2021-08-02 (×3): 25 mg via ORAL
  Filled 2021-07-31 (×3): qty 1

## 2021-07-31 MED ORDER — ACETAMINOPHEN 325 MG PO TABS
ORAL_TABLET | ORAL | Status: AC
  Filled 2021-07-31: qty 2

## 2021-07-31 MED ORDER — BRIMONIDINE TARTRATE 0.2 % OP SOLN
1.0000 [drp] | Freq: Two times a day (BID) | OPHTHALMIC | Status: DC
  Administered 2021-08-02 – 2021-08-07 (×11): 1 [drp] via OPHTHALMIC
  Filled 2021-07-31 (×2): qty 5

## 2021-07-31 MED ORDER — ACETAMINOPHEN 325 MG PO TABS
650.0000 mg | ORAL_TABLET | Freq: Once | ORAL | Status: AC
  Administered 2021-07-31: 09:00:00 650 mg via ORAL

## 2021-07-31 MED ORDER — ASPIRIN 81 MG PO CHEW
324.0000 mg | CHEWABLE_TABLET | Freq: Once | ORAL | Status: AC
  Administered 2021-07-31: 10:00:00 324 mg via ORAL
  Filled 2021-07-31: qty 4

## 2021-07-31 MED ORDER — SODIUM CHLORIDE 0.9 % IV SOLN: INTRAVENOUS | Status: AC

## 2021-07-31 MED ORDER — SODIUM CHLORIDE 0.9 % IV SOLN
100.0000 mg | Freq: Every day | INTRAVENOUS | Status: AC
  Administered 2021-08-01 – 2021-08-04 (×4): 100 mg via INTRAVENOUS
  Filled 2021-07-31 (×4): qty 100

## 2021-07-31 MED ORDER — TIZANIDINE HCL 4 MG PO TABS
4.0000 mg | ORAL_TABLET | Freq: Three times a day (TID) | ORAL | Status: DC
  Administered 2021-08-01 – 2021-08-04 (×10): 4 mg via ORAL
  Filled 2021-07-31 (×4): qty 1
  Filled 2021-07-31 (×2): qty 2
  Filled 2021-07-31 (×6): qty 1

## 2021-07-31 MED ORDER — ACETAMINOPHEN 325 MG PO TABS
650.0000 mg | ORAL_TABLET | Freq: Four times a day (QID) | ORAL | Status: DC | PRN
  Administered 2021-08-02 – 2021-08-05 (×3): 650 mg via ORAL
  Filled 2021-07-31 (×4): qty 2

## 2021-07-31 MED ORDER — METOCLOPRAMIDE HCL 10 MG PO TABS
5.0000 mg | ORAL_TABLET | Freq: Once | ORAL | Status: AC
  Administered 2021-07-31: 18:00:00 5 mg via ORAL
  Filled 2021-07-31: qty 1

## 2021-07-31 MED ORDER — TAMSULOSIN HCL 0.4 MG PO CAPS
0.4000 mg | ORAL_CAPSULE | Freq: Every day | ORAL | Status: DC
  Administered 2021-08-01 – 2021-08-07 (×7): 0.4 mg via ORAL
  Filled 2021-07-31 (×7): qty 1

## 2021-07-31 MED ORDER — ACETAMINOPHEN 650 MG RE SUPP: 650.0000 mg | Freq: Four times a day (QID) | RECTAL | Status: DC | PRN

## 2021-07-31 MED ORDER — ACETAMINOPHEN 325 MG PO TABS
ORAL_TABLET | ORAL | Status: AC
  Filled 2021-07-31: qty 1

## 2021-07-31 MED ORDER — SODIUM CHLORIDE 0.9 % IV SOLN
200.0000 mg | Freq: Once | INTRAVENOUS | Status: AC
  Administered 2021-07-31: 13:00:00 200 mg via INTRAVENOUS
  Filled 2021-07-31: qty 200

## 2021-07-31 MED ORDER — SENNA 8.6 MG PO TABS
1.0000 | ORAL_TABLET | Freq: Two times a day (BID) | ORAL | Status: DC
  Administered 2021-07-31 – 2021-08-07 (×9): 8.6 mg via ORAL
  Filled 2021-07-31 (×11): qty 1

## 2021-07-31 MED ORDER — ATORVASTATIN CALCIUM 10 MG PO TABS
10.0000 mg | ORAL_TABLET | Freq: Every day | ORAL | Status: DC
  Administered 2021-08-01 – 2021-08-07 (×7): 10 mg via ORAL
  Filled 2021-07-31 (×7): qty 1

## 2021-07-31 MED ORDER — BUSPIRONE HCL 5 MG PO TABS
5.0000 mg | ORAL_TABLET | Freq: Two times a day (BID) | ORAL | Status: DC
  Administered 2021-08-01 – 2021-08-07 (×14): 5 mg via ORAL
  Filled 2021-07-31 (×15): qty 1

## 2021-07-31 MED ORDER — UMECLIDINIUM-VILANTEROL 62.5-25 MCG/ACT IN AEPB
1.0000 | INHALATION_SPRAY | Freq: Every day | RESPIRATORY_TRACT | Status: DC
  Administered 2021-08-01 – 2021-08-07 (×7): 1 via RESPIRATORY_TRACT
  Filled 2021-07-31 (×2): qty 14

## 2021-07-31 MED ORDER — LATANOPROST 0.005 % OP SOLN
1.0000 [drp] | Freq: Every day | OPHTHALMIC | Status: DC
Start: 2021-07-31 — End: 2021-08-08
  Administered 2021-08-02 – 2021-08-06 (×5): 1 [drp] via OPHTHALMIC
  Filled 2021-07-31 (×2): qty 2.5

## 2021-07-31 MED ORDER — IOHEXOL 350 MG/ML SOLN
75.0000 mL | Freq: Once | INTRAVENOUS | Status: AC | PRN
  Administered 2021-07-31: 12:00:00 75 mL via INTRAVENOUS
  Filled 2021-07-31: qty 75

## 2021-07-31 MED ORDER — LACTATED RINGERS IV BOLUS
1000.0000 mL | Freq: Once | INTRAVENOUS | Status: AC
  Administered 2021-07-31: 11:00:00 1000 mL via INTRAVENOUS

## 2021-07-31 NOTE — ED Triage Notes (Signed)
Pt to triage via w/c with no distress noted; pt reports SHOB since last night; nonprod cough; denies pain

## 2021-07-31 NOTE — ED Notes (Signed)
Admitting at bedside 
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
## 2021-07-31 NOTE — Consult Note (Signed)
Remdesivir - Pharmacy Brief Note   O:  ALT: 12 CXR: No evidence of acute chest disease. Stable COPD chest. SpO2: 98% on RA   A/P:  Remdesivir 200 mg IVPB once followed by 100 mg IVPB daily x 4 days.   Ines Rebel Rodriguez-Guzman PharmD, BCPS 07/31/2021 11:43 AM
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
## 2021-08-01 ENCOUNTER — Observation Stay
Admit: 2021-08-01 | Discharge: 2021-08-01 | Disposition: A | Discharge status: Home / Self Care | Payer: Medicare Other | Attending: Internal Medicine | Admitting: Internal Medicine

## 2021-08-01 DIAGNOSIS — I252 Old myocardial infarction: Secondary | ICD-10-CM | POA: Diagnosis not present

## 2021-08-01 DIAGNOSIS — J432 Centrilobular emphysema: Secondary | ICD-10-CM | POA: Diagnosis present

## 2021-08-01 DIAGNOSIS — E43 Unspecified severe protein-calorie malnutrition: Secondary | ICD-10-CM | POA: Diagnosis present

## 2021-08-01 DIAGNOSIS — Z87442 Personal history of urinary calculi: Secondary | ICD-10-CM | POA: Diagnosis not present

## 2021-08-01 DIAGNOSIS — I442 Atrioventricular block, complete: Secondary | ICD-10-CM | POA: Diagnosis present

## 2021-08-01 DIAGNOSIS — J438 Other emphysema: Secondary | ICD-10-CM | POA: Diagnosis present

## 2021-08-01 DIAGNOSIS — H409 Unspecified glaucoma: Secondary | ICD-10-CM | POA: Diagnosis present

## 2021-08-01 DIAGNOSIS — K219 Gastro-esophageal reflux disease without esophagitis: Secondary | ICD-10-CM | POA: Diagnosis present

## 2021-08-01 DIAGNOSIS — J439 Emphysema, unspecified: Secondary | ICD-10-CM | POA: Diagnosis not present

## 2021-08-01 DIAGNOSIS — I959 Hypotension, unspecified: Secondary | ICD-10-CM | POA: Diagnosis present

## 2021-08-01 DIAGNOSIS — F17210 Nicotine dependence, cigarettes, uncomplicated: Secondary | ICD-10-CM | POA: Diagnosis present

## 2021-08-01 DIAGNOSIS — R7881 Bacteremia: Secondary | ICD-10-CM | POA: Diagnosis not present

## 2021-08-01 DIAGNOSIS — I700 Atherosclerosis of aorta: Secondary | ICD-10-CM | POA: Diagnosis present

## 2021-08-01 DIAGNOSIS — E871 Hypo-osmolality and hyponatremia: Secondary | ICD-10-CM | POA: Diagnosis present

## 2021-08-01 DIAGNOSIS — R0602 Shortness of breath: Secondary | ICD-10-CM | POA: Diagnosis present

## 2021-08-01 DIAGNOSIS — I2510 Atherosclerotic heart disease of native coronary artery without angina pectoris: Secondary | ICD-10-CM | POA: Diagnosis present

## 2021-08-01 DIAGNOSIS — F419 Anxiety disorder, unspecified: Secondary | ICD-10-CM | POA: Diagnosis present

## 2021-08-01 DIAGNOSIS — C61 Malignant neoplasm of prostate: Secondary | ICD-10-CM | POA: Diagnosis present

## 2021-08-01 DIAGNOSIS — E860 Dehydration: Secondary | ICD-10-CM | POA: Diagnosis present

## 2021-08-01 DIAGNOSIS — Z681 Body mass index (BMI) 19 or less, adult: Secondary | ICD-10-CM | POA: Diagnosis not present

## 2021-08-01 DIAGNOSIS — I110 Hypertensive heart disease with heart failure: Secondary | ICD-10-CM | POA: Diagnosis present

## 2021-08-01 DIAGNOSIS — F32A Depression, unspecified: Secondary | ICD-10-CM | POA: Diagnosis present

## 2021-08-01 DIAGNOSIS — B957 Other staphylococcus as the cause of diseases classified elsewhere: Secondary | ICD-10-CM | POA: Diagnosis not present

## 2021-08-01 DIAGNOSIS — I5043 Acute on chronic combined systolic (congestive) and diastolic (congestive) heart failure: Secondary | ICD-10-CM | POA: Diagnosis present

## 2021-08-01 DIAGNOSIS — Z950 Presence of cardiac pacemaker: Secondary | ICD-10-CM | POA: Diagnosis not present

## 2021-08-01 DIAGNOSIS — U071 COVID-19: Secondary | ICD-10-CM | POA: Diagnosis present

## 2021-08-01 DIAGNOSIS — Z8701 Personal history of pneumonia (recurrent): Secondary | ICD-10-CM | POA: Diagnosis not present

## 2021-08-01 DIAGNOSIS — N400 Enlarged prostate without lower urinary tract symptoms: Secondary | ICD-10-CM | POA: Diagnosis present

## 2021-08-01 LAB — BLOOD CULTURE ID PANEL (REFLEXED) - BCID2

## 2021-08-01 LAB — CBC WITH DIFFERENTIAL/PLATELET
Abs Immature Granulocytes: 0.01 10*3/uL (ref 0.00–0.07)
Basophils Absolute: 0 10*3/uL (ref 0.0–0.1)
Basophils Relative: 0 %
Eosinophils Absolute: 0 10*3/uL (ref 0.0–0.5)
Eosinophils Relative: 0 %
HCT: 32.5 % — ABNORMAL LOW (ref 39.0–52.0)
Hemoglobin: 11.1 g/dL — ABNORMAL LOW (ref 13.0–17.0)
Immature Granulocytes: 0 %
Lymphocytes Relative: 20 %
Lymphs Abs: 0.8 10*3/uL (ref 0.7–4.0)
MCH: 33.8 pg (ref 26.0–34.0)
MCHC: 34.2 g/dL (ref 30.0–36.0)
MCV: 99.1 fL (ref 80.0–100.0)
Monocytes Absolute: 0.5 10*3/uL (ref 0.1–1.0)
Monocytes Relative: 13 %
Neutro Abs: 2.5 10*3/uL (ref 1.7–7.7)
Neutrophils Relative %: 67 %
Platelets: 142 10*3/uL — ABNORMAL LOW (ref 150–400)
RBC: 3.28 MIL/uL — ABNORMAL LOW (ref 4.22–5.81)
RDW: 14.5 % (ref 11.5–15.5)
WBC: 3.8 10*3/uL — ABNORMAL LOW (ref 4.0–10.5)
nRBC: 0 % (ref 0.0–0.2)

## 2021-08-01 LAB — BASIC METABOLIC PANEL
Anion gap: 9 (ref 5–15)
BUN: 20 mg/dL (ref 8–23)
CO2: 26 mmol/L (ref 22–32)
Calcium: 8.7 mg/dL — ABNORMAL LOW (ref 8.9–10.3)
Chloride: 102 mmol/L (ref 98–111)
Creatinine, Ser: 0.67 mg/dL (ref 0.61–1.24)
GFR, Estimated: >60 mL/min (ref >60)
Glucose, Bld: 107 mg/dL — ABNORMAL HIGH (ref 70–99)
Potassium: 4.1 mmol/L (ref 3.5–5.1)
Sodium: 137 mmol/L (ref 135–145)

## 2021-08-01 LAB — URINALYSIS, COMPLETE (UACMP) WITH MICROSCOPIC
Bacteria, UA: NONE SEEN
Bilirubin Urine: NEGATIVE
Glucose, UA: NEGATIVE mg/dL
Hgb urine dipstick: NEGATIVE
Ketones, ur: NEGATIVE mg/dL
Leukocytes,Ua: NEGATIVE
Nitrite: NEGATIVE
Protein, ur: NEGATIVE mg/dL
Specific Gravity, Urine: 1.016 (ref 1.005–1.030)
Squamous Epithelial / HPF: NONE SEEN (ref 0–5)
pH: 6 (ref 5.0–8.0)

## 2021-08-01 LAB — ECHOCARDIOGRAM COMPLETE
Height: 72 in
S' Lateral: 2 cm
Weight: 1888 oz

## 2021-08-01 LAB — PREALBUMIN: Prealbumin: 13.1 mg/dL — ABNORMAL LOW (ref 18–38)

## 2021-08-01 LAB — BRAIN NATRIURETIC PEPTIDE: B Natriuretic Peptide: 599.3 pg/mL — ABNORMAL HIGH (ref 0.0–100.0)

## 2021-08-01 LAB — LACTIC ACID, PLASMA: Lactic Acid, Venous: 1.9 mmol/L (ref 0.5–1.9)

## 2021-08-01 MED ORDER — ADULT MULTIVITAMIN W/MINERALS CH
1.0000 | ORAL_TABLET | Freq: Every day | ORAL | Status: DC
  Administered 2021-08-01 – 2021-08-07 (×6): 1 via ORAL
  Filled 2021-08-01 (×7): qty 1

## 2021-08-01 MED ORDER — MIDODRINE HCL 5 MG PO TABS
10.0000 mg | ORAL_TABLET | Freq: Three times a day (TID) | ORAL | Status: DC
  Administered 2021-08-01 – 2021-08-02 (×3): 10 mg via ORAL
  Filled 2021-08-01 (×3): qty 2

## 2021-08-01 MED ORDER — VANCOMYCIN HCL IN DEXTROSE 1-5 GM/200ML-% IV SOLN: 1000.0000 mg | Freq: Once | INTRAVENOUS | Status: DC

## 2021-08-01 MED ORDER — LACTATED RINGERS IV SOLN: INTRAVENOUS | Status: DC

## 2021-08-01 MED ORDER — ENSURE ENLIVE PO LIQD
237.0000 mL | Freq: Three times a day (TID) | ORAL | Status: DC
  Administered 2021-08-01 – 2021-08-07 (×17): 237 mL via ORAL

## 2021-08-01 MED ORDER — VANCOMYCIN HCL IN DEXTROSE 1-5 GM/200ML-% IV SOLN
1000.0000 mg | INTRAVENOUS | Status: DC
  Administered 2021-08-02: 1000 mg via INTRAVENOUS
  Filled 2021-08-01 (×2): qty 200

## 2021-08-01 MED ORDER — VANCOMYCIN HCL 1250 MG/250ML IV SOLN
1250.0000 mg | Freq: Once | INTRAVENOUS | Status: AC
  Administered 2021-08-01: 11:00:00 1250 mg via INTRAVENOUS
  Filled 2021-08-01: qty 250

## 2021-08-01 NOTE — ED Notes (Signed)
Informed RN bed assigned 

## 2021-08-01 NOTE — Progress Notes (Signed)
PHARMACY - PHYSICIAN COMMUNICATION CRITICAL VALUE ALERT - BLOOD CULTURE IDENTIFICATION (BCID)  Todd Webster. is an 80 y.o. male who presented to [facility] on 07/31/2021 with a chief complaint of weakness and Covid +.  Assessment:  Blood cultures with GPC in 4 of 4 bottles and BCID identified Staph species  Name of physician (or Provider) Contacted: [place]  Current antibiotics: none  Changes to prescribed antibiotics recommended:  Recommendations accepted by provider  Results for orders placed or performed during the hospital encounter of 07/31/21  Blood Culture ID Panel (Reflexed) (Collected: 07/31/2021  1:25 PM)  Result Value Ref Range   Enterococcus faecalis NOT DETECTED NOT DETECTED   Enterococcus Faecium NOT DETECTED NOT DETECTED   Listeria monocytogenes NOT DETECTED NOT DETECTED   Staphylococcus species DETECTED (A) NOT DETECTED   Staphylococcus aureus (BCID) NOT DETECTED NOT DETECTED   Staphylococcus epidermidis NOT DETECTED NOT DETECTED   Staphylococcus lugdunensis NOT DETECTED NOT DETECTED   Streptococcus species NOT DETECTED NOT DETECTED   Streptococcus agalactiae NOT DETECTED NOT DETECTED   Streptococcus pneumoniae NOT DETECTED NOT DETECTED   Streptococcus pyogenes NOT DETECTED NOT DETECTED   A.calcoaceticus-baumannii NOT DETECTED NOT DETECTED   Bacteroides fragilis NOT DETECTED NOT DETECTED   Enterobacterales NOT DETECTED NOT DETECTED   Enterobacter cloacae complex NOT DETECTED NOT DETECTED   Escherichia coli NOT DETECTED NOT DETECTED   Klebsiella aerogenes NOT DETECTED NOT DETECTED   Klebsiella oxytoca NOT DETECTED NOT DETECTED   Klebsiella pneumoniae NOT DETECTED NOT DETECTED   Proteus species NOT DETECTED NOT DETECTED   Salmonella species NOT DETECTED NOT DETECTED   Serratia marcescens NOT DETECTED NOT DETECTED   Haemophilus influenzae NOT DETECTED NOT DETECTED   Neisseria meningitidis NOT DETECTED NOT DETECTED   Pseudomonas aeruginosa NOT DETECTED NOT  DETECTED   Stenotrophomonas maltophilia NOT DETECTED NOT DETECTED   Candida albicans NOT DETECTED NOT DETECTED   Candida auris NOT DETECTED NOT DETECTED   Candida glabrata NOT DETECTED NOT DETECTED   Candida krusei NOT DETECTED NOT DETECTED   Candida parapsilosis NOT DETECTED NOT DETECTED   Candida tropicalis NOT DETECTED NOT DETECTED   Cryptococcus neoformans/gattii NOT DETECTED NOT DETECTED    Todd Webster 08/01/2021  11:05 AM

## 2021-08-01 NOTE — ED Notes (Signed)
Patient ambulated to bedside commode. Bed linen changed.

## 2021-08-01 NOTE — Progress Notes (Signed)
Initial Nutrition Assessment  DOCUMENTATION CODES:   Underweight  INTERVENTION:   -Ensure Enlive po TID, each supplement provides 350 kcal and 20 grams of protein  -Magic cup TID with meals, each supplement provides 290 kcal and 9 grams of protein  -MVI with minerals daily  NUTRITION DIAGNOSIS:   Increased nutrient needs related to chronic illness (COPD) as evidenced by estimated needs.  GOAL:   Patient will meet greater than or equal to 90% of their needs  MONITOR:   PO intake, Supplement acceptance, Labs, Weight trends, Skin, I & O's  REASON FOR ASSESSMENT:   Consult Assessment of nutrition requirement/status, Poor PO  ASSESSMENT:   Todd Webster. is a 80 y.o. male with medical history significant of hypertension, COPD, GERD, prostate cancer, and complete heart block status post pacemaker . By report he has had increased SOB/DOE, weakness-limited mobility and cough for the past 4-5 days. Due to his symptoms he presents to [facility]ED for evaluation. He denied N/V,D, urinary symptoms, sore throat. He does continue to smoke.  Pt admitted with acute dehydration and COVID-19 infection.    Pt unavailable at time of visit. RD unable to obtain further nutrition-related history or complete nutrition-focused physical exam at this time.    Pt currently on a regular diet. No meal completion data available to assess at this time.  Reviewed wt hx; pt has experienced a 7.8% wt loss over the past 6 months. While this is not significant for time frame, it is concerning given underweight status and multiple co-morbidities. Highly suspect pt with malnutrition, however, unable to identify at this time.  Pt with increased nutritional needs and would benefit from addition of oral nutrition supplements.   Medications reviewed and include decadron, lasix, remdesivir, and senokot.   Labs reviewed.   Diet Order:   Diet Order             Diet regular Room service appropriate? Yes with  Assist; Fluid consistency: Thin  Diet effective now                   EDUCATION NEEDS:   No education needs have been identified at this time  Skin:  Skin Assessment: Reviewed RN Assessment  Last BM:  Unknown  Height:   Ht Readings from Last 1 Encounters:  07/31/21 6' (1.829 m)    Weight:   Wt Readings from Last 1 Encounters:  07/31/21 53.5 kg    Ideal Body Weight:  80.9 kg  BMI:  Body mass index is 16 kg/m.  Estimated Nutritional Needs:   Kcal:  2150-2350  Protein:  10-120 grams  Fluid:  > 2 L    Loistine Chance, RD, LDN, [place] Registered Dietitian II Certified Diabetes Care and Education Specialist Please refer to [facility] for RD and/or RD on-call/weekend/after hours pager
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
## 2021-08-01 NOTE — Progress Notes (Signed)
Pharmacy Antibiotic Note  Todd Webster. is a 80 y.o. male admitted on 07/31/2021 with  weakness and Covid .  Blood cultures are growing GPC in 4 of 4 bottles and BCID has identified Staph species. Pharmacy has been consulted for Vancomycin dosing.  Plan: Vancomycin 1250mg  IV loading dose (~25mg /kg) Vancomycin 1000 mg IV Q 24 hrs. Goal AUC 400-550. Expected AUC: 504.8 SCr used: 0.8 Expected Cmin: 11.2   Height: 6' (182.9 cm) Weight: 53.5 kg (118 lb) IBW/kg (Calculated) : 77.6  Temp (24hrs), Avg:99.6 F (37.6 C), Min:97.9 F (36.6 C), Max:101.3 F (38.5 C)  Recent Labs  Lab 07/31/21 0413 07/31/21 1113 08/01/21 0658  WBC 6.0  --  3.8*  CREATININE 0.82  --  0.67  LATICACIDVEN  --  0.9  --     Estimated Creatinine Clearance: 56.7 mL/min (by C-G formula based on SCr of 0.67 mg/dL).    Allergies  Allergen Reactions   Grifulvin V [Griseofulvin] Other (See Comments)    Reaction: possible blood in urine    Antimicrobials this admission: Remdesivir 12/29 >>  Vancomycin 12/30 >>   Dose adjustments this admission:  Microbiology results: 12/29 BCx: GPC 4/4, BCID - Staph species  Thank you for allowing pharmacy to be a part of this patients care.  Paulina Fusi, PharmD, BCPS 08/01/2021 9:34 AM

## 2021-08-01 NOTE — ED Notes (Signed)
Admitting paged regarding patients blood pressure. Patient alert and talking. Reports some dizziness.

## 2021-08-01 NOTE — Progress Notes (Signed)
Lab called to draw stat Lactic.

## 2021-08-01 NOTE — Progress Notes (Signed)
*  PRELIMINARY RESULTS* Echocardiogram 2D Echocardiogram has been performed.  Todd Webster 08/01/2021, 7:41 AM
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
## 2021-08-02 LAB — COMPREHENSIVE METABOLIC PANEL
ALT: 13 U/L (ref 0–44)
AST: 23 U/L (ref 15–41)
Albumin: 3.1 g/dL — ABNORMAL LOW (ref 3.5–5.0)
Alkaline Phosphatase: 60 U/L (ref 38–126)
Anion gap: 7 (ref 5–15)
BUN: 21 mg/dL (ref 8–23)
CO2: 26 mmol/L (ref 22–32)
Calcium: 8.3 mg/dL — ABNORMAL LOW (ref 8.9–10.3)
Chloride: 99 mmol/L (ref 98–111)
Creatinine, Ser: 0.64 mg/dL (ref 0.61–1.24)
GFR, Estimated: >60 mL/min (ref >60)
Glucose, Bld: 112 mg/dL — ABNORMAL HIGH (ref 70–99)
Potassium: 4.1 mmol/L (ref 3.5–5.1)
Sodium: 132 mmol/L — ABNORMAL LOW (ref 135–145)
Total Bilirubin: 0.5 mg/dL (ref 0.3–1.2)
Total Protein: 5.6 g/dL — ABNORMAL LOW (ref 6.5–8.1)

## 2021-08-02 LAB — CBC
HCT: 30.7 % — ABNORMAL LOW (ref 39.0–52.0)
Hemoglobin: 10.2 g/dL — ABNORMAL LOW (ref 13.0–17.0)
MCH: 32.9 pg (ref 26.0–34.0)
MCHC: 33.2 g/dL (ref 30.0–36.0)
MCV: 99 fL (ref 80.0–100.0)
Platelets: 150 10*3/uL (ref 150–400)
RBC: 3.1 MIL/uL — ABNORMAL LOW (ref 4.22–5.81)
RDW: 14.6 % (ref 11.5–15.5)
WBC: 4.4 10*3/uL (ref 4.0–10.5)
nRBC: 0 % (ref 0.0–0.2)

## 2021-08-02 LAB — MAGNESIUM: Magnesium: 1.7 mg/dL (ref 1.7–2.4)

## 2021-08-02 LAB — PHOSPHORUS: Phosphorus: 3.2 mg/dL (ref 2.5–4.6)

## 2021-08-02 MED ORDER — IBUPROFEN 400 MG PO TABS
400.0000 mg | ORAL_TABLET | Freq: Three times a day (TID) | ORAL | Status: DC
Start: 2021-08-02 — End: 2021-08-04
  Administered 2021-08-02 – 2021-08-04 (×8): 400 mg via ORAL
  Filled 2021-08-02 (×8): qty 1

## 2021-08-02 MED ORDER — SODIUM CHLORIDE 0.9 % IV SOLN: INTRAVENOUS | Status: DC

## 2021-08-02 MED ORDER — SODIUM CHLORIDE 0.9 % IV BOLUS
250.0000 mL | Freq: Once | INTRAVENOUS | Status: AC
Start: 2021-08-02 — End: 2021-08-02
  Administered 2021-08-02: 250 mL via INTRAVENOUS

## 2021-08-02 MED ORDER — SODIUM CHLORIDE 0.9 % IV SOLN: INTRAVENOUS | Status: AC

## 2021-08-02 NOTE — Progress Notes (Signed)
PROGRESS NOTE   Todd Webster  YCX:[id number] DOB: 08/23/1940 DOA: 07/31/2021 PCP: Jodi Marble, MD  Brief Narrative:  80 year old white male Known history tobacco, COPD prostate cancer Complete heart block status post PPM 08/23/2019, HFpEF EF 45-50% Presented to [facility] ED short of breath Found to have T-max 102 point Some confusion BNP 280 Found to be COVID-positive Critical care consulted because of hypotension Blood cultures eventually showing Staphylococcus in 4/4 bottles and placed on vancomycin  Hospital-Problem based course  COVID-19 positive No respiratory symptoms-monitor only-treat X 3 days with remdesivir (ending 08/03/2021) and then  DC, can continue steroids for 5 days total Decadron ending 08/05/2021--keep on isolation at least 10 days ending 08/11/2021 Bacteremia secondary to Staphylococcus No recent injury/skin source, only device and that he has pacemaker that was placed in 2021 Blood culture X4 positive-no urine culture collected on admission Patient has a pacemaker that was placed 2024 by Dr. Laurelyn Sickle I have contacted his service to plan for TTE and eventual TEE as pacemaker wires may be the source of bacteremia HFpEF EF 45-50% Patient was hypotensive earlier in hospitalization because received Lasix X3 doses and started on losartan CCM consulted but prior to that started on midodrine- I think because of his bacteremia he needs more volume and we can assess that on a day-to-day basis-given a bolus of 250 cc and responded nicely with blood pressures into the 90s-continue saline 100 cc/h X 24 hours and reassess pressures at that time Discontinue losartan COPD/emphysema Resume Trelegy Ellipta inhaler, resume Combivent 4 times daily Depression Continue BuSpar 5 twice daily, hydroxyzine 25 every 6 as needed Reflux Continue Protonix 40 daily BPH Continue Flomax 0.4 at bedtime 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 He has no chest pain no fever Overall he feels about 50% of his normal  Objective: Vitals:   08/01/21 2344 08/02/21 0416 08/02/21 0810 08/02/21 1205  BP: (!) 106/46 111/66 118/63 122/67  Pulse: 72 73 77 72  Resp: 20 18 19 19   Temp: 97.8 F (36.6 C) 97.7 F (36.5 C) 97.9 F (36.6 C) 98 F (36.7 C)  TempSrc: Oral Oral Oral Oral  SpO2: 100% 99% 99% 99%  Weight:  54.5 kg    Height:        Intake/Output Summary (Last 24 hours) at 08/02/2021 1445 Last data filed at 08/02/2021 1206 Gross per 24 hour  Intake 1550.83 ml  Output 1400 ml  Net 150.83 ml   Filed Weights   07/31/21 0409 08/02/21 0416  Weight: 53.5 kg 54.5 kg    Examination:  EOMI NCAT no focal deficit CTA B no added sound no rales no rhonchi Abdomen soft Pacemaker site fluid on left side nontender ROM intact S1-S2 no murmur no rub no gallop Neurologically intact no focal deficit   Data Reviewed: personally reviewed   CBC    Component Value Date/Time   WBC 4.4 08/02/2021 0555   RBC 3.10 (L) 08/02/2021 0555   HGB 10.2 (L) 08/02/2021 0555   HCT 30.7 (L) 08/02/2021 0555   PLT 150 08/02/2021 0555   MCV 99.0 08/02/2021  0555   MCH 32.9 08/02/2021 0555   MCHC 33.2 08/02/2021 0555   RDW 14.6 08/02/2021 0555   LYMPHSABS 0.8 08/01/2021 0658   MONOABS 0.5 08/01/2021 0658   EOSABS 0.0 08/01/2021 0658   BASOSABS 0.0 08/01/2021 0658   CMP Latest Ref Rng & Units 08/02/2021 08/01/2021 07/31/2021  Glucose 70 - 99 mg/dL 112(H) 107(H) -  BUN 8 - 23 mg/dL 21 20 -  Creatinine 0.61 - 1.24 mg/dL 0.64 0.67 -  Sodium 135 - 145 mmol/L  132(L) 137 -  Potassium 3.5 - 5.1 mmol/L 4.1 4.1 -  Chloride 98 - 111 mmol/L 99 102 -  CO2 22 - 32 mmol/L 26 26 -  Calcium 8.9 - 10.3 mg/dL 8.3(L) 8.7(L) -  Total Protein 6.5 - 8.1 g/dL 5.6(L) - 6.8  Total Bilirubin 0.3 - 1.2 mg/dL 0.5 - -  Alkaline Phos 38 - 126 U/L 60 - -  AST 15 - 41 U/L 23 - -  ALT 0 - 44 U/L 13 - -     Radiology Studies: ECHOCARDIOGRAM COMPLETE  Result Date: 08/01/2021    ECHOCARDIOGRAM REPORT   Patient Name:   HENRI BAUMLER Sr. Date of Exam: 08/01/2021 Medical Rec #:  [id number]         Height:       72.0 in Accession #:    [id number]        Weight:       118.0 lb Date of Birth:  [birth year]         BSA:          1.703 m Patient Age:    21 years          BP:           109/55 mmHg Patient Gender: M                 HR:           71 bpm. Exam Location:  [facility] Procedure: 2D Echo, Cardiac Doppler and Color Doppler Indications:     CHF I50.9  History:         Patient has prior history of Echocardiogram examinations, most                  recent 08/15/2019. Previous Myocardial Infarction, Pacemaker;                  COPD.  Sonographer:     Sherrie Sport Referring Phys:  [id number] MICHAEL E NORINS Diagnosing Phys: Donnelly Angelica  Sonographer Comments: Technically challenging study due to limited acoustic windows, no apical window and no parasternal window. Image acquisition challenging due to patient body habitus. IMPRESSIONS  1. Left ventricular ejection fraction, by estimation, is 55 to 60%. The left ventricle has normal function. The left ventricle has no regional wall motion abnormalities.  2. Right ventricular systolic function is normal. The right ventricular size is normal.  3. The mitral valve is normal in structure. No evidence of mitral valve regurgitation.  4. The inferior vena cava is normal in size with greater than 50% respiratory variability, suggesting right atrial pressure of 3 mmHg. Conclusion(s)/Recommendation(s): LIMITED EVALUATION D/T POOR ACOUSTIC WINDOWS. FINDINGS  Left  Ventricle: Left ventricular ejection fraction, by estimation, is 55 to 60%. The left ventricle has normal function. The left ventricle has no regional wall motion abnormalities. The left ventricular internal cavity size was normal in size. Right Ventricle: The right ventricular size is normal. No increase in right ventricular wall  thickness. Right ventricular systolic function is normal. Pericardium: There is no evidence of pericardial effusion. Mitral Valve: The mitral valve is normal in structure. No evidence of mitral valve regurgitation. Tricuspid Valve: The tricuspid valve is grossly normal. Tricuspid valve regurgitation is not demonstrated. Venous: The inferior vena cava is normal in size with greater than 50% respiratory variability, suggesting right atrial pressure of 3 mmHg.  LEFT VENTRICLE PLAX 2D LVIDd:         2.80 cm LVIDs:         2.00 cm LV PW:         1.50 cm LV IVS:        1.40 cm  LEFT ATRIUM         Index LA diam:    4.80 cm 2.82 cm/m  PULMONIC VALVE PV Vmax:        1.26 m/s PV Vmean:       102.000 cm/s PV VTI:         0.273 m PV Peak grad:   6.4 mmHg PV Mean grad:   4.0 mmHg RVOT Peak grad: 10 mmHg   SHUNTS Pulmonic VTI: 0.344 m Donnelly Angelica Electronically signed by Donnelly Angelica Signature Date/Time: 08/01/2021/3:12:53 PM    Final      Scheduled Meds:  atorvastatin  10 mg Oral Daily   brinzolamide  1 drop Both Eyes BID AC & HS   And   brimonidine  1 drop Both Eyes BID AC & HS   busPIRone  5 mg Oral BID   dexamethasone  6 mg Oral Daily   enoxaparin (LOVENOX) injection  40 mg Subcutaneous Q24H   feeding supplement  237 mL Oral TID BM   ibuprofen  400 mg Oral TID   Ipratropium-Albuterol  1 puff Inhalation QID   latanoprost  1 drop Left Eye QHS   multivitamin with minerals  1 tablet Oral Daily   pantoprazole  40 mg Oral Daily   senna  1 tablet Oral BID   tamsulosin  0.4 mg Oral Daily   tiZANidine  4 mg Oral TID   umeclidinium-vilanterol  1 puff Inhalation Daily   Continuous  Infusions:  sodium chloride 100 mL/hr at 08/02/21 1136   remdesivir 100 mg in NS 100 mL 100 mg (08/02/21 0858)   vancomycin 1,000 mg (08/02/21 1014)     LOS: 1 day   Time spent: [place], MD [facility] To contact the attending provider between 7A-7P or the covering provider during after hours 7P-7A, please log into the web site [email or web address] and access using universal [place] password for that web site. If you do not have the password, please call the hospital operator.  08/02/2021, 2:45 PM

## 2021-08-02 NOTE — Progress Notes (Signed)
Pt just had large foamy, liquid brown stool smelling rancid like C-diff. Dr. Verlon Au notified and requested stool sample. No orders received.
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
## 2021-08-02 NOTE — Hospital Course (Signed)
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

## 2021-08-03 LAB — CULTURE, BLOOD (ROUTINE X 2)
Special Requests: ADEQUATE
Special Requests: ADEQUATE

## 2021-08-03 LAB — COMPREHENSIVE METABOLIC PANEL
ALT: 23 U/L (ref 0–44)
AST: 32 U/L (ref 15–41)
Albumin: 2.9 g/dL — ABNORMAL LOW (ref 3.5–5.0)
Alkaline Phosphatase: 54 U/L (ref 38–126)
Anion gap: 6 (ref 5–15)
BUN: 21 mg/dL (ref 8–23)
CO2: 23 mmol/L (ref 22–32)
Calcium: 8.2 mg/dL — ABNORMAL LOW (ref 8.9–10.3)
Chloride: 100 mmol/L (ref 98–111)
Creatinine, Ser: 0.6 mg/dL — ABNORMAL LOW (ref 0.61–1.24)
GFR, Estimated: >60 mL/min (ref >60)
Glucose, Bld: 111 mg/dL — ABNORMAL HIGH (ref 70–99)
Potassium: 4.3 mmol/L (ref 3.5–5.1)
Sodium: 129 mmol/L — ABNORMAL LOW (ref 135–145)
Total Bilirubin: 0.4 mg/dL (ref 0.3–1.2)
Total Protein: 5.3 g/dL — ABNORMAL LOW (ref 6.5–8.1)

## 2021-08-03 LAB — CBC WITH DIFFERENTIAL/PLATELET
Abs Immature Granulocytes: 0.03 10*3/uL (ref 0.00–0.07)
Basophils Absolute: 0 10*3/uL (ref 0.0–0.1)
Basophils Relative: 0 %
Eosinophils Absolute: 0 10*3/uL (ref 0.0–0.5)
Eosinophils Relative: 0 %
HCT: 29.8 % — ABNORMAL LOW (ref 39.0–52.0)
Hemoglobin: 10.2 g/dL — ABNORMAL LOW (ref 13.0–17.0)
Immature Granulocytes: 1 %
Lymphocytes Relative: 19 %
Lymphs Abs: 0.9 10*3/uL (ref 0.7–4.0)
MCH: 33.3 pg (ref 26.0–34.0)
MCHC: 34.2 g/dL (ref 30.0–36.0)
MCV: 97.4 fL (ref 80.0–100.0)
Monocytes Absolute: 0.5 10*3/uL (ref 0.1–1.0)
Monocytes Relative: 11 %
Neutro Abs: 3.3 10*3/uL (ref 1.7–7.7)
Neutrophils Relative %: 69 %
Platelets: 150 10*3/uL (ref 150–400)
RBC: 3.06 MIL/uL — ABNORMAL LOW (ref 4.22–5.81)
RDW: 14.3 % (ref 11.5–15.5)
WBC: 4.8 10*3/uL (ref 4.0–10.5)
nRBC: 0 % (ref 0.0–0.2)

## 2021-08-03 LAB — GLUCOSE, CAPILLARY
Glucose-Capillary: 150 mg/dL — ABNORMAL HIGH (ref 70–99)
Glucose-Capillary: 163 mg/dL — ABNORMAL HIGH (ref 70–99)

## 2021-08-03 LAB — OSMOLALITY: Osmolality: 280 mOsm/kg (ref 275–295)

## 2021-08-03 MED ORDER — SODIUM CHLORIDE 0.9 % IV SOLN
2.0000 g | INTRAVENOUS | Status: DC
  Administered 2021-08-03 – 2021-08-04 (×6): 2 g via INTRAVENOUS
  Filled 2021-08-03: qty 2000
  Filled 2021-08-03 (×2): qty 2
  Filled 2021-08-03: qty 2000
  Filled 2021-08-03 (×2): qty 2
  Filled 2021-08-03: qty 2000
  Filled 2021-08-03 (×2): qty 2
  Filled 2021-08-03: qty 2000

## 2021-08-03 NOTE — Progress Notes (Signed)
PROGRESS NOTE    Todd Webster  WCH:[id number] DOB: 1941/02/09 DOA: 07/31/2021 PCP: Jodi Marble, MD   Brief Narrative: Taken from prior notes. Todd Kocher Sr. is a 81 y.o. male with a PMH significant for HTN, COPD, GERD, prostate cancer, complete heart block s/p pacemaker. They presented from home to the ED on 07/31/2021 with SOB x5 days. In the ED, it was found that they had COVID. Additionally, he had hypotension which responded to IV fluid boluses, febrile, AMS. They were treated with supplemental oxygen, remdesivir, and steroid initiation.  Blood cultures, 4 out of 4 bottles came back positive for staph Capitis, pretty pansensitive. He was initially started on vancomycin which was transitioned to ampicillin today, on 08/03/2021. Repeat echocardiogram with normalization of EF. Cardiology was consulted for TEE, looks like no plan yet due to positive COVID status.  Subjective: Patient was seen and examined today.  No new complaints.  Had 1 episode of diarrhea earlier today, denies any abdominal pain.  Assessment & Plan:   Principal Problem:   Dehydration, moderate Active Problems:   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HCC)   Acute dehydration   COVID-19 virus infection   Emphysema lung (HCC)   Acute on chronic combined systolic and diastolic CHF (congestive heart failure) ([place])  COVID-19 infection.  Remained on room air. -Continue with remdesivir-day 4 -Continue with steroid-for another 3 days -Continue with supportive care and supplements  Staph Capitis bacteremia.  4 out of 4 positive blood culture bottles.  Pansensitive  High risk for endocarditis due to pacemaker in place.  Echocardiogram/TTE with normalization of EF and no valvular abnormality noted.  Will need TEE due to her risk. Looks like cardiology would like to defer due to positive COVID status. -Switch vancomycin with ampicillin -ID consult -Repeat blood cultures  History of HFrEF.  Appears euvolemic.   Normalization of EF on repeat TTE. -Continue to monitor  Hypertension.  Blood pressure seems within goal. Home antihypertensives were held due to softer blood pressure. -Continue to monitor  Hyponatremia.  Sodium at 129 today.  Clinically appears euvolemic.  Did received IV fluid -Check hyponatremia labs.  Third-degree heart block S/P pacemaker in place.  COPD.  No concern of exacerbation -Continue home bronchodilators  Objective: Vitals:   08/03/21 0035 08/03/21 0428 08/03/21 0724 08/03/21 1132  BP: 121/63 114/65 107/67 (!) 111/59  Pulse: 70 71 70 69  Resp: 18 18 20 15   Temp: 97.8 F (36.6 C) (!) 97.5 F (36.4 C) 98.2 F (36.8 C) 98.5 F (36.9 C)  TempSrc: Oral Oral    SpO2: 98% 98% 97% 99%  Weight:  58.4 kg    Height:        Intake/Output Summary (Last 24 hours) at 08/03/2021 1439 Last data filed at 08/03/2021 0445 Gross per 24 hour  Intake 240 ml  Output 1800 ml  Net -1560 ml   Filed Weights   07/31/21 0409 08/02/21 0416 08/03/21 0428  Weight: 53.5 kg 54.5 kg 58.4 kg    Examination:  General exam: Appears calm and comfortable  Respiratory system: Clear to auscultation. Respiratory effort normal. Cardiovascular system: S1 & S2 heard, RRR.  Gastrointestinal system: Soft, nontender, nondistended, bowel sounds positive. Central nervous system: Alert and oriented. No focal neurological deficits. Extremities: No edema, no cyanosis, pulses intact and symmetrical. Psychiatry: Judgement and insight appear normal.   DVT prophylaxis: Lovenox Code Status: Full Family Communication: Unable to reach wife on phone. Disposition Plan:  Status is: Inpatient  Remains inpatient  appropriate because: Severity of illness   Level of care: Progressive  All the records are reviewed and case discussed with Care Management/Social Worker. Management plans discussed with the patient, nursing and they are in agreement.  Consultants:  PCCM Cardiology  Procedures:   Antimicrobials:  Ampicillin  Data Reviewed: I have personally reviewed following labs and imaging studies  CBC: Recent Labs  Lab 07/31/21 0413 08/01/21 0658 08/02/21 0555 08/03/21 0628  WBC 6.0 3.8* 4.4 4.8  NEUTROABS 3.7 2.5  --  3.3  HGB 11.4* 11.1* 10.2* 10.2*  HCT 33.9* 32.5* 30.7* 29.8*  MCV 100.0 99.1 99.0 97.4  PLT 193 142* 150 735   Basic Metabolic Panel: Recent Labs  Lab 07/31/21 0413 08/01/21 0658 08/02/21 0555 08/03/21 0628  NA 134* 137 132* 129*  K 3.9 4.1 4.1 4.3  CL 104 102 99 100  CO2 24 26 26 23   GLUCOSE 98 107* 112* 111*  BUN 19 20 21 21   CREATININE 0.82 0.67 0.64 0.60*  CALCIUM 8.9 8.7* 8.3* 8.2*  MG  --   --  1.7  --   PHOS  --   --  3.2  --    GFR: Estimated Creatinine Clearance: 61.8 mL/min (A) (by C-G formula based on SCr of 0.6 mg/dL (L)). Liver Function Tests: Recent Labs  Lab 07/31/21 0413 07/31/21 0650 08/02/21 0555 08/03/21 0628  AST 20  --  23 32  ALT 12  --  13 23  ALKPHOS 81  --  60 54  BILITOT 0.7  --  0.5 0.4  PROT 7.2 6.8 5.6* 5.3*  ALBUMIN 4.0  --  3.1* 2.9*   No results for input(s): LIPASE, AMYLASE in the last 168 hours. No results for input(s): AMMONIA in the last 168 hours. Coagulation Profile: Recent Labs  Lab 07/31/21 1330  INR 1.3*   Cardiac Enzymes: No results for input(s): CKTOTAL, CKMB, CKMBINDEX, TROPONINI in the last 168 hours. BNP (last 3 results) No results for input(s): PROBNP in the last 8760 hours. HbA1C: No results for input(s): HGBA1C in the last 72 hours. CBG: Recent Labs  Lab 08/03/21 1129  GLUCAP 150*   Lipid Profile: No results for input(s): CHOL, HDL, LDLCALC, TRIG, CHOLHDL, LDLDIRECT in the last 72 hours. Thyroid Function Tests: No results for input(s): TSH, T4TOTAL, FREET4, T3FREE, THYROIDAB in the last 72 hours. Anemia Panel: No results for input(s): VITAMINB12, FOLATE, FERRITIN, TIBC, IRON, RETICCTPCT in the last 72 hours. Sepsis Labs: Recent Labs  Lab 07/31/21 0650  07/31/21 1113 08/01/21 1117  PROCALCITON <0.10  --   --   LATICACIDVEN  --  0.9 1.9    Recent Results (from the past 240 hour(s))  Resp Panel by RT-PCR (Flu A&B, Covid) Nasopharyngeal Swab     Status: Abnormal   Collection Time: 07/31/21  4:20 AM   Specimen: Nasopharyngeal Swab; Nasopharyngeal(NP) swabs in vial transport medium  Result Value Ref Range Status   SARS Coronavirus 2 by RT PCR POSITIVE (A) NEGATIVE Final    Comment: (NOTE) SARS-CoV-2 target nucleic acids are DETECTED.  The SARS-CoV-2 RNA is generally detectable in upper respiratory specimens during the acute phase of infection. Positive results are indicative of the presence of the identified virus, but do not rule out bacterial infection or co-infection with other pathogens not detected by the test. Clinical correlation with patient history and other diagnostic information is necessary to determine patient infection status. The expected result is Negative.  Fact Sheet for Patients: EntrepreneurPulse.com.au  Fact Sheet for Healthcare  Providers: IncredibleEmployment.be  This test is not yet approved or cleared by the [place] and  has been authorized for detection and/or diagnosis of SARS-CoV-2 by FDA under an Emergency Use Authorization (EUA).  This EUA will remain in effect (meaning this test can be used) for the duration of  the COVID-19 declaration under Section 564(b)(1) of the A ct, 21 U.S.C. section 360bbb-3(b)(1), unless the authorization is terminated or revoked sooner.     Influenza A by PCR NEGATIVE NEGATIVE Final   Influenza B by PCR NEGATIVE NEGATIVE Final    Comment: (NOTE) The Xpert Xpress SARS-CoV-2/FLU/RSV plus assay is intended as an aid in the diagnosis of influenza from Nasopharyngeal swab specimens and should not be used as a sole basis for treatment. Nasal washings and aspirates are unacceptable for Xpert Xpress  SARS-CoV-2/FLU/RSV testing.  Fact Sheet for Patients: EntrepreneurPulse.com.au  Fact Sheet for Healthcare Providers: IncredibleEmployment.be  This test is not yet approved or cleared by the [place] FDA and has been authorized for detection and/or diagnosis of SARS-CoV-2 by FDA under an Emergency Use Authorization (EUA). This EUA will remain in effect (meaning this test can be used) for the duration of the COVID-19 declaration under Section 564(b)(1) of the Act, 21 U.S.C. section 360bbb-3(b)(1), unless the authorization is terminated or revoked.  Performed at [facility], [address] [place], [place] [address]   Blood Culture (routine x 2)     Status: Abnormal   Collection Time: 07/31/21  1:24 PM   Specimen: BLOOD  Result Value Ref Range Status   Specimen Description   Final    BLOOD RIGHT ANTECUBITAL Performed at [facility], [address], [place], [place] [address]    Special Requests   Final    BOTTLES DRAWN AEROBIC AND ANAEROBIC Blood Culture adequate volume Performed at [facility], [address] [place], [place] [address]    Culture  Setup Time   Final    GRAM POSITIVE COCCI IN BOTH AEROBIC AND ANAEROBIC BOTTLES Gram Stain Report Called to,Read Back By and Verified With: LISA KLUTTZ 08/01/21 @ 0838 BY SB Performed at [facility], [address] [place], [place] [address]    Culture (A)  Final    STAPHYLOCOCCUS CAPITIS SUSCEPTIBILITIES PERFORMED ON PREVIOUS CULTURE WITHIN THE LAST 5 DAYS. Performed at [facility], [place] [address], [place], [place] [address]    Report Status 08/03/2021 FINAL  Final  Blood Culture (routine x 2)     Status: Abnormal   Collection Time: 07/31/21  1:25 PM   Specimen: BLOOD  Result Value Ref Range Status   Specimen Description   Final    BLOOD BLOOD RIGHT HAND Performed at [facility], [address] [place], [place]  [address]    Special Requests   Final    BOTTLES DRAWN AEROBIC AND ANAEROBIC Blood Culture adequate volume Performed at [facility], [address] [place], [place] [address]    Culture  Setup Time   Final    GRAM POSITIVE COCCI IN BOTH AEROBIC AND ANAEROBIC BOTTLES Gram Stain Report Called to,Read Back By and Verified With: LISA KLUTTZ 08/01/21 @ 0838 BY SB Performed at [facility], [place] [address] [place], [place] [address]    Culture STAPHYLOCOCCUS CAPITIS (A)  Final   Report Status 08/03/2021 FINAL  Final   Organism ID, Bacteria STAPHYLOCOCCUS CAPITIS  Final      Susceptibility   Staphylococcus capitis - MIC*  CIPROFLOXACIN <=0.5 SENSITIVE Sensitive     ERYTHROMYCIN <=0.25 SENSITIVE Sensitive     GENTAMICIN <=0.5 SENSITIVE Sensitive     OXACILLIN <=0.25 SENSITIVE Sensitive     TETRACYCLINE <=1 SENSITIVE Sensitive     VANCOMYCIN <=0.5 SENSITIVE Sensitive     TRIMETH/SULFA <=10 SENSITIVE Sensitive     CLINDAMYCIN <=0.25 SENSITIVE Sensitive     RIFAMPIN <=0.5 SENSITIVE Sensitive     Inducible Clindamycin NEGATIVE Sensitive     * STAPHYLOCOCCUS CAPITIS  Blood Culture ID Panel (Reflexed)     Status: Abnormal   Collection Time: 07/31/21  1:25 PM  Result Value Ref Range Status   Enterococcus faecalis NOT DETECTED NOT DETECTED Final   Enterococcus Faecium NOT DETECTED NOT DETECTED Final   Listeria monocytogenes NOT DETECTED NOT DETECTED Final   Staphylococcus species DETECTED (A) NOT DETECTED Final    Comment: CRITICAL RESULT CALLED TO, READ BACK BY AND VERIFIED WITH: LISA KLUTTZ 08/01/21 @ 0838 BY SB    Staphylococcus aureus (BCID) NOT DETECTED NOT DETECTED Final   Staphylococcus epidermidis NOT DETECTED NOT DETECTED Final   Staphylococcus lugdunensis NOT DETECTED NOT DETECTED Final   Streptococcus species NOT DETECTED NOT DETECTED Final   Streptococcus agalactiae NOT DETECTED NOT DETECTED Final   Streptococcus pneumoniae NOT DETECTED NOT DETECTED Final    Streptococcus pyogenes NOT DETECTED NOT DETECTED Final   A.calcoaceticus-baumannii NOT DETECTED NOT DETECTED Final   Bacteroides fragilis NOT DETECTED NOT DETECTED Final   Enterobacterales NOT DETECTED NOT DETECTED Final   Enterobacter cloacae complex NOT DETECTED NOT DETECTED Final   Escherichia coli NOT DETECTED NOT DETECTED Final   Klebsiella aerogenes NOT DETECTED NOT DETECTED Final   Klebsiella oxytoca NOT DETECTED NOT DETECTED Final   Klebsiella pneumoniae NOT DETECTED NOT DETECTED Final   Proteus species NOT DETECTED NOT DETECTED Final   Salmonella species NOT DETECTED NOT DETECTED Final   Serratia marcescens NOT DETECTED NOT DETECTED Final   Haemophilus influenzae NOT DETECTED NOT DETECTED Final   Neisseria meningitidis NOT DETECTED NOT DETECTED Final   Pseudomonas aeruginosa NOT DETECTED NOT DETECTED Final   Stenotrophomonas maltophilia NOT DETECTED NOT DETECTED Final   Candida albicans NOT DETECTED NOT DETECTED Final   Candida auris NOT DETECTED NOT DETECTED Final   Candida glabrata NOT DETECTED NOT DETECTED Final   Candida krusei NOT DETECTED NOT DETECTED Final   Candida parapsilosis NOT DETECTED NOT DETECTED Final   Candida tropicalis NOT DETECTED NOT DETECTED Final   Cryptococcus neoformans/gattii NOT DETECTED NOT DETECTED Final    Comment: Performed at [facility], [address], [place], [place] [address]     Radiology Studies: No results found.  Scheduled Meds:  atorvastatin  10 mg Oral Daily   brinzolamide  1 drop Both Eyes BID AC & HS   And   brimonidine  1 drop Both Eyes BID AC & HS   busPIRone  5 mg Oral BID   dexamethasone  6 mg Oral Daily   enoxaparin (LOVENOX) injection  40 mg Subcutaneous Q24H   feeding supplement  237 mL Oral TID BM   ibuprofen  400 mg Oral TID   Ipratropium-Albuterol  1 puff Inhalation QID   latanoprost  1 drop Left Eye QHS   multivitamin with minerals  1 tablet Oral Daily   pantoprazole  40 mg Oral Daily   senna  1  tablet Oral BID   tamsulosin  0.4 mg Oral Daily   tiZANidine  4 mg Oral TID   umeclidinium-vilanterol  1  puff Inhalation Daily   Continuous Infusions:  ampicillin (OMNIPEN) IV 2 g (08/03/21 1209)   remdesivir 100 mg in NS 100 mL 100 mg (08/03/21 1042)     LOS: 2 days   Time spent: 43 minutes. More than 50% of the time was spent in counseling/coordination of care  Lorella Nimrod, MD [facility]  If 7PM-7AM, please contact night-coverage [email or web address]  08/03/2021, 2:39 PM   This record has been created using Systems analyst. Errors have been sought and corrected,but may not always be located. Such creation errors do not reflect on the standard of care.

## 2021-08-04 LAB — BASIC METABOLIC PANEL
Anion gap: 5 (ref 5–15)
BUN: 21 mg/dL (ref 8–23)
CO2: 25 mmol/L (ref 22–32)
Calcium: 8 mg/dL — ABNORMAL LOW (ref 8.9–10.3)
Chloride: 100 mmol/L (ref 98–111)
Creatinine, Ser: 0.52 mg/dL — ABNORMAL LOW (ref 0.61–1.24)
GFR, Estimated: >60 mL/min (ref >60)
Glucose, Bld: 134 mg/dL — ABNORMAL HIGH (ref 70–99)
Potassium: 3.8 mmol/L (ref 3.5–5.1)
Sodium: 130 mmol/L — ABNORMAL LOW (ref 135–145)

## 2021-08-04 MED ORDER — CEFAZOLIN SODIUM-DEXTROSE 2-4 GM/100ML-% IV SOLN
2.0000 g | Freq: Three times a day (TID) | INTRAVENOUS | Status: DC
  Administered 2021-08-04 – 2021-08-07 (×9): 2 g via INTRAVENOUS
  Filled 2021-08-04 (×13): qty 100

## 2021-08-04 MED ORDER — TIZANIDINE HCL 4 MG PO TABS
4.0000 mg | ORAL_TABLET | Freq: Three times a day (TID) | ORAL | Status: DC | PRN
  Administered 2021-08-04 – 2021-08-07 (×3): 4 mg via ORAL
  Filled 2021-08-04 (×5): qty 1

## 2021-08-04 MED ORDER — IBUPROFEN 400 MG PO TABS: 400.0000 mg | ORAL_TABLET | Freq: Four times a day (QID) | ORAL | Status: DC | PRN

## 2021-08-04 NOTE — Evaluation (Signed)
Physical Therapy Evaluation Patient Details Name: Todd DALESANDRO Sr. MRN: [id number] DOB: Oct 25, 1940 Today's Date: 08/04/2021  History of Present Illness  Pt admitted for dehydration with complaints of cough. HIstory includes HTN, COPD, GERD, RA, prostate ca, and pacemaker. Pt also positive for covid at this time.  Clinical Impression  Pt is a pleasant 81 year old male who was admitted for dehydration and covid. Pt reports he is concerned that the MD told him he shouldn't be getting OOB. Despite encouragement from this PT and RN, pt still refuses to perform OOB this date. He reports he hasn't ambulated by choice in about a year since his back surgery and typically mobilizes around his hotel room in an office chair. Pt is wanting to obtain a light weight WC to remain mobility without walking. Pt agreeable to rolling in bed with mod I. Not agreeable to sitting at EOB. Pt demonstrates deficits with strength/mobility/pain. Would benefit from skilled PT to address above deficits and promote optimal return to PLOF. Recommend transition to [place] upon discharge from acute hospitalization.      Recommendations for follow up therapy are one component of a multi-disciplinary discharge planning process, led by the attending physician.  Recommendations may be updated based on patient status, additional functional criteria and insurance authorization.  Follow Up Recommendations Home health PT    Assistance Recommended at Discharge Intermittent Supervision/Assistance  Functional Status Assessment Patient has had a recent decline in their functional status and demonstrates the ability to make significant improvements in function in a reasonable and predictable amount of time.  Equipment Recommendations  BSC/3in1    Recommendations for Other Services       Precautions / Restrictions Precautions Precautions: Fall Restrictions Weight Bearing Restrictions: No      Mobility  Bed Mobility Overal bed  mobility: Modified Independent             General bed mobility comments: able to perform rolling to B sides with safe technique. Refused further mobility efforts due to anxiety and pain.    Transfers                   General transfer comment: refused further mobility    Ambulation/Gait                  Stairs            Wheelchair Mobility    Modified Rankin (Stroke Patients Only)       Balance Overall balance assessment: No apparent balance deficits (not formally assessed)                                           Pertinent Vitals/Pain Pain Assessment: 0-10 Pain Score: 2  Pain Location: low back Pain Descriptors / Indicators: Aching Pain Intervention(s): Limited activity within patient's tolerance    Home Living Family/patient expects to be discharged to::  (motel)                   Additional Comments: is currently house hunting and wants to find a home with a ramp    Prior Function Prior Level of Function : Needs assist       Physical Assist : Mobility (physical);ADLs (physical) Mobility (physical): Gait ADLs (physical): IADLs Mobility Comments: performs transfers using office chair and rolls to the bathroom. Reports he hasn't walked in a year due to  anticipate pain in back. He states he would be able to walk if necessary, however he chooses not to walk. ADLs Comments: wife assist with all IADLs     Hand Dominance        Extremity/Trunk Assessment   Upper Extremity Assessment Upper Extremity Assessment: Overall WFL for tasks assessed    Lower Extremity Assessment Lower Extremity Assessment: Overall WFL for tasks assessed       Communication   Communication: No difficulties  Cognition Arousal/Alertness: Awake/alert Behavior During Therapy: WFL for tasks assessed/performed Overall Cognitive Status: Within Functional Limits for tasks assessed                                  General Comments: self limiting        General Comments      Exercises     Assessment/Plan    PT Assessment Patient needs continued PT services  PT Problem List Decreased mobility;Pain       PT Treatment Interventions DME instruction;Gait training;Therapeutic exercise;Balance training    PT Goals (Current goals can be found in the Care Plan section)  Acute Rehab PT Goals Patient Stated Goal: to get a wheelchair PT Goal Formulation: With patient Time For Goal Achievement: 08/18/21 Potential to Achieve Goals: Good    Frequency Min 2X/week   Barriers to discharge        Co-evaluation               AM-PAC PT "6 Clicks" Mobility  Outcome Measure Help needed turning from your back to your side while in a flat bed without using bedrails?: None Help needed moving from lying on your back to sitting on the side of a flat bed without using bedrails?: A Little Help needed moving to and from a bed to a chair (including a wheelchair)?: A Little Help needed standing up from a chair using your arms (e.g., wheelchair or bedside chair)?: A Little Help needed to walk in hospital room?: A Little Help needed climbing 3-5 steps with a railing? : A Lot 6 Click Score: 18    End of Session   Activity Tolerance: Patient tolerated treatment well Patient left: in bed;with family/visitor present Nurse Communication: Mobility status PT Visit Diagnosis: Difficulty in walking, not elsewhere classified (R26.2);Pain Pain - Right/Left:  (central) Pain - part of body:  (back)    Time: 4628-6381 PT Time Calculation (min) (ACUTE ONLY): 27 min   Charges:   PT Evaluation $PT Eval Low Complexity: Harrells, PT, DPT [phone]   Annabella Elford 08/04/2021, 4:34 PM

## 2021-08-04 NOTE — Evaluation (Signed)
Occupational Therapy Evaluation Patient Details Name: Todd RICKE Sr. MRN: [id number] DOB: Jun 08, 1941 Today's Date: 08/04/2021   History of Present Illness Pt admitted for dehydration with complaints of cough. History includes HTN, COPD, GERD, RA, prostate ca, and pacemaker. Pt also positive for covid at this time.   Clinical Impression   Pt seen for OT evaluation this date. Prior to admission, pt was residing in a motel with wife. At baseline, pt performs dressing & spongebathing while seated EOB following set-up assist from wife, transfers to/from office chair independently, and rolls to the bathroom via office chair (pt reports he hasn't walked in a year due to the anticipation of back pain associated with RA). Wife assist with all IADLs. Pt currently presents with decreased AROM of b/l shoulders, decreased activity tolerance, and decreased balance. Due to these functional impairments, pt requires MIN A for bed-level bathing, SUPERVISION for bed mobility, SUPERVISION/SET-UP for seated UB dressing, and MOD A for sit<>stand transfers with 1-person HHA. Pt was unable to stand for more than 10sec and was unable take side steps this date d/t decreased strength and activity tolerance. Pt would benefit from additional skilled OT services to maximize return to PLOF and minimize risk of future falls, injury, caregiver burden, and readmission. Upon discharge, recommend SNF.        Recommendations for follow up therapy are one component of a multi-disciplinary discharge planning process, led by the attending physician.  Recommendations may be updated based on patient status, additional functional criteria and insurance authorization.   Follow Up Recommendations  Skilled nursing-short term rehab (<3 hours/day)    Assistance Recommended at Discharge Frequent or constant Supervision/Assistance  Functional Status Assessment  Patient has had a recent decline in their functional status and demonstrates the  ability to make significant improvements in function in a reasonable and predictable amount of time.  Equipment Recommendations  Other (comment) (defer to next venue of care)       Precautions / Restrictions Precautions Precautions: Fall Restrictions Weight Bearing Restrictions: No      Mobility Bed Mobility Overal bed mobility: Needs Assistance Bed Mobility: Supine to Sit;Sit to Supine     Supine to sit: Supervision Sit to supine: Supervision   General bed mobility comments: SUPERVISION d/t dizziness following supine>sit transfer.    Transfers Overall transfer level: Needs assistance Equipment used: 1 person hand held assist Transfers: Sit to/from Stand Sit to Stand: Mod assist           General transfer comment: x2 bouts. Requires MOD A for steadying when upright d/t posterior lean      Balance Overall balance assessment: Needs assistance Sitting-balance support: No upper extremity supported;Feet supported Sitting balance-Leahy Scale: Fair Sitting balance - Comments: requires supervision while reaching within BOS at EOB   Standing balance support: Single extremity supported;During functional activity Standing balance-Leahy Scale: Poor Standing balance comment: Requires MOD A to maintain static standing balance for ~10sec                           ADL either performed or assessed with clinical judgement   ADL Overall ADL's : Needs assistance/impaired     Grooming: Wash/dry face;Set up;Bed level   Upper Body Bathing: Minimal assistance;Bed level Upper Body Bathing Details (indicate cue type and reason): Requires 1-2 rest breaks throughout Lower Body Bathing: Minimal assistance;Bed level Lower Body Bathing Details (indicate cue type and reason): Requires 1-2 rest breaks throughout. MIN A for washing back  of thighs Upper Body Dressing : Supervision/safety;Set up;Sitting Upper Body Dressing Details (indicate cue type and reason): to don hospital gown  while sitting EOB Lower Body Dressing: Set up;Bed level Lower Body Dressing Details (indicate cue type and reason): to don/doff socks. Requires increased time/effort, however no physical assist required                     Vision Patient Visual Report: No change from baseline              Pertinent Vitals/Pain Pain Assessment: 0-10 Pain Score: 2  Pain Location: low back Pain Descriptors / Indicators: Aching Pain Intervention(s): Limited activity within patient's tolerance;Monitored during session        Extremity/Trunk Assessment Upper Extremity Assessment Upper Extremity Assessment: RUE deficits/detail;LUE deficits/detail RUE Deficits / Details: AROM shoulder flexion <30 degrees. Strength grossly at least 3+/5 in all other movements LUE Deficits / Details: AROM shoulder flexion <30 degrees. Strength grossly at least 3+/5 in all other movements   Lower Extremity Assessment Lower Extremity Assessment: Generalized weakness       Communication Communication Communication: No difficulties   Cognition Arousal/Alertness: Awake/alert Behavior During Therapy: WFL for tasks assessed/performed Overall Cognitive Status: Within Functional Limits for tasks assessed                                 General Comments: A&Ox3.     General Comments  HR 70-80bpm throughout            Home Living Family/patient expects to be discharged to:: Other (Comment) (motel)                                 Additional Comments: is currently house hunting and wants to find a home with a ramp      Prior Functioning/Environment Prior Level of Function : Needs assist       Physical Assist : Mobility (physical);ADLs (physical) Mobility (physical): Gait ADLs (physical): IADLs Mobility Comments: Pt performs transfers using office chair and rolls to the bathroom. Reports he hasn't walked in a year due to anticipate pain in back. He states he would be able to  walk if necessary, however he chooses not to walk. ADLs Comments: At baseline, pt performs dressing & spongebathing while seated EOB following set-up assist from wife. Wife assist with all IADLs        OT Problem List: Decreased activity tolerance;Decreased range of motion;Impaired balance (sitting and/or standing);Decreased knowledge of precautions;Pain      OT Treatment/Interventions: Self-care/ADL training;Therapeutic exercise;Energy conservation;Therapeutic activities;Patient/family education;Balance training    OT Goals(Current goals can be found in the care plan section) Acute Rehab OT Goals Patient Stated Goal: to prevent being in pain OT Goal Formulation: With patient Time For Goal Achievement: 08/18/21 Potential to Achieve Goals: Good ADL Goals Pt Will Perform Upper Body Bathing: with set-up;with supervision;sitting Pt Will Perform Lower Body Bathing: with set-up;with supervision;sitting/lateral leans Pt Will Transfer to Toilet: with min assist;stand pivot transfer;bedside commode  OT Frequency: Min 2X/week    AM-PAC OT "6 Clicks" Daily Activity     Outcome Measure Help from another person eating meals?: None Help from another person taking care of personal grooming?: A Little Help from another person toileting, which includes using toliet, bedpan, or urinal?: A Lot Help from another person bathing (including washing, rinsing, drying)?: A Little Help  from another person to put on and taking off regular upper body clothing?: A Little Help from another person to put on and taking off regular lower body clothing?: A Little 6 Click Score: 18   End of Session Nurse Communication: Mobility status  Activity Tolerance: Patient tolerated treatment well Patient left: in bed;with call bell/phone within reach;with bed alarm set;with family/visitor present  OT Visit Diagnosis: Unsteadiness on feet (R26.81)                Time: 8333-8329 OT Time Calculation (min): 42 min Charges:   OT General Charges $OT Visit: 1 Visit OT Evaluation $OT Eval Moderate Complexity: 1 Mod OT Treatments $Self Care/Home Management : 23-37 mins  Fredirick Maudlin, OTR/L [place]

## 2021-08-04 NOTE — Progress Notes (Signed)
SUBJECTIVE: Todd BANGHART Sr. is a 81 y.o. male with medical history significant of hypertension, COPD, GERD, prostate cancer, and complete heart block status post pacemaker . By report he has had increased SOB/DOE, weakness-limited mobility and cough for the past 4-5 days. Due to his symptoms he presents to [facility] for evaluation. He denied N/V,D, urinary symptoms, sore throat. He does continue to smoke.  No new complaints. Denies chest pain. Shortness of breath unchanged.    Vitals:   08/04/21 0427 08/04/21 0517 08/04/21 0550 08/04/21 0756  BP: 100/82 114/66  116/62  Pulse: 91 69  70  Resp: 14 (!) 21  20  Temp: 98.7 F (37.1 C) 98.1 F (36.7 C)  98 F (36.7 C)  TempSrc:    Oral  SpO2: 99% 98%  98%  Weight:   60.9 kg   Height:        Intake/Output Summary (Last 24 hours) at 08/04/2021 0857 Last data filed at 08/03/2021 1900 Gross per 24 hour  Intake 500 ml  Output --  Net 500 ml    LABS: Basic Metabolic Panel: Recent Labs    08/02/21 0555 08/03/21 0628  NA 132* 129*  K 4.1 4.3  CL 99 100  CO2 26 23  GLUCOSE 112* 111*  BUN 21 21  CREATININE 0.64 0.60*  CALCIUM 8.3* 8.2*  MG 1.7  --   PHOS 3.2  --    Liver Function Tests: Recent Labs    08/02/21 0555 08/03/21 0628  AST 23 32  ALT 13 23  ALKPHOS 60 54  BILITOT 0.5 0.4  PROT 5.6* 5.3*  ALBUMIN 3.1* 2.9*   No results for input(s): LIPASE, AMYLASE in the last 72 hours. CBC: Recent Labs    08/02/21 0555 08/03/21 0628  WBC 4.4 4.8  NEUTROABS  --  3.3  HGB 10.2* 10.2*  HCT 30.7* 29.8*  MCV 99.0 97.4  PLT 150 150   Cardiac Enzymes: No results for input(s): CKTOTAL, CKMB, CKMBINDEX, TROPONINI in the last 72 hours. BNP: Invalid input(s): POCBNP D-Dimer: No results for input(s): DDIMER in the last 72 hours. Hemoglobin A1C: No results for input(s): HGBA1C in the last 72 hours. Fasting Lipid Panel: No results for input(s): CHOL, HDL, LDLCALC, TRIG, CHOLHDL, LDLDIRECT in the last 72 hours. Thyroid Function  Tests: No results for input(s): TSH, T4TOTAL, T3FREE, THYROIDAB in the last 72 hours.  Invalid input(s): FREET3 Anemia Panel: No results for input(s): VITAMINB12, FOLATE, FERRITIN, TIBC, IRON, RETICCTPCT in the last 72 hours.   PHYSICAL EXAM General: Well developed, well nourished, in no acute distress HEENT:  Normocephalic and atramatic Neck:  No JVD.  Lungs: Clear bilaterally to auscultation and percussion. Heart: HRRR . Normal S1 and S2 without gallops or murmurs.  Abdomen: Bowel sounds are positive, abdomen soft and non-tender  Msk:  Back normal, normal gait. Normal strength and tone for age. Extremities: No clubbing, cyanosis or edema.   Neuro: Alert and oriented X 3. Psych:  Good affect, responds appropriately  TELEMETRY: ventricular paced rhythm, HR 70 bpm  ASSESSMENT AND PLAN: Patient vitals stable. Denies chest pain. Shortness of breath unchanged. Covid positive on 07/31/21. Receiving remdesivir.   Bacteremia secondary to Staphylococcus No recent injury/skin source, only device and that he has pacemaker that was placed January 2021 Blood culture X4 positive-no urine culture collected on admission. Started on Vancomycin.  Now on ampicillin. Repeat blood cultures pending.   Recommend TEE, however, patient is Covid positive at this time. Will continue to follow.  Principal Problem:   Dehydration, moderate Active Problems:   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HCC)   Acute dehydration   COVID-19 virus infection   Emphysema lung (HCC)   Acute on chronic combined systolic and diastolic CHF (congestive heart failure) (HCC)    Marquelle Musgrave, FNP-C 08/04/2021 8:57 AM

## 2021-08-04 NOTE — Progress Notes (Signed)
PROGRESS NOTE    Todd Webster  MBW:[id number] DOB: 03/25/1941 DOA: 07/31/2021 PCP: Todd Marble, MD   Brief Narrative: Taken from prior notes. Todd Webster Sr. is a 81 y.o. male with a PMH significant for HTN, COPD, GERD, prostate cancer, complete heart block s/p pacemaker. They presented from home to the ED on 07/31/2021 with SOB x5 days. In the ED, it was found that they had COVID. Additionally, he had hypotension which responded to IV fluid boluses, febrile, AMS. They were treated with supplemental oxygen, remdesivir, and steroid initiation.  Blood cultures, 4 out of 4 bottles came back positive for staph Capitis, pretty pansensitive. He was initially started on vancomycin which was transitioned to cefazolin. Repeat echocardiogram with normalization of EF and no vegetations. Cardiology was consulted for TEE, looks like no plan yet due to positive COVID status. Will ask ID tomorrow regarding the duration of antibiotics as there is no TEE. Made ibuprofen and tizanidine as as needed.  Subjective: Patient was seen and examined today.  He was concerned that he was getting more medications than he was supposed to get it.  Assessment & Plan:   Principal Problem:   Dehydration, moderate Active Problems:   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HCC)   Acute dehydration   COVID-19 virus infection   Emphysema lung (HCC)   Acute on chronic combined systolic and diastolic CHF (congestive heart failure) ([place])  COVID-19 infection.  Remained on room air. -Continue with remdesivir-day 5-we will complete the course today -Stop steroid. -Continue with supportive care and supplements  Staph Capitis bacteremia.  4 out of 4 positive blood culture bottles.  Pansensitive  High risk for endocarditis due to pacemaker in place.  Echocardiogram/TTE with normalization of EF and no valvular abnormality noted.  Will need TEE due to his risk. Looks like cardiology would like to defer due to positive COVID  status. -Switch vancomycin with cefazolin -ID consult-for proper duration of antibiotics as there is no TEE -Repeat blood cultures-pending  History of HFrEF.  Appears euvolemic.  Normalization of EF on repeat TTE. -Continue to monitor  Hypertension.  Blood pressure seems within goal. Home antihypertensives were held due to softer blood pressure. -Continue to monitor  Hyponatremia.  At 130 today.  Clinically appears euvolemic.  Did received IV fluid -Check hyponatremia labs-no urine studies done yet, serum osmolality within normal limit.  Third-degree heart block S/P pacemaker in place.  COPD.  No concern of exacerbation -Continue home bronchodilators  Objective: Vitals:   08/04/21 0517 08/04/21 0550 08/04/21 0756 08/04/21 1115  BP: 114/66  116/62 134/62  Pulse: 69  70 69  Resp: (!) 21  20 17   Temp: 98.1 F (36.7 C)  98 F (36.7 C) 98 F (36.7 C)  TempSrc:   Oral   SpO2: 98%  98% 99%  Weight:  60.9 kg    Height:        Intake/Output Summary (Last 24 hours) at 08/04/2021 1507 Last data filed at 08/03/2021 1900 Gross per 24 hour  Intake 500 ml  Output --  Net 500 ml    Filed Weights   08/03/21 0428 08/04/21 0409 08/04/21 0550  Weight: 58.4 kg 60.8 kg 60.9 kg    Examination:  General.  Frail elderly man, in no acute distress. Pulmonary.  Lungs clear bilaterally, normal respiratory effort. CV.  Regular rate and rhythm, no JVD, rub or murmur. Abdomen.  Soft, nontender, nondistended, BS positive. CNS.  Alert and oriented .  No focal neurologic  deficit. Extremities.  No edema, no cyanosis, pulses intact and symmetrical. Psychiatry.  Judgment and insight appears normal.   DVT prophylaxis: Lovenox Code Status: Full Family Communication:  Disposition Plan:  Status is: Inpatient  Remains inpatient appropriate because: Severity of illness   Level of care: Progressive  All the records are reviewed and case discussed with Care Management/Social Worker. Management  plans discussed with the patient, nursing and they are in agreement.  Consultants:  PCCM Cardiology  Procedures:  Antimicrobials:  Cefazolin  Data Reviewed: I have personally reviewed following labs and imaging studies  CBC: Recent Labs  Lab 07/31/21 0413 08/01/21 0658 08/02/21 0555 08/03/21 0628  WBC 6.0 3.8* 4.4 4.8  NEUTROABS 3.7 2.5  --  3.3  HGB 11.4* 11.1* 10.2* 10.2*  HCT 33.9* 32.5* 30.7* 29.8*  MCV 100.0 99.1 99.0 97.4  PLT 193 142* 150 735    Basic Metabolic Panel: Recent Labs  Lab 07/31/21 0413 08/01/21 0658 08/02/21 0555 08/03/21 0628 08/04/21 0950  NA 134* 137 132* 129* 130*  K 3.9 4.1 4.1 4.3 3.8  CL 104 102 99 100 100  CO2 24 26 26 23 25   GLUCOSE 98 107* 112* 111* 134*  BUN 19 20 21 21 21   CREATININE 0.82 0.67 0.64 0.60* 0.52*  CALCIUM 8.9 8.7* 8.3* 8.2* 8.0*  MG  --   --  1.7  --   --   PHOS  --   --  3.2  --   --     GFR: Estimated Creatinine Clearance: 64.5 mL/min (A) (by C-G formula based on SCr of 0.52 mg/dL (L)). Liver Function Tests: Recent Labs  Lab 07/31/21 0413 07/31/21 0650 08/02/21 0555 08/03/21 0628  AST 20  --  23 32  ALT 12  --  13 23  ALKPHOS 81  --  60 54  BILITOT 0.7  --  0.5 0.4  PROT 7.2 6.8 5.6* 5.3*  ALBUMIN 4.0  --  3.1* 2.9*    No results for input(s): LIPASE, AMYLASE in the last 168 hours. No results for input(s): AMMONIA in the last 168 hours. Coagulation Profile: Recent Labs  Lab 07/31/21 1330  INR 1.3*    Cardiac Enzymes: No results for input(s): CKTOTAL, CKMB, CKMBINDEX, TROPONINI in the last 168 hours. BNP (last 3 results) No results for input(s): PROBNP in the last 8760 hours. HbA1C: No results for input(s): HGBA1C in the last 72 hours. CBG: Recent Labs  Lab 08/03/21 1129 08/03/21 1636  GLUCAP 150* 163*    Lipid Profile: No results for input(s): CHOL, HDL, LDLCALC, TRIG, CHOLHDL, LDLDIRECT in the last 72 hours. Thyroid Function Tests: No results for input(s): TSH, T4TOTAL, FREET4,  T3FREE, THYROIDAB in the last 72 hours. Anemia Panel: No results for input(s): VITAMINB12, FOLATE, FERRITIN, TIBC, IRON, RETICCTPCT in the last 72 hours. Sepsis Labs: Recent Labs  Lab 07/31/21 0650 07/31/21 1113 08/01/21 1117  PROCALCITON <0.10  --   --   LATICACIDVEN  --  0.9 1.9     Recent Results (from the past 240 hour(s))  Resp Panel by RT-PCR (Flu A&B, Covid) Nasopharyngeal Swab     Status: Abnormal   Collection Time: 07/31/21  4:20 AM   Specimen: Nasopharyngeal Swab; Nasopharyngeal(NP) swabs in vial transport medium  Result Value Ref Range Status   SARS Coronavirus 2 by RT PCR POSITIVE (A) NEGATIVE Final    Comment: (NOTE) SARS-CoV-2 target nucleic acids are DETECTED.  The SARS-CoV-2 RNA is generally detectable in upper respiratory specimens during the acute  phase of infection. Positive results are indicative of the presence of the identified virus, but do not rule out bacterial infection or co-infection with other pathogens not detected by the test. Clinical correlation with patient history and other diagnostic information is necessary to determine patient infection status. The expected result is Negative.  Fact Sheet for Patients: EntrepreneurPulse.com.au  Fact Sheet for Healthcare Providers: IncredibleEmployment.be  This test is not yet approved or cleared by the [place] FDA and  has been authorized for detection and/or diagnosis of SARS-CoV-2 by FDA under an Emergency Use Authorization (EUA).  This EUA will remain in effect (meaning this test can be used) for the duration of  the COVID-19 declaration under Section 564(b)(1) of the A ct, 21 U.S.C. section 360bbb-3(b)(1), unless the authorization is terminated or revoked sooner.     Influenza A by PCR NEGATIVE NEGATIVE Final   Influenza B by PCR NEGATIVE NEGATIVE Final    Comment: (NOTE) The Xpert Xpress SARS-CoV-2/FLU/RSV plus assay is intended as an aid in the  diagnosis of influenza from Nasopharyngeal swab specimens and should not be used as a sole basis for treatment. Nasal washings and aspirates are unacceptable for Xpert Xpress SARS-CoV-2/FLU/RSV testing.  Fact Sheet for Patients: EntrepreneurPulse.com.au  Fact Sheet for Healthcare Providers: IncredibleEmployment.be  This test is not yet approved or cleared by the [place] FDA and has been authorized for detection and/or diagnosis of SARS-CoV-2 by FDA under an Emergency Use Authorization (EUA). This EUA will remain in effect (meaning this test can be used) for the duration of the COVID-19 declaration under Section 564(b)(1) of the Act, 21 U.S.C. section 360bbb-3(b)(1), unless the authorization is terminated or revoked.  Performed at [facility], [address] [place], [place] [address]   Blood Culture (routine x 2)     Status: Abnormal   Collection Time: 07/31/21  1:24 PM   Specimen: BLOOD  Result Value Ref Range Status   Specimen Description   Final    BLOOD RIGHT ANTECUBITAL Performed at [facility], [address], [place], [place] [address]    Special Requests   Final    BOTTLES DRAWN AEROBIC AND ANAEROBIC Blood Culture adequate volume Performed at [facility], [address] [place], [place] [address]    Culture  Setup Time   Final    GRAM POSITIVE COCCI IN BOTH AEROBIC AND ANAEROBIC BOTTLES Gram Stain Report Called to,Read Back By and Verified With: LISA KLUTTZ 08/01/21 @ 0838 BY SB Performed at [facility], [address] [place], [place] [address]    Culture (A)  Final    STAPHYLOCOCCUS CAPITIS SUSCEPTIBILITIES PERFORMED ON PREVIOUS CULTURE WITHIN THE LAST 5 DAYS. Performed at [facility], [place] [address], [place], [place] [address]    Report Status 08/03/2021 FINAL  Final  Blood Culture (routine x 2)     Status: Abnormal   Collection Time: 07/31/21  1:25 PM   Specimen:  BLOOD  Result Value Ref Range Status   Specimen Description   Final    BLOOD BLOOD RIGHT HAND Performed at [facility], [address], [place], [place] [address]    Special Requests   Final    BOTTLES DRAWN AEROBIC AND ANAEROBIC Blood Culture adequate volume Performed at [facility], [address], [place], [place] [address]    Culture  Setup Time   Final    GRAM POSITIVE COCCI IN BOTH AEROBIC AND ANAEROBIC BOTTLES Gram Stain Report Called to,Read Back  By and Verified With: LISA KLUTTZ 08/01/21 @ 4098 BY SB Performed at [facility], [place] [address] [place], [place] [address]    Culture STAPHYLOCOCCUS CAPITIS (A)  Final   Report Status 08/03/2021 FINAL  Final   Organism ID, Bacteria STAPHYLOCOCCUS CAPITIS  Final      Susceptibility   Staphylococcus capitis - MIC*    CIPROFLOXACIN <=0.5 SENSITIVE Sensitive     ERYTHROMYCIN <=0.25 SENSITIVE Sensitive     GENTAMICIN <=0.5 SENSITIVE Sensitive     OXACILLIN <=0.25 SENSITIVE Sensitive     TETRACYCLINE <=1 SENSITIVE Sensitive     VANCOMYCIN <=0.5 SENSITIVE Sensitive     TRIMETH/SULFA <=10 SENSITIVE Sensitive     CLINDAMYCIN <=0.25 SENSITIVE Sensitive     RIFAMPIN <=0.5 SENSITIVE Sensitive     Inducible Clindamycin NEGATIVE Sensitive     * STAPHYLOCOCCUS CAPITIS  Blood Culture ID Panel (Reflexed)     Status: Abnormal   Collection Time: 07/31/21  1:25 PM  Result Value Ref Range Status   Enterococcus faecalis NOT DETECTED NOT DETECTED Final   Enterococcus Faecium NOT DETECTED NOT DETECTED Final   Listeria monocytogenes NOT DETECTED NOT DETECTED Final   Staphylococcus species DETECTED (A) NOT DETECTED Final    Comment: CRITICAL RESULT CALLED TO, READ BACK BY AND VERIFIED WITH: LISA KLUTTZ 08/01/21 @ 0838 BY SB    Staphylococcus aureus (BCID) NOT DETECTED NOT DETECTED Final   Staphylococcus epidermidis NOT DETECTED NOT DETECTED Final   Staphylococcus lugdunensis NOT DETECTED NOT DETECTED Final    Streptococcus species NOT DETECTED NOT DETECTED Final   Streptococcus agalactiae NOT DETECTED NOT DETECTED Final   Streptococcus pneumoniae NOT DETECTED NOT DETECTED Final   Streptococcus pyogenes NOT DETECTED NOT DETECTED Final   A.calcoaceticus-baumannii NOT DETECTED NOT DETECTED Final   Bacteroides fragilis NOT DETECTED NOT DETECTED Final   Enterobacterales NOT DETECTED NOT DETECTED Final   Enterobacter cloacae complex NOT DETECTED NOT DETECTED Final   Escherichia coli NOT DETECTED NOT DETECTED Final   Klebsiella aerogenes NOT DETECTED NOT DETECTED Final   Klebsiella oxytoca NOT DETECTED NOT DETECTED Final   Klebsiella pneumoniae NOT DETECTED NOT DETECTED Final   Proteus species NOT DETECTED NOT DETECTED Final   Salmonella species NOT DETECTED NOT DETECTED Final   Serratia marcescens NOT DETECTED NOT DETECTED Final   Haemophilus influenzae NOT DETECTED NOT DETECTED Final   Neisseria meningitidis NOT DETECTED NOT DETECTED Final   Pseudomonas aeruginosa NOT DETECTED NOT DETECTED Final   Stenotrophomonas maltophilia NOT DETECTED NOT DETECTED Final   Candida albicans NOT DETECTED NOT DETECTED Final   Candida auris NOT DETECTED NOT DETECTED Final   Candida glabrata NOT DETECTED NOT DETECTED Final   Candida krusei NOT DETECTED NOT DETECTED Final   Candida parapsilosis NOT DETECTED NOT DETECTED Final   Candida tropicalis NOT DETECTED NOT DETECTED Final   Cryptococcus neoformans/gattii NOT DETECTED NOT DETECTED Final    Comment: Performed at [facility], [address] [place], [place] [address]      Radiology Studies: No results found.  Scheduled Meds:  atorvastatin  10 mg Oral Daily   brinzolamide  1 drop Both Eyes BID AC & HS   And   brimonidine  1 drop Both Eyes BID AC & HS   busPIRone  5 mg Oral BID   enoxaparin (LOVENOX) injection  40 mg Subcutaneous Q24H   feeding supplement  237 mL Oral TID BM   Ipratropium-Albuterol  1 puff Inhalation QID   latanoprost  1  drop  Left Eye QHS   multivitamin with minerals  1 tablet Oral Daily   pantoprazole  40 mg Oral Daily   senna  1 tablet Oral BID   tamsulosin  0.4 mg Oral Daily   umeclidinium-vilanterol  1 puff Inhalation Daily   Continuous Infusions:   ceFAZolin (ANCEF) IV 2 g (08/04/21 1311)     LOS: 3 days   Time spent: 40 minutes. More than 50% of the time was spent in counseling/coordination of care  Lorella Nimrod, MD [facility]  If 7PM-7AM, please contact night-coverage [email or web address]  08/04/2021, 3:07 PM   This record has been created using Systems analyst. Errors have been sought and corrected,but may not always be located. Such creation errors do not reflect on the standard of care.

## 2021-08-04 NOTE — Progress Notes (Signed)
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  Ongoing  GOAL:   Patient will meet greater than or equal to 90% of their needs  Progressing   MONITOR:   PO intake, Supplement acceptance, Labs, Weight trends, Skin, I & O's  REASON FOR ASSESSMENT:   Consult Assessment of nutrition requirement/status, Poor PO  ASSESSMENT:   Todd Webster. is a 81 y.o. male with medical history significant of hypertension, COPD, GERD, prostate cancer, and complete heart block status post pacemaker . By report he has had increased SOB/DOE, weakness-limited mobility and cough for the past 4-5 days. Due to his symptoms he presents to [facility]ED for evaluation. He denied N/V,D, urinary symptoms, sore throat. He does continue to smoke.  Reviewed I/O's: +500 ml x 24 hours and -549 ml since admission  Spoke with pt and wife at bedside. Pt reports he has a fair appetite, but is trying to eat as much as he can. He expresses concern that he is getting full from "too much fluid and medications". Pt reports consuming at least half od his meals- noted meal completions 0-100%. He is taking Ensure and [employer], which he likes.   Pt reports fair appetite at home. Typical routine is 57m small meals at morning and afternoon (fruit Or pack or crackers) and one large meal out, such as pizza. Pt wife explains that they are currently living in a motel, so food options are limited. Per pt wife, she is concerned that pt was not drinking  enough water, which is why he was dehydrated.   Pt shares that UBW is arounf 168#. He noticed a progressive weight loss over the past few years, but shares he has lost about 28# (from 145 to 117#) over the past few months. Reviewed wt hx; wt has been stable over the past few months.   Discussed importance of good meal and supplement intake to promote healing. Pt amenable to continue supplements.   Medications reviewed and include senokot.   Labs reviewed: Na: 130, CBGS: 150-163 (inpatient orders for glycemic control are none).    NUTRITION - FOCUSED PHYSICAL EXAM:  Flowsheet Row Most Recent Value  Orbital Region Mild depletion  Upper Arm Region Severe depletion  Thoracic and Lumbar Region Moderate depletion  Buccal Region Severe depletion  Temple Region Moderate depletion  Clavicle Bone Region Severe depletion  Clavicle and Acromion Bone Region Severe depletion  Scapular Bone Region Severe depletion  Dorsal Hand Severe depletion  Patellar Region Severe depletion  Anterior Thigh Region Severe depletion  Posterior Calf Region Severe depletion  Edema (RD Assessment) None  Hair Reviewed  Eyes Reviewed  Mouth Reviewed  Skin Reviewed  Nails Reviewed       Diet Order:   Diet Order             Diet regular Room service appropriate? Yes with Assist; Fluid consistency: Thin  Diet effective now                   EDUCATION NEEDS:   Education needs have  been addressed  Skin:  Skin Assessment: Reviewed RN Assessment  Last BM:  08/02/21  Height:   Ht Readings from Last 1 Encounters:  07/31/21 6' (1.829 m)    Weight:   Wt Readings from Last 1 Encounters:  08/04/21 60.9 kg    Ideal Body Weight:  80.9 kg  BMI:  Body mass index is 18.21 kg/m.  Estimated Nutritional Needs:   Kcal:  2150-2350  Protein:  105-120 grams  Fluid:  > 2 L    Loistine Chance, RD, LDN, [place] Registered Dietitian II Certified Diabetes Care and Education Specialist Please refer to  [facility] for RD and/or RD on-call/weekend/after hours pager

## 2021-08-05 DIAGNOSIS — R7881 Bacteremia: Secondary | ICD-10-CM

## 2021-08-05 DIAGNOSIS — B957 Other staphylococcus as the cause of diseases classified elsewhere: Secondary | ICD-10-CM

## 2021-08-05 DIAGNOSIS — J439 Emphysema, unspecified: Secondary | ICD-10-CM | POA: Diagnosis not present

## 2021-08-05 DIAGNOSIS — U071 COVID-19: Principal | ICD-10-CM

## 2021-08-05 LAB — BASIC METABOLIC PANEL
Anion gap: 4 — ABNORMAL LOW (ref 5–15)
BUN: 22 mg/dL (ref 8–23)
CO2: 25 mmol/L (ref 22–32)
Calcium: 7.8 mg/dL — ABNORMAL LOW (ref 8.9–10.3)
Chloride: 101 mmol/L (ref 98–111)
Creatinine, Ser: 0.52 mg/dL — ABNORMAL LOW (ref 0.61–1.24)
GFR, Estimated: >60 mL/min (ref >60)
Glucose, Bld: 117 mg/dL — ABNORMAL HIGH (ref 70–99)
Potassium: 4.2 mmol/L (ref 3.5–5.1)
Sodium: 130 mmol/L — ABNORMAL LOW (ref 135–145)

## 2021-08-05 LAB — SODIUM, URINE, RANDOM: Sodium, Ur: 81 mmol/L

## 2021-08-05 NOTE — Progress Notes (Signed)
PROGRESS NOTE    Todd Webster  ZJQ:[id number] DOB: April 21, 1941 DOA: 07/31/2021 PCP: Jodi Marble, MD   Brief Narrative: Taken from prior notes. Todd Kocher Sr. is a 81 y.o. male with a PMH significant for HTN, COPD, GERD, prostate cancer, complete heart block s/p pacemaker. They presented from home to the ED on 07/31/2021 with SOB x5 days. In the ED, it was found that they had COVID. Additionally, he had hypotension which responded to IV fluid boluses, febrile, AMS. They were treated with supplemental oxygen, remdesivir, and steroid initiation.  Blood cultures, 4 out of 4 bottles came back positive for staph Capitis, pretty pansensitive. He was initially started on vancomycin which was transitioned to cefazolin. Repeat echocardiogram with normalization of EF and no vegetations. Cardiology was consulted for TEE, looks like no plan yet due to positive COVID status. I asked ID today to help with duration and type of antibiotic as unable to get TEE, they will put their recommendations later today. PT is recommending home health and OT is recommending SNF, patient himself wants to go home with home health.  TOC to coordinate with family.  Subjective: Patient was seen and examined today.  No new complaints.  Assessment & Plan:   Principal Problem:   Dehydration, moderate Active Problems:   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HCC)   Acute dehydration   COVID-19 virus infection   Emphysema lung (HCC)   Acute on chronic combined systolic and diastolic CHF (congestive heart failure) ([place])  COVID-19 infection.  Remained on room air.  Completed a 5-day course of remdesivir and steroids -Continue with supportive care and supplements  Staph Capitis bacteremia.  4 out of 4 positive blood culture bottles.  Pansensitive  High risk for endocarditis due to pacemaker in place.  Echocardiogram/TTE with normalization of EF and no valvular abnormality noted.  Will need TEE due to his risk. Looks like  cardiology would like to defer due to positive COVID status. -Vancomycin was switched with cefazolin -ID consult-for proper duration of antibiotics as there is no TEE -Repeat blood cultures-negative so far  History of HFrEF.  Appears euvolemic.  Normalization of EF on repeat TTE. -Continue to monitor  Hypertension.  Blood pressure seems within goal. Home antihypertensives were held due to softer blood pressure. -Continue to monitor  Hyponatremia.  At 130 today.  Clinically appears euvolemic.  Did received IV fluid -Elevated urinary sodium, normal osmolality. -Continue to monitor  Third-degree heart block S/P pacemaker in place.  COPD.  No concern of exacerbation -Continue home bronchodilators  Severe protein caloric malnutrition. Estimated body mass index is 17.76 kg/m as calculated from the following:   Height as of this encounter: 6' (1.829 m).   Weight as of this encounter: 59.4 kg.  -Dietitian consult  Objective: Vitals:   08/05/21 0441 08/05/21 0534 08/05/21 0817 08/05/21 1214  BP: 119/61  122/63 127/69  Pulse: 70  74 79  Resp: 18  18 18   Temp: 98.1 F (36.7 C)  98.3 F (36.8 C) 98.2 F (36.8 C)  TempSrc:   Oral Oral  SpO2: 99%  98% 98%  Weight:  59.4 kg    Height:        Intake/Output Summary (Last 24 hours) at 08/05/2021 1457 Last data filed at 08/05/2021 1359 Gross per 24 hour  Intake 550 ml  Output 2600 ml  Net -2050 ml    Filed Weights   08/04/21 0409 08/04/21 0550 08/05/21 0534  Weight: 60.8 kg 60.9 kg 59.4  kg    Examination:  General.  Malnourished elderly man, in no acute distress. Pulmonary.  Lungs clear bilaterally, normal respiratory effort. CV.  Regular rate and rhythm, no JVD, rub or murmur. Abdomen.  Soft, nontender, nondistended, BS positive. CNS.  Alert and oriented .  No focal neurologic deficit. Extremities.  No edema, no cyanosis, pulses intact and symmetrical. Psychiatry.  Judgment and insight appears normal.   DVT prophylaxis:  Lovenox Code Status: Full Family Communication: Called wife with no response Disposition Plan:  Status is: Inpatient  Remains inpatient appropriate because: Severity of illness   Level of care: Progressive  All the records are reviewed and case discussed with Care Management/Social Worker. Management plans discussed with the patient, nursing and they are in agreement.  Consultants:  PCCM Cardiology  Procedures:  Antimicrobials:  Cefazolin  Data Reviewed: I have personally reviewed following labs and imaging studies  CBC: Recent Labs  Lab 07/31/21 0413 08/01/21 0658 08/02/21 0555 08/03/21 0628  WBC 6.0 3.8* 4.4 4.8  NEUTROABS 3.7 2.5  --  3.3  HGB 11.4* 11.1* 10.2* 10.2*  HCT 33.9* 32.5* 30.7* 29.8*  MCV 100.0 99.1 99.0 97.4  PLT 193 142* 150 568    Basic Metabolic Panel: Recent Labs  Lab 08/01/21 0658 08/02/21 0555 08/03/21 0628 08/04/21 0950 08/05/21 0547  NA 137 132* 129* 130* 130*  K 4.1 4.1 4.3 3.8 4.2  CL 102 99 100 100 101  CO2 26 26 23 25 25   GLUCOSE 107* 112* 111* 134* 117*  BUN 20 21 21 21 22   CREATININE 0.67 0.64 0.60* 0.52* 0.52*  CALCIUM 8.7* 8.3* 8.2* 8.0* 7.8*  MG  --  1.7  --   --   --   PHOS  --  3.2  --   --   --     GFR: Estimated Creatinine Clearance: 62.9 mL/min (A) (by C-G formula based on SCr of 0.52 mg/dL (L)). Liver Function Tests: Recent Labs  Lab 07/31/21 0413 07/31/21 0650 08/02/21 0555 08/03/21 0628  AST 20  --  23 32  ALT 12  --  13 23  ALKPHOS 81  --  60 54  BILITOT 0.7  --  0.5 0.4  PROT 7.2 6.8 5.6* 5.3*  ALBUMIN 4.0  --  3.1* 2.9*    No results for input(s): LIPASE, AMYLASE in the last 168 hours. No results for input(s): AMMONIA in the last 168 hours. Coagulation Profile: Recent Labs  Lab 07/31/21 1330  INR 1.3*    Cardiac Enzymes: No results for input(s): CKTOTAL, CKMB, CKMBINDEX, TROPONINI in the last 168 hours. BNP (last 3 results) No results for input(s): PROBNP in the last 8760  hours. HbA1C: No results for input(s): HGBA1C in the last 72 hours. CBG: Recent Labs  Lab 08/03/21 1129 08/03/21 1636  GLUCAP 150* 163*    Lipid Profile: No results for input(s): CHOL, HDL, LDLCALC, TRIG, CHOLHDL, LDLDIRECT in the last 72 hours. Thyroid Function Tests: No results for input(s): TSH, T4TOTAL, FREET4, T3FREE, THYROIDAB in the last 72 hours. Anemia Panel: No results for input(s): VITAMINB12, FOLATE, FERRITIN, TIBC, IRON, RETICCTPCT in the last 72 hours. Sepsis Labs: Recent Labs  Lab 07/31/21 0650 07/31/21 1113 08/01/21 1117  PROCALCITON <0.10  --   --   LATICACIDVEN  --  0.9 1.9     Recent Results (from the past 240 hour(s))  Resp Panel by RT-PCR (Flu A&B, Covid) Nasopharyngeal Swab     Status: Abnormal   Collection Time: 07/31/21  4:20  AM   Specimen: Nasopharyngeal Swab; Nasopharyngeal(NP) swabs in vial transport medium  Result Value Ref Range Status   SARS Coronavirus 2 by RT PCR POSITIVE (A) NEGATIVE Final    Comment: (NOTE) SARS-CoV-2 target nucleic acids are DETECTED.  The SARS-CoV-2 RNA is generally detectable in upper respiratory specimens during the acute phase of infection. Positive results are indicative of the presence of the identified virus, but do not rule out bacterial infection or co-infection with other pathogens not detected by the test. Clinical correlation with patient history and other diagnostic information is necessary to determine patient infection status. The expected result is Negative.  Fact Sheet for Patients: EntrepreneurPulse.com.au  Fact Sheet for Healthcare Providers: IncredibleEmployment.be  This test is not yet approved or cleared by the [place] FDA and  has been authorized for detection and/or diagnosis of SARS-CoV-2 by FDA under an Emergency Use Authorization (EUA).  This EUA will remain in effect (meaning this test can be used) for the duration of  the COVID-19 declaration  under Section 564(b)(1) of the A ct, 21 U.S.C. section 360bbb-3(b)(1), unless the authorization is terminated or revoked sooner.     Influenza A by PCR NEGATIVE NEGATIVE Final   Influenza B by PCR NEGATIVE NEGATIVE Final    Comment: (NOTE) The Xpert Xpress SARS-CoV-2/FLU/RSV plus assay is intended as an aid in the diagnosis of influenza from Nasopharyngeal swab specimens and should not be used as a sole basis for treatment. Nasal washings and aspirates are unacceptable for Xpert Xpress SARS-CoV-2/FLU/RSV testing.  Fact Sheet for Patients: EntrepreneurPulse.com.au  Fact Sheet for Healthcare Providers: IncredibleEmployment.be  This test is not yet approved or cleared by the [place] FDA and has been authorized for detection and/or diagnosis of SARS-CoV-2 by FDA under an Emergency Use Authorization (EUA). This EUA will remain in effect (meaning this test can be used) for the duration of the COVID-19 declaration under Section 564(b)(1) of the Act, 21 U.S.C. section 360bbb-3(b)(1), unless the authorization is terminated or revoked.  Performed at [facility], [address] [place], [place] [address]   Blood Culture (routine x 2)     Status: Abnormal   Collection Time: 07/31/21  1:24 PM   Specimen: BLOOD  Result Value Ref Range Status   Specimen Description   Final    BLOOD RIGHT ANTECUBITAL Performed at [facility], [address], [place], [place] [address]    Special Requests   Final    BOTTLES DRAWN AEROBIC AND ANAEROBIC Blood Culture adequate volume Performed at [facility], [address] [place], [place] [address]    Culture  Setup Time   Final    GRAM POSITIVE COCCI IN BOTH AEROBIC AND ANAEROBIC BOTTLES Gram Stain Report Called to,Read Back By and Verified With: LISA KLUTTZ 08/01/21 @ 0838 BY SB Performed at [facility], [address] [place], [place] [address]    Culture (A)   Final    STAPHYLOCOCCUS CAPITIS SUSCEPTIBILITIES PERFORMED ON PREVIOUS CULTURE WITHIN THE LAST 5 DAYS. Performed at [facility], [place] [address], [place], [place] [address]    Report Status 08/03/2021 FINAL  Final  Blood Culture (routine x 2)     Status: Abnormal   Collection Time: 07/31/21  1:25 PM   Specimen: BLOOD  Result Value Ref Range Status   Specimen Description   Final    BLOOD BLOOD RIGHT HAND Performed at [facility], [address] [place], [place] [address]    Special Requests  Final    BOTTLES DRAWN AEROBIC AND ANAEROBIC Blood Culture adequate volume Performed at [facility], [address] [place], [place] [address]    Culture  Setup Time   Final    GRAM POSITIVE COCCI IN BOTH AEROBIC AND ANAEROBIC BOTTLES Gram Stain Report Called to,Read Back By and Verified With: LISA KLUTTZ 08/01/21 @ 0838 BY SB Performed at [facility], [place] [address], [place], [place] [address]    Culture STAPHYLOCOCCUS CAPITIS (A)  Final   Report Status 08/03/2021 FINAL  Final   Organism ID, Bacteria STAPHYLOCOCCUS CAPITIS  Final      Susceptibility   Staphylococcus capitis - MIC*    CIPROFLOXACIN <=0.5 SENSITIVE Sensitive     ERYTHROMYCIN <=0.25 SENSITIVE Sensitive     GENTAMICIN <=0.5 SENSITIVE Sensitive     OXACILLIN <=0.25 SENSITIVE Sensitive     TETRACYCLINE <=1 SENSITIVE Sensitive     VANCOMYCIN <=0.5 SENSITIVE Sensitive     TRIMETH/SULFA <=10 SENSITIVE Sensitive     CLINDAMYCIN <=0.25 SENSITIVE Sensitive     RIFAMPIN <=0.5 SENSITIVE Sensitive     Inducible Clindamycin NEGATIVE Sensitive     * STAPHYLOCOCCUS CAPITIS  Blood Culture ID Panel (Reflexed)     Status: Abnormal   Collection Time: 07/31/21  1:25 PM  Result Value Ref Range Status   Enterococcus faecalis NOT DETECTED NOT DETECTED Final   Enterococcus Faecium NOT DETECTED NOT DETECTED Final   Listeria monocytogenes NOT DETECTED NOT DETECTED Final   Staphylococcus species DETECTED (A)  NOT DETECTED Final    Comment: CRITICAL RESULT CALLED TO, READ BACK BY AND VERIFIED WITH: LISA KLUTTZ 08/01/21 @ 0838 BY SB    Staphylococcus aureus (BCID) NOT DETECTED NOT DETECTED Final   Staphylococcus epidermidis NOT DETECTED NOT DETECTED Final   Staphylococcus lugdunensis NOT DETECTED NOT DETECTED Final   Streptococcus species NOT DETECTED NOT DETECTED Final   Streptococcus agalactiae NOT DETECTED NOT DETECTED Final   Streptococcus pneumoniae NOT DETECTED NOT DETECTED Final   Streptococcus pyogenes NOT DETECTED NOT DETECTED Final   A.calcoaceticus-baumannii NOT DETECTED NOT DETECTED Final   Bacteroides fragilis NOT DETECTED NOT DETECTED Final   Enterobacterales NOT DETECTED NOT DETECTED Final   Enterobacter cloacae complex NOT DETECTED NOT DETECTED Final   Escherichia coli NOT DETECTED NOT DETECTED Final   Klebsiella aerogenes NOT DETECTED NOT DETECTED Final   Klebsiella oxytoca NOT DETECTED NOT DETECTED Final   Klebsiella pneumoniae NOT DETECTED NOT DETECTED Final   Proteus species NOT DETECTED NOT DETECTED Final   Salmonella species NOT DETECTED NOT DETECTED Final   Serratia marcescens NOT DETECTED NOT DETECTED Final   Haemophilus influenzae NOT DETECTED NOT DETECTED Final   Neisseria meningitidis NOT DETECTED NOT DETECTED Final   Pseudomonas aeruginosa NOT DETECTED NOT DETECTED Final   Stenotrophomonas maltophilia NOT DETECTED NOT DETECTED Final   Candida albicans NOT DETECTED NOT DETECTED Final   Candida auris NOT DETECTED NOT DETECTED Final   Candida glabrata NOT DETECTED NOT DETECTED Final   Candida krusei NOT DETECTED NOT DETECTED Final   Candida parapsilosis NOT DETECTED NOT DETECTED Final   Candida tropicalis NOT DETECTED NOT DETECTED Final   Cryptococcus neoformans/gattii NOT DETECTED NOT DETECTED Final    Comment: Performed at [facility], [address] [place], [place] [address]  CULTURE, BLOOD (ROUTINE X 2) w Reflex to ID Panel     Status: None  (Preliminary result)   Collection Time: 08/03/21  3:11 PM   Specimen: BLOOD  Result Value Ref Range  Status   Specimen Description BLOOD LFOA  Final   Special Requests BOTTLES DRAWN AEROBIC AND ANAEROBIC BCAV  Final   Culture   Final    NO GROWTH 2 DAYS Performed at [facility], [address] [place], [place] [address]    Report Status PENDING  Incomplete  CULTURE, BLOOD (ROUTINE X 2) w Reflex to ID Panel     Status: None (Preliminary result)   Collection Time: 08/03/21  3:24 PM   Specimen: BLOOD  Result Value Ref Range Status   Specimen Description BLOOD [facility]  Final   Special Requests BOTTLES DRAWN AEROBIC AND ANAEROBIC BCAV  Final   Culture   Final    NO GROWTH 2 DAYS Performed at [facility], [address] [place], [place] [address]    Report Status PENDING  Incomplete      Radiology Studies: No results found.  Scheduled Meds:  atorvastatin  10 mg Oral Daily   brinzolamide  1 drop Both Eyes BID AC & HS   And   brimonidine  1 drop Both Eyes BID AC & HS   busPIRone  5 mg Oral BID   enoxaparin (LOVENOX) injection  40 mg Subcutaneous Q24H   feeding supplement  237 mL Oral TID BM   Ipratropium-Albuterol  1 puff Inhalation QID   latanoprost  1 drop Left Eye QHS   multivitamin with minerals  1 tablet Oral Daily   pantoprazole  40 mg Oral Daily   senna  1 tablet Oral BID   tamsulosin  0.4 mg Oral Daily   umeclidinium-vilanterol  1 puff Inhalation Daily   Continuous Infusions:   ceFAZolin (ANCEF) IV 2 g (08/05/21 0608)     LOS: 4 days   Time spent: 40 minutes. More than 50% of the time was spent in counseling/coordination of care  Lorella Nimrod, MD [facility]  If 7PM-7AM, please contact night-coverage [email or web address]  08/05/2021, 2:57 PM   This record has been created using Systems analyst. Errors have been sought and corrected,but may not always be located. Such creation errors do not reflect on the standard of care.

## 2021-08-05 NOTE — Progress Notes (Signed)
SUBJECTIVE: Todd HUDOCK Sr. is a 81 y.o. male with medical history significant of hypertension, COPD, GERD, prostate cancer, and complete heart block status post pacemaker . By report he has had increased SOB/DOE, weakness-limited mobility and cough for the past 4-5 days. Due to his symptoms he presents to [facility]ED for evaluation. He denied N/V,D, urinary symptoms, sore throat. He does continue to smoke.   No new complaints. Denies chest pain. Shortness of breath improved. Reports he was able to sleep better last night. Overall feeing better.       Vitals:   08/04/21 1955 08/04/21 2256 08/05/21 0441 08/05/21 0534  BP: 114/64 123/70 119/61   Pulse: 70 71 70   Resp: 18 20 18    Temp: 97.7 F (36.5 C) 97.8 F (36.6 C) 98.1 F (36.7 C)   TempSrc: Oral Oral    SpO2: 98% 99% 99%   Weight:    59.4 kg  Height:        Intake/Output Summary (Last 24 hours) at 08/05/2021 0755 Last data filed at 08/05/2021 2993 Gross per 24 hour  Intake 625 ml  Output 1300 ml  Net -675 ml    LABS: Basic Metabolic Panel: Recent Labs    08/04/21 0950 08/05/21 0547  NA 130* 130*  K 3.8 4.2  CL 100 101  CO2 25 25  GLUCOSE 134* 117*  BUN 21 22  CREATININE 0.52* 0.52*  CALCIUM 8.0* 7.8*   Liver Function Tests: Recent Labs    08/03/21 0628  AST 32  ALT 23  ALKPHOS 54  BILITOT 0.4  PROT 5.3*  ALBUMIN 2.9*   No results for input(s): LIPASE, AMYLASE in the last 72 hours. CBC: Recent Labs    08/03/21 0628  WBC 4.8  NEUTROABS 3.3  HGB 10.2*  HCT 29.8*  MCV 97.4  PLT 150   Cardiac Enzymes: No results for input(s): CKTOTAL, CKMB, CKMBINDEX, TROPONINI in the last 72 hours. BNP: Invalid input(s): POCBNP D-Dimer: No results for input(s): DDIMER in the last 72 hours. Hemoglobin A1C: No results for input(s): HGBA1C in the last 72 hours. Fasting Lipid Panel: No results for input(s): CHOL, HDL, LDLCALC, TRIG, CHOLHDL, LDLDIRECT in the last 72 hours. Thyroid Function Tests: No results for  input(s): TSH, T4TOTAL, T3FREE, THYROIDAB in the last 72 hours.  Invalid input(s): FREET3 Anemia Panel: No results for input(s): VITAMINB12, FOLATE, FERRITIN, TIBC, IRON, RETICCTPCT in the last 72 hours.   PHYSICAL EXAM General: Well developed, well nourished, in no acute distress HEENT:  Normocephalic and atramatic Neck:  No JVD.  Lungs: Clear bilaterally to auscultation and percussion. Heart: HRRR . Normal S1 and S2 without gallops or murmurs.  Abdomen: Bowel sounds are positive, abdomen soft and non-tender  Msk:  Back normal, normal gait. Normal strength and tone for age. Extremities: No clubbing, cyanosis or edema.   Neuro: Alert and oriented X 3. Psych:  Good affect, responds appropriately  TELEMETRY: sinus rhythm, HR 72  ASSESSMENT AND PLAN: Patient feeling better overall. Covid positive 07/31/21, completed remdesivir yesterday. No TEE at this time due to Covid positive. Repeat blood cultures pending. Will continue to follow.  Principal Problem:   Dehydration, moderate Active Problems:   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HCC)   Acute dehydration   COVID-19 virus infection   Emphysema lung (HCC)   Acute on chronic combined systolic and diastolic CHF (congestive heart failure) ([place])    Ossie Yebra, FNP-C 08/05/2021 7:55 AM
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
## 2021-08-05 NOTE — TOC Initial Note (Signed)
Transition of Care (TOC) - Initial/Assessment Note    Patient Details  Name: Todd LITTLES Sr. MRN: [id number] Date of Birth: [birth year]  Transition of Care [facility]) CM/SW Contact:    Eileen Stanford, LCSW Phone Number: 08/05/2021, 10:35 AM  Clinical Narrative:    CSW spoke with pt regarding recommendations. OT recommended SNF and PT recommended HH. Pt is stating he would like HH. Pt lives at [place] with Spouse. Pt had no HH agency preference.  Corene Cornea with [facility] will service pt. [employer] will deliver 3n1 and bsc to beside.               Expected Discharge Plan: [place] Barriers to Discharge: Continued Medical Work up   Patient Goals and CMS Choice Patient states their goals for this hospitalization and ongoing recovery are:: to go home   Choice offered to / list presented to : Patient  Expected Discharge Plan and Services Expected Discharge Plan: [place] In-house Referral: Clinical Social Work   Post Acute Care Choice: [place] arrangements for the past 2 months: Hotel/Motel                 DME Arranged: 3-N-1, Bedside commode DME Agency: [employer] Date DME Agency Contacted: 08/05/21 Time DME Agency Contacted: [phone] Representative spoke with at DME Agency: [facility] HH Arranged: RN, PT, OT [place] Agency: [facility] ([place]) Date [place]: 08/05/21 Time [place]: 1033 Representative spoke with at [place]: [place] Arrangements/Services Living arrangements for the past 2 months: Hotel/Motel Lives with:: Spouse Patient language and need for interpreter reviewed:: Yes Do you feel safe going back to the place where you live?: Yes      Need for Family Participation in Patient Care: Yes (Comment) Care giver support system in place?: Yes (comment)   Criminal Activity/Legal Involvement Pertinent to Current Situation/Hospitalization: No - Comment as needed  Activities of Daily  Living Home Assistive Devices/Equipment: Eyeglasses, Dentures (specify type), Cane (specify quad or straight), Wheelchair, Environmental consultant (specify type) ADL Screening (condition at time of admission) Patient's cognitive ability adequate to safely complete daily activities?: Yes Is the patient deaf or have difficulty hearing?: No Does the patient have difficulty seeing, even when wearing glasses/contacts?: No Does the patient have difficulty concentrating, remembering, or making decisions?: No Patient able to express need for assistance with ADLs?: Yes Does the patient have difficulty dressing or bathing?: Yes Independently performs ADLs?: No Communication: Independent Dressing (OT): Needs assistance Is this a change from baseline?: Pre-admission baseline Grooming: Independent Feeding: Independent Bathing: Needs assistance Is this a change from baseline?: Pre-admission baseline Toileting: Needs assistance Is this a change from baseline?: Pre-admission baseline In/Out Bed: Needs assistance Is this a change from baseline?: Pre-admission baseline Walks in Home: Needs assistance Is this a change from baseline?: Pre-admission baseline Does the patient have difficulty walking or climbing stairs?: Yes Weakness of Legs: Both Weakness of Arms/Hands: None  Permission Sought/Granted   Permission granted to share information with : Yes, Verbal Permission Granted  Share Information with NAME: Tamela Oddi     Permission granted to share info w Relationship: spouse     Emotional Assessment Appearance:: Appears stated age Attitude/Demeanor/Rapport: Engaged Affect (typically observed): Accepting Orientation: : Oriented to Self, Oriented to Place, Oriented to  Time, Oriented to Situation Alcohol / Substance Use: Not Applicable Psych Involvement: No (comment)  Admission diagnosis:  Dehydration [E86.0] Tobacco abuse [Z72.0] Dehydration, moderate [E86.0] Troponin I above reference  range [R77.8] Pulmonary  emphysema, unspecified emphysema type ([place]) [J43.9] COVID [U07.1] Patient Active Problem List   Diagnosis Date Noted   Gram-positive cocci bacteremia    Acute dehydration 07/31/2021   COVID-19 virus infection 07/31/2021   Emphysema lung ([place]) 07/31/2021   Acute on chronic combined systolic and diastolic CHF (congestive heart failure) ([place]) 07/31/2021   Dehydration, moderate 07/31/2021   Moderate protein-calorie malnutrition ([place]) 08/15/2019   Protein-calorie malnutrition, severe 08/15/2019   COPD with acute exacerbation ([place]) 08/14/2019   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[place]) 08/14/2019   Exertional chest pain 08/14/2019   Nicotine dependence 08/14/2019   PCP:  Jodi Marble, MD Pharmacy:   [id number] [address], [address] AT [address] [place] [address] Phone: [phone] Fax: [phone]  [id number] - [place], [address] [place] [address] Phone: [phone] Fax: [phone]     Social Determinants of Health ([place]) Interventions    Readmission Risk Interventions No flowsheet data found.

## 2021-08-06 DIAGNOSIS — R7881 Bacteremia: Secondary | ICD-10-CM | POA: Diagnosis not present

## 2021-08-06 DIAGNOSIS — J439 Emphysema, unspecified: Secondary | ICD-10-CM | POA: Diagnosis not present

## 2021-08-06 DIAGNOSIS — U071 COVID-19: Secondary | ICD-10-CM | POA: Diagnosis not present

## 2021-08-06 DIAGNOSIS — B957 Other staphylococcus as the cause of diseases classified elsewhere: Secondary | ICD-10-CM | POA: Diagnosis not present

## 2021-08-06 LAB — RESP PANEL BY RT-PCR (FLU A&B, COVID) ARPGX2
Influenza A by PCR: NEGATIVE
Influenza B by PCR: NEGATIVE
SARS Coronavirus 2 by RT PCR: POSITIVE — AB

## 2021-08-06 LAB — OSMOLALITY, URINE: Osmolality, Ur: 406 mOsm/kg (ref 300–900)

## 2021-08-06 LAB — TSH: TSH: 3.484 u[IU]/mL (ref 0.350–4.500)

## 2021-08-06 MED ORDER — SODIUM CHLORIDE 0.9 % IV BOLUS
250.0000 mL | Freq: Once | INTRAVENOUS | Status: AC
Start: 2021-08-06 — End: 2021-08-06
  Administered 2021-08-06: 250 mL via INTRAVENOUS

## 2021-08-06 MED ORDER — IBUPROFEN 400 MG PO TABS: 400.0000 mg | ORAL_TABLET | Freq: Four times a day (QID) | ORAL | Status: DC | PRN

## 2021-08-06 NOTE — Progress Notes (Addendum)
Pt BP at 96/48 MAP 60 HR 73. On assesmsent pt did have clear dimionshed lung sounds.NP Randol Kern made aware.  Update 2147: NP Randol Kern placed order sodium chloride 0.9 % 250 ml bolus once. Will continue to monitor.  Update 2231. BP at 114 57 MAP 72 HR 77 after bolus. Pt tolerated well. NP Randol Kern made aware. Will continue to monitor.

## 2021-08-06 NOTE — Progress Notes (Addendum)
Physical Therapy Treatment Patient Details Name: Todd Webster. MRN: [id number] DOB: 1940-11-02 Today's Date: 08/06/2021   History of Present Illness Pt admitted for dehydration with complaints of cough. History includes HTN, COPD, GERD, RA, prostate ca, and pacemaker. Pt also positive for covid at this time.    PT Comments    Pt is making good progress towards goals, however needs redirection and cues for safety regarding line/lead attachment. Pt impulsive with mobility, however agreeable to OOB mobility. Requires B UE support, recommend use of RW for all long distance mobility. Pt is very close to baseline level as he presents with limited baseline ambulation, choosing to mobilize with office chair in hotel room. Good endurance with there-ex. Will continue to progress.   Recommendations for follow up therapy are one component of a multi-disciplinary discharge planning process, led by the attending physician.  Recommendations may be updated based on patient status, additional functional criteria and insurance authorization.  Follow Up Recommendations  Home health PT     Assistance Recommended at Discharge Intermittent Supervision/Assistance  Patient can return home with the following A little help with walking and/or transfers;Help with stairs or ramp for entrance   Equipment Recommendations  BSC/3in1    Recommendations for Other Services       Precautions / Restrictions Precautions Precautions: Fall Restrictions Weight Bearing Restrictions: No     Mobility  Bed Mobility Overal bed mobility: Needs Assistance Bed Mobility: Supine to Sit     Supine to sit: Min guard Sit to supine: Supervision   General bed mobility comments: reaches for therapist hand during transfer. Once seated at EOB, upright posture noted    Transfers Overall transfer level: Needs assistance Equipment used: 1 person hand held assist Transfers: Sit to/from Stand Sit to Stand: Supervision            General transfer comment: multiple sit<>Stand at bedside. Required B UE for support and momentum    Ambulation/Gait Ambulation/Gait assistance: Min guard Gait Distance (Feet): 10 Feet Assistive device: 1 person hand held assist Gait Pattern/deviations: Step-through pattern       General Gait Details: ambulated at bedside due to line/lead attachment. Side steps performed with cues for sequencing as pt is slightly impuslive with activity   Stairs             Wheelchair Mobility    Modified Rankin (Stroke Patients Only)       Balance Overall balance assessment: Needs assistance Sitting-balance support: No upper extremity supported;Feet supported Sitting balance-Leahy Scale: Good     Standing balance support: Single extremity supported;During functional activity Standing balance-Leahy Scale: Poor                              Cognition Arousal/Alertness: Awake/alert Behavior During Therapy: WFL for tasks assessed/performed Overall Cognitive Status: Within Functional Limits for tasks assessed                                 General Comments: somewhat impulsive with tasks        Exercises Other Exercises Other Exercises: supine/seated/standing ther-ex performed on B LE including SLRs, hip abd/add, hip add squeezes, alt marching, bicycle kicks, standing hip ab/ad, heel raises, and mini squats. 10 reps performed    General Comments        Pertinent Vitals/Pain Pain Assessment: No/denies pain    Home Living  Prior Function            PT Goals (current goals can now be found in the care plan section) Acute Rehab PT Goals Patient Stated Goal: to get a wheelchair PT Goal Formulation: With patient Time For Goal Achievement: 08/18/21 Potential to Achieve Goals: Good Progress towards PT goals: Progressing toward goals    Frequency    Min 2X/week      PT Plan Current plan remains  appropriate    Co-evaluation              AM-PAC PT "6 Clicks" Mobility   Outcome Measure  Help needed turning from your back to your side while in a flat bed without using bedrails?: None Help needed moving from lying on your back to sitting on the side of a flat bed without using bedrails?: A Little Help needed moving to and from a bed to a chair (including a wheelchair)?: A Little Help needed standing up from a chair using your arms (e.g., wheelchair or bedside chair)?: A Little Help needed to walk in hospital room?: A Little Help needed climbing 3-5 steps with a railing? : A Lot 6 Click Score: 18    End of Session   Activity Tolerance: Patient tolerated treatment well Patient left: in bed;with bed alarm set;with nursing/sitter in room Nurse Communication: Mobility status PT Visit Diagnosis: Difficulty in walking, not elsewhere classified (R26.2);Pain     Time: 1440-1506 PT Time Calculation (min) (ACUTE ONLY): 26 min  Charges:  $Gait Training: 8-22 mins $Therapeutic Exercise: 8-22 mins                     Greggory Stallion, PT, DPT [phone]    Genesia Caslin 08/06/2021, 4:20 PM
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
## 2021-08-06 NOTE — Progress Notes (Signed)
SUBJECTIVE: Todd OAKLEY Sr. is a 81 y.o. male with medical history significant of hypertension, COPD, GERD, prostate cancer, and complete heart block status post pacemaker . By report he has had increased SOB/DOE, weakness-limited mobility and cough for the past 4-5 days. Due to his symptoms he presents to [facility] for evaluation. He denied N/V,D, urinary symptoms, sore throat. He does continue to smoke.   No new complaints. Denies chest pain. Shortness of breath improved. Overall feeing better.     Vitals:   08/05/21 1946 08/05/21 2340 08/06/21 0500 08/06/21 0759  BP: 115/62 (!) 106/56 (!) 103/53 (!) 107/59  Pulse: 72  72 70  Resp: 20 20 18 19   Temp: 97.7 F (36.5 C) 97.7 F (36.5 C) 98 F (36.7 C) 97.9 F (36.6 C)  TempSrc: Oral Oral Oral Oral  SpO2: 98% 96% 98% 97%  Weight:   58.3 kg   Height:        Intake/Output Summary (Last 24 hours) at 08/06/2021 0939 Last data filed at 08/06/2021 0502 Gross per 24 hour  Intake 1020 ml  Output 2425 ml  Net -1405 ml    LABS: Basic Metabolic Panel: Recent Labs    08/04/21 0950 08/05/21 0547  NA 130* 130*  K 3.8 4.2  CL 100 101  CO2 25 25  GLUCOSE 134* 117*  BUN 21 22  CREATININE 0.52* 0.52*  CALCIUM 8.0* 7.8*   Liver Function Tests: No results for input(s): AST, ALT, ALKPHOS, BILITOT, PROT, ALBUMIN in the last 72 hours. No results for input(s): LIPASE, AMYLASE in the last 72 hours. CBC: No results for input(s): WBC, NEUTROABS, HGB, HCT, MCV, PLT in the last 72 hours. Cardiac Enzymes: No results for input(s): CKTOTAL, CKMB, CKMBINDEX, TROPONINI in the last 72 hours. BNP: Invalid input(s): POCBNP D-Dimer: No results for input(s): DDIMER in the last 72 hours. Hemoglobin A1C: No results for input(s): HGBA1C in the last 72 hours. Fasting Lipid Panel: No results for input(s): CHOL, HDL, LDLCALC, TRIG, CHOLHDL, LDLDIRECT in the last 72 hours. Thyroid Function Tests: No results for input(s): TSH, T4TOTAL, T3FREE, THYROIDAB in  the last 72 hours.  Invalid input(s): FREET3 Anemia Panel: No results for input(s): VITAMINB12, FOLATE, FERRITIN, TIBC, IRON, RETICCTPCT in the last 72 hours.   PHYSICAL EXAM General: Well developed, well nourished, in no acute distress HEENT:  Normocephalic and atramatic Neck:  No JVD.  Lungs: Clear bilaterally to auscultation and percussion. Heart: HRRR . Normal S1 and S2 without gallops or murmurs.  Abdomen: Bowel sounds are positive, abdomen soft and non-tender  Msk:  Back normal, normal gait. Normal strength and tone for age. Extremities: No clubbing, cyanosis or edema.   Neuro: Alert and oriented X 3. Psych:  Good affect, responds appropriately  TELEMETRY: paced rhythm, HR 71 bpm  ASSESSMENT AND PLAN: Patient feeling better overall. Covid positive 07/31/21, completed remdesivir 08/04/21. No TEE at this time due to Covid positive. Repeat blood cultures pending, no growth at 3 days. Recommend repeating Covid test, if negative, will plan for TEE.  Will continue to follow.  Principal Problem:   Dehydration, moderate Active Problems:   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HCC)   Acute dehydration   COVID-19 virus infection   Emphysema lung (HCC)   Acute on chronic combined systolic and diastolic CHF (congestive heart failure) ([place])    Todd Kreamer, FNP-C 08/06/2021 9:39 AM

## 2021-08-06 NOTE — Progress Notes (Signed)
PROGRESS NOTE    Todd Webster  ERD:[id number] DOB: 04-06-1941 DOA: 07/31/2021 PCP: Jodi Marble, MD   Brief Narrative: Taken from prior notes. Todd Kocher Sr. is a 81 y.o. male with a PMH significant for HTN, COPD, GERD, prostate cancer, complete heart block s/p pacemaker. They presented from home to the ED on 07/31/2021 with SOB x5 days. In the ED, it was found that they had COVID. Additionally, he had hypotension which responded to IV fluid boluses, febrile, AMS. They were treated with supplemental oxygen, remdesivir, and steroid initiation.  Blood cultures, 4 out of 4 bottles came back positive for staph Capitis, pretty pansensitive. He was initially started on vancomycin which was transitioned to cefazolin. Repeat echocardiogram with normalization of EF and no vegetations. Cardiology was consulted for TEE, looks like no plan yet due to positive COVID status. I asked ID today to help with duration and type of antibiotic as unable to get TEE, they will put their recommendations later today. PT is recommending home health and OT is recommending SNF, patient himself wants to go home with home health.  TOC to coordinate with family.  Subjective: Patient was seen and examined today.  No new complaints.  Assessment & Plan:   Principal Problem:   Dehydration, moderate Active Problems:   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HCC)   Acute dehydration   COVID-19 virus infection   Emphysema lung (HCC)   Acute on chronic combined systolic and diastolic CHF (congestive heart failure) ([place])  COVID-19 infection.  Remained on room air.  Completed a 5-day course of remdesivir and steroids -Continue with supportive care and supplements  Staph Capitis bacteremia.  4 out of 4 positive blood culture bottles.  Pansensitive  High risk for endocarditis due to pacemaker in place.  Echocardiogram/TTE with normalization of EF and no valvular abnormality noted.  Will need TEE due to his risk. Looks like  cardiology would like to defer due to positive COVID status, they advise repeat covid test today, if neg can proceed w/ tee -Vancomycin was switched with cefazolin -Repeat blood cultures-negative so far - ID following for long-term abx plan. Weekly dalbavancin may be best option  Debility Declines snf - plan for HH PT, OT, RN, bedside commode, rolling walker (ordered)  History of HFrEF.  Appears euvolemic.  Normalization of EF on repeat TTE. -Continue to monitor  Hypertension.  Blood pressure seems within goal. Home antihypertensives were held due to softer blood pressure. -Continue to monitor  Hyponatremia.  130 yesterday. Hx low normal sodium. Not volume overloaded. No suspicious pulmonary lesions on CT. - f/u urine osm - f/u tsh  - f/u am acth stim test  Third-degree heart block S/P pacemaker in place.  COPD.  No concern of exacerbation -Continue home bronchodilators  Severe protein caloric malnutrition. Estimated body mass index is 17.43 kg/m as calculated from the following:   Height as of this encounter: 6' (1.829 m).   Weight as of this encounter: 58.3 kg.  -Dietitian consulted  Objective: Vitals:   08/05/21 2340 08/06/21 0500 08/06/21 0759 08/06/21 1148  BP: (!) 106/56 (!) 103/53 (!) 107/59 111/63  Pulse:  72 70   Resp: 20 18 19 18   Temp: 97.7 F (36.5 C) 98 F (36.7 C) 97.9 F (36.6 C) 98 F (36.7 C)  TempSrc: Oral Oral Oral Oral  SpO2: 96% 98% 97% 96%  Weight:  58.3 kg    Height:        Intake/Output Summary (Last 24 hours)  at 08/06/2021 1443 Last data filed at 08/06/2021 1148 Gross per 24 hour  Intake 780 ml  Output 2625 ml  Net -1845 ml   Filed Weights   08/04/21 0550 08/05/21 0534 08/06/21 0500  Weight: 60.9 kg 59.4 kg 58.3 kg    Examination:  General.  Malnourished elderly man, in no acute distress. Pulmonary.  Lungs clear bilaterally, normal respiratory effort. CV.  Regular rate and rhythm, no JVD, rub or murmur. Abdomen.  Soft, nontender,  nondistended, BS positive. CNS.  Alert and oriented .  No focal neurologic deficit. Extremities.  No edema, no cyanosis, pulses intact and symmetrical. Psychiatry.  Judgment and insight appears normal.   DVT prophylaxis: Lovenox Code Status: Full Family Communication: Called wife, "call cannot go through as dialed" Disposition Plan:  Status is: Inpatient  Remains inpatient appropriate because: Severity of illness   Level of care: Progressive  All the records are reviewed and case discussed with Care Management/Social Worker. Management plans discussed with the patient, nursing and they are in agreement.  Consultants:  Cardiology  Procedures:  Antimicrobials:  Cefazolin  Data Reviewed: I have personally reviewed following labs and imaging studies  CBC: Recent Labs  Lab 07/31/21 0413 08/01/21 0658 08/02/21 0555 08/03/21 0628  WBC 6.0 3.8* 4.4 4.8  NEUTROABS 3.7 2.5  --  3.3  HGB 11.4* 11.1* 10.2* 10.2*  HCT 33.9* 32.5* 30.7* 29.8*  MCV 100.0 99.1 99.0 97.4  PLT 193 142* 150 277   Basic Metabolic Panel: Recent Labs  Lab 08/01/21 0658 08/02/21 0555 08/03/21 0628 08/04/21 0950 08/05/21 0547  NA 137 132* 129* 130* 130*  K 4.1 4.1 4.3 3.8 4.2  CL 102 99 100 100 101  CO2 26 26 23 25 25   GLUCOSE 107* 112* 111* 134* 117*  BUN 20 21 21 21 22   CREATININE 0.67 0.64 0.60* 0.52* 0.52*  CALCIUM 8.7* 8.3* 8.2* 8.0* 7.8*  MG  --  1.7  --   --   --   PHOS  --  3.2  --   --   --    GFR: Estimated Creatinine Clearance: 61.7 mL/min (A) (by C-G formula based on SCr of 0.52 mg/dL (L)). Liver Function Tests: Recent Labs  Lab 07/31/21 0413 07/31/21 0650 08/02/21 0555 08/03/21 0628  AST 20  --  23 32  ALT 12  --  13 23  ALKPHOS 81  --  60 54  BILITOT 0.7  --  0.5 0.4  PROT 7.2 6.8 5.6* 5.3*  ALBUMIN 4.0  --  3.1* 2.9*   No results for input(s): LIPASE, AMYLASE in the last 168 hours. No results for input(s): AMMONIA in the last 168 hours. Coagulation  Profile: Recent Labs  Lab 07/31/21 1330  INR 1.3*   Cardiac Enzymes: No results for input(s): CKTOTAL, CKMB, CKMBINDEX, TROPONINI in the last 168 hours. BNP (last 3 results) No results for input(s): PROBNP in the last 8760 hours. HbA1C: No results for input(s): HGBA1C in the last 72 hours. CBG: Recent Labs  Lab 08/03/21 1129 08/03/21 1636  GLUCAP 150* 163*   Lipid Profile: No results for input(s): CHOL, HDL, LDLCALC, TRIG, CHOLHDL, LDLDIRECT in the last 72 hours. Thyroid Function Tests: No results for input(s): TSH, T4TOTAL, FREET4, T3FREE, THYROIDAB in the last 72 hours. Anemia Panel: No results for input(s): VITAMINB12, FOLATE, FERRITIN, TIBC, IRON, RETICCTPCT in the last 72 hours. Sepsis Labs: Recent Labs  Lab 07/31/21 0650 07/31/21 1113 08/01/21 1117  PROCALCITON <0.10  --   --  LATICACIDVEN  --  0.9 1.9    Recent Results (from the past 240 hour(s))  Resp Panel by RT-PCR (Flu A&B, Covid) Nasopharyngeal Swab     Status: Abnormal   Collection Time: 07/31/21  4:20 AM   Specimen: Nasopharyngeal Swab; Nasopharyngeal(NP) swabs in vial transport medium  Result Value Ref Range Status   SARS Coronavirus 2 by RT PCR POSITIVE (A) NEGATIVE Final    Comment: (NOTE) SARS-CoV-2 target nucleic acids are DETECTED.  The SARS-CoV-2 RNA is generally detectable in upper respiratory specimens during the acute phase of infection. Positive results are indicative of the presence of the identified virus, but do not rule out bacterial infection or co-infection with other pathogens not detected by the test. Clinical correlation with patient history and other diagnostic information is necessary to determine patient infection status. The expected result is Negative.  Fact Sheet for Patients: EntrepreneurPulse.com.au  Fact Sheet for Healthcare Providers: IncredibleEmployment.be  This test is not yet approved or cleared by the [place] FDA and   has been authorized for detection and/or diagnosis of SARS-CoV-2 by FDA under an Emergency Use Authorization (EUA).  This EUA will remain in effect (meaning this test can be used) for the duration of  the COVID-19 declaration under Section 564(b)(1) of the A ct, 21 U.S.C. section 360bbb-3(b)(1), unless the authorization is terminated or revoked sooner.     Influenza A by PCR NEGATIVE NEGATIVE Final   Influenza B by PCR NEGATIVE NEGATIVE Final    Comment: (NOTE) The Xpert Xpress SARS-CoV-2/FLU/RSV plus assay is intended as an aid in the diagnosis of influenza from Nasopharyngeal swab specimens and should not be used as a sole basis for treatment. Nasal washings and aspirates are unacceptable for Xpert Xpress SARS-CoV-2/FLU/RSV testing.  Fact Sheet for Patients: EntrepreneurPulse.com.au  Fact Sheet for Healthcare Providers: IncredibleEmployment.be  This test is not yet approved or cleared by the [place] FDA and has been authorized for detection and/or diagnosis of SARS-CoV-2 by FDA under an Emergency Use Authorization (EUA). This EUA will remain in effect (meaning this test can be used) for the duration of the COVID-19 declaration under Section 564(b)(1) of the Act, 21 U.S.C. section 360bbb-3(b)(1), unless the authorization is terminated or revoked.  Performed at [facility], [address] [place], [place] [address]   Blood Culture (routine x 2)     Status: Abnormal   Collection Time: 07/31/21  1:24 PM   Specimen: BLOOD  Result Value Ref Range Status   Specimen Description   Final    BLOOD RIGHT ANTECUBITAL Performed at [facility], [address] [place], [place] [address]    Special Requests   Final    BOTTLES DRAWN AEROBIC AND ANAEROBIC Blood Culture adequate volume Performed at [facility], [address] [place], [place] [address]    Culture  Setup Time   Final    GRAM POSITIVE COCCI IN  BOTH AEROBIC AND ANAEROBIC BOTTLES Gram Stain Report Called to,Read Back By and Verified With: LISA KLUTTZ 08/01/21 @ 0838 BY SB Performed at [facility], [address] [place], [place] [address]    Culture (A)  Final    STAPHYLOCOCCUS CAPITIS SUSCEPTIBILITIES PERFORMED ON PREVIOUS CULTURE WITHIN THE LAST 5 DAYS. Performed at [facility], [place] [address], [place], [place] [address]    Report Status 08/03/2021 FINAL  Final  Blood Culture (routine x 2)     Status: Abnormal   Collection Time: 07/31/21  1:25 PM  Specimen: BLOOD  Result Value Ref Range Status   Specimen Description   Final    BLOOD BLOOD RIGHT HAND Performed at [facility], [address], [place], [place] [address]    Special Requests   Final    BOTTLES DRAWN AEROBIC AND ANAEROBIC Blood Culture adequate volume Performed at [facility], [address] [place], [place] [address]    Culture  Setup Time   Final    GRAM POSITIVE COCCI IN BOTH AEROBIC AND ANAEROBIC BOTTLES Gram Stain Report Called to,Read Back By and Verified With: LISA KLUTTZ 08/01/21 @ 0838 BY SB Performed at [facility], [place] [address] [place], [place] [address]    Culture STAPHYLOCOCCUS CAPITIS (A)  Final   Report Status 08/03/2021 FINAL  Final   Organism ID, Bacteria STAPHYLOCOCCUS CAPITIS  Final      Susceptibility   Staphylococcus capitis - MIC*    CIPROFLOXACIN <=0.5 SENSITIVE Sensitive     ERYTHROMYCIN <=0.25 SENSITIVE Sensitive     GENTAMICIN <=0.5 SENSITIVE Sensitive     OXACILLIN <=0.25 SENSITIVE Sensitive     TETRACYCLINE <=1 SENSITIVE Sensitive     VANCOMYCIN <=0.5 SENSITIVE Sensitive     TRIMETH/SULFA <=10 SENSITIVE Sensitive     CLINDAMYCIN <=0.25 SENSITIVE Sensitive     RIFAMPIN <=0.5 SENSITIVE Sensitive     Inducible Clindamycin NEGATIVE Sensitive     * STAPHYLOCOCCUS CAPITIS  Blood Culture ID Panel (Reflexed)     Status: Abnormal   Collection Time: 07/31/21  1:25 PM  Result Value  Ref Range Status   Enterococcus faecalis NOT DETECTED NOT DETECTED Final   Enterococcus Faecium NOT DETECTED NOT DETECTED Final   Listeria monocytogenes NOT DETECTED NOT DETECTED Final   Staphylococcus species DETECTED (A) NOT DETECTED Final    Comment: CRITICAL RESULT CALLED TO, READ BACK BY AND VERIFIED WITH: LISA KLUTTZ 08/01/21 @ 0838 BY SB    Staphylococcus aureus (BCID) NOT DETECTED NOT DETECTED Final   Staphylococcus epidermidis NOT DETECTED NOT DETECTED Final   Staphylococcus lugdunensis NOT DETECTED NOT DETECTED Final   Streptococcus species NOT DETECTED NOT DETECTED Final   Streptococcus agalactiae NOT DETECTED NOT DETECTED Final   Streptococcus pneumoniae NOT DETECTED NOT DETECTED Final   Streptococcus pyogenes NOT DETECTED NOT DETECTED Final   A.calcoaceticus-baumannii NOT DETECTED NOT DETECTED Final   Bacteroides fragilis NOT DETECTED NOT DETECTED Final   Enterobacterales NOT DETECTED NOT DETECTED Final   Enterobacter cloacae complex NOT DETECTED NOT DETECTED Final   Escherichia coli NOT DETECTED NOT DETECTED Final   Klebsiella aerogenes NOT DETECTED NOT DETECTED Final   Klebsiella oxytoca NOT DETECTED NOT DETECTED Final   Klebsiella pneumoniae NOT DETECTED NOT DETECTED Final   Proteus species NOT DETECTED NOT DETECTED Final   Salmonella species NOT DETECTED NOT DETECTED Final   Serratia marcescens NOT DETECTED NOT DETECTED Final   Haemophilus influenzae NOT DETECTED NOT DETECTED Final   Neisseria meningitidis NOT DETECTED NOT DETECTED Final   Pseudomonas aeruginosa NOT DETECTED NOT DETECTED Final   Stenotrophomonas maltophilia NOT DETECTED NOT DETECTED Final   Candida albicans NOT DETECTED NOT DETECTED Final   Candida auris NOT DETECTED NOT DETECTED Final   Candida glabrata NOT DETECTED NOT DETECTED Final   Candida krusei NOT DETECTED NOT DETECTED Final   Candida parapsilosis NOT DETECTED NOT DETECTED Final   Candida tropicalis NOT DETECTED NOT DETECTED Final    Cryptococcus neoformans/gattii NOT DETECTED NOT DETECTED Final    Comment: Performed at [facility], [address] [place], [place] [address]  CULTURE, BLOOD (ROUTINE X 2) w Reflex to ID Panel     Status: None (Preliminary result)   Collection Time: 08/03/21  3:11 PM   Specimen: BLOOD  Result Value Ref Range Status   Specimen Description BLOOD LFOA  Final   Special Requests BOTTLES DRAWN AEROBIC AND ANAEROBIC BCAV  Final   Culture   Final    NO GROWTH 3 DAYS Performed at [facility], [address] [place], [place] [address]    Report Status PENDING  Incomplete  CULTURE, BLOOD (ROUTINE X 2) w Reflex to ID Panel     Status: None (Preliminary result)   Collection Time: 08/03/21  3:24 PM   Specimen: BLOOD  Result Value Ref Range Status   Specimen Description BLOOD [facility]  Final   Special Requests BOTTLES DRAWN AEROBIC AND ANAEROBIC BCAV  Final   Culture   Final    NO GROWTH 3 DAYS Performed at [facility], [address] [place],  [address]    Report Status PENDING  Incomplete      Radiology Studies: No results found.  Scheduled Meds:  atorvastatin  10 mg Oral Daily   brinzolamide  1 drop Both Eyes BID AC & HS   And   brimonidine  1 drop Both Eyes BID AC & HS   busPIRone  5 mg Oral BID   enoxaparin (LOVENOX) injection  40 mg Subcutaneous Q24H   feeding supplement  237 mL Oral TID BM   Ipratropium-Albuterol  1 puff Inhalation QID   latanoprost  1 drop Left Eye QHS   multivitamin with minerals  1 tablet Oral Daily   pantoprazole  40 mg Oral Daily   senna  1 tablet Oral BID   tamsulosin  0.4 mg Oral Daily   umeclidinium-vilanterol  1 puff Inhalation Daily   Continuous Infusions:   ceFAZolin (ANCEF) IV 2 g (08/06/21 0502)     LOS: 5 days   Time spent: 30 minutes.   Desma Maxim, MD [facility]  If 7PM-7AM, please contact night-coverage [email or web address]  08/06/2021, 2:43 PM

## 2021-08-07 LAB — BASIC METABOLIC PANEL
Anion gap: 7 (ref 5–15)
BUN: 17 mg/dL (ref 8–23)
CO2: 29 mmol/L (ref 22–32)
Calcium: 8 mg/dL — ABNORMAL LOW (ref 8.9–10.3)
Chloride: 97 mmol/L — ABNORMAL LOW (ref 98–111)
Creatinine, Ser: 0.54 mg/dL — ABNORMAL LOW (ref 0.61–1.24)
GFR, Estimated: >60 mL/min (ref >60)
Glucose, Bld: 87 mg/dL (ref 70–99)
Potassium: 4.1 mmol/L (ref 3.5–5.1)
Sodium: 133 mmol/L — ABNORMAL LOW (ref 135–145)

## 2021-08-07 LAB — CBC
HCT: 32.5 % — ABNORMAL LOW (ref 39.0–52.0)
Hemoglobin: 11.2 g/dL — ABNORMAL LOW (ref 13.0–17.0)
MCH: 33.2 pg (ref 26.0–34.0)
MCHC: 34.5 g/dL (ref 30.0–36.0)
MCV: 96.4 fL (ref 80.0–100.0)
Platelets: 203 10*3/uL (ref 150–400)
RBC: 3.37 MIL/uL — ABNORMAL LOW (ref 4.22–5.81)
RDW: 14.3 % (ref 11.5–15.5)
WBC: 8.8 10*3/uL (ref 4.0–10.5)
nRBC: 0 % (ref 0.0–0.2)

## 2021-08-07 MED ORDER — DEXTROSE 5 % IV SOLN
1500.0000 mg | Freq: Once | INTRAVENOUS | Status: AC
  Administered 2021-08-07: 1500 mg via INTRAVENOUS
  Filled 2021-08-07: qty 75

## 2021-08-07 MED ORDER — CEPHALEXIN 500 MG PO CAPS: 500.0000 mg | ORAL_CAPSULE | Freq: Four times a day (QID) | ORAL | 0 refills | Status: AC
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
## 2021-08-07 NOTE — Progress Notes (Signed)
ACTH stimulation was not drawn today lab came around 0630. Incoming shift made aware. Will continue to monitor

## 2021-08-07 NOTE — Progress Notes (Signed)
SUBJECTIVE: Todd DINI Sr. is a 81 y.o. male with medical history significant of hypertension, COPD, GERD, prostate cancer, and complete heart block status post pacemaker . By report he has had increased SOB/DOE, weakness-limited mobility and cough for the past 4-5 days. Due to his symptoms he presents to [facility]ED for evaluation. He denied N/V,D, urinary symptoms, sore throat. He does continue to smoke.   No new complaints. Denies chest pain. Shortness of breath improved. Overall feeing better. Patient agitated this morning, states he does not know what is going on.   Vitals:   08/07/21 0000 08/07/21 0416 08/07/21 0500 08/07/21 0719  BP:  (!) 122/58  (!) 115/52  Pulse:  75  73  Resp:  18  18  Temp:  97.8 F (36.6 C)  98.9 F (37.2 C)  TempSrc:  Oral  Oral  SpO2:  97%  98%  Weight: 58.6 kg  58.6 kg   Height:        Intake/Output Summary (Last 24 hours) at 08/07/2021 0832 Last data filed at 08/07/2021 1478 Gross per 24 hour  Intake 1180 ml  Output 3400 ml  Net -2220 ml    LABS: Basic Metabolic Panel: Recent Labs    08/05/21 0547 08/07/21 0701  NA 130* 133*  K 4.2 4.1  CL 101 97*  CO2 25 29  GLUCOSE 117* 87  BUN 22 17  CREATININE 0.52* 0.54*  CALCIUM 7.8* 8.0*   Liver Function Tests: No results for input(s): AST, ALT, ALKPHOS, BILITOT, PROT, ALBUMIN in the last 72 hours. No results for input(s): LIPASE, AMYLASE in the last 72 hours. CBC: Recent Labs    08/07/21 0701  WBC 8.8  HGB 11.2*  HCT 32.5*  MCV 96.4  PLT 203   Cardiac Enzymes: No results for input(s): CKTOTAL, CKMB, CKMBINDEX, TROPONINI in the last 72 hours. BNP: Invalid input(s): POCBNP D-Dimer: No results for input(s): DDIMER in the last 72 hours. Hemoglobin A1C: No results for input(s): HGBA1C in the last 72 hours. Fasting Lipid Panel: No results for input(s): CHOL, HDL, LDLCALC, TRIG, CHOLHDL, LDLDIRECT in the last 72 hours. Thyroid Function Tests: Recent Labs    08/06/21 1632  TSH 3.484    Anemia Panel: No results for input(s): VITAMINB12, FOLATE, FERRITIN, TIBC, IRON, RETICCTPCT in the last 72 hours.   PHYSICAL EXAM General: Well developed, well nourished, in no acute distress HEENT:  Normocephalic and atramatic Neck:  No JVD.  Lungs: Clear bilaterally to auscultation and percussion. Heart: HRRR . Normal S1 and S2 without gallops or murmurs.  Abdomen: Bowel sounds are positive, abdomen soft and non-tender  Msk:  Back normal, normal gait. Normal strength and tone for age. Extremities: No clubbing, cyanosis or edema.   Neuro: Alert and oriented X 3. Psych:  Good affect, responds appropriately  TELEMETRY: paced rhythm, HR 72 bpm  ASSESSMENT AND PLAN: Patient feeling better overall. Covid positive 07/31/21, completed remdesivir 08/04/21. No TEE at this time due to Covid positive. Repeat blood cultures pending, no growth at 4 days. Recommend repeating Covid test, if negative, will plan for TEE.  Will continue to follow.  Principal Problem:   Dehydration, moderate Active Problems:   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HCC)   Acute dehydration   COVID-19 virus infection   Emphysema lung (HCC)   Acute on chronic combined systolic and diastolic CHF (congestive heart failure) (HCC)    Brewer Hitchman, FNP-C 08/07/2021 8:32 AM

## 2021-08-07 NOTE — Progress Notes (Addendum)
Patient received prescribed IV antibiotic and is preparing to discharge with transportation provided by spouse to home.  Patient received AVS with education provided by primary RN to patient and spouse at bedside with both parties verbalizing understanding of information and all questions and concerns addressed by primary RN.  PIV removed prior to discharge awaiting delivery of 3-in-1 BSC to patient's room before discharge.  1728: Patient and spouse transported off the unit via wheelchair by primary RN and NT.  Patient received and sent home with 3-in-1 BSC.

## 2021-08-07 NOTE — Discharge Instructions (Addendum)
If need to cancel antibiotic infusion at same day surgery, please call [phone] to reschedule. Your next appointment there is 1/12 @ 10:00 AM

## 2021-08-07 NOTE — Discharge Summary (Signed)
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  NA 132* 129* 130* 130* 133*  K 4.1 4.3 3.8 4.2 4.1  CL 99 100 100 101 97*  CO2 26 23 25 25 29   GLUCOSE 112* 111* 134* 117* 87  BUN 21 21 21 22 17   CREATININE 0.64 0.60* 0.52* 0.52* 0.54*  CALCIUM 8.3* 8.2* 8.0* 7.8* 8.0*  MG 1.7  --   --   --   --   PHOS 3.2  --   --   --   --    Liver Function Tests: Recent Labs  Lab 08/02/21 0555 08/03/21 0628  AST 23 32  ALT 13 23  ALKPHOS 60 54  BILITOT 0.5 0.4  PROT 5.6*  5.3*  ALBUMIN 3.1* 2.9*   No results for input(s): LIPASE, AMYLASE in the last 168 hours. No results for input(s): AMMONIA in the last 168 hours. CBC: Recent Labs  Lab 08/01/21 0658 08/02/21 0555 08/03/21 0628 08/07/21 0701  WBC 3.8* 4.4 4.8 8.8  NEUTROABS 2.5  --  3.3  --   HGB 11.1* 10.2* 10.2* 11.2*  HCT 32.5* 30.7* 29.8* 32.5*  MCV 99.1 99.0 97.4 96.4  PLT 142* 150 150 203   Cardiac Enzymes: No results for input(s): CKTOTAL, CKMB, CKMBINDEX, TROPONINI in the last 168 hours. BNP: BNP (last 3 results) Recent Labs    07/31/21 0413 08/01/21 0658  BNP 284.6* 599.3*    ProBNP (last 3 results) No results for input(s): PROBNP in the last 8760 hours.  CBG: Recent Labs  Lab 08/03/21 1129 08/03/21 1636  GLUCAP 150* 163*       Signed:  Desma Maxim MD.  [facility] 08/07/2021, 2:55 PM

## 2021-08-07 NOTE — Plan of Care (Signed)
°  Problem: Cardiac: Goal: Ability to achieve and maintain adequate cardiopulmonary perfusion will improve Outcome: Progressing   Problem: Clinical Measurements: Goal: Ability to maintain clinical measurements within normal limits will improve Outcome: Progressing   Problem: Clinical Measurements: Goal: Respiratory complications will improve Outcome: Progressing   Problem: Pain Managment: Goal: General experience of comfort will improve Outcome: Progressing   Problem: Elimination: Goal: Will not experience complications related to urinary retention Outcome: Progressing   Problem: Coping: Goal: Level of anxiety will decrease Outcome: Progressing

## 2021-08-07 NOTE — Progress Notes (Signed)
Pharmacy - Antimicrobial Stewardship  Patient with S. Capitis bacteremia in setting of pace make and inability to do TEE secondary to [place]. He and his wife reside in motel  without access to refrigerator so outpatient infusion at home is not possible.  (and patient also not comfortable infusing antibiotic as outpatient in this setting).    Per Dr Delaine Lame with Infectious Diseases plan to do dalbavancin as outpatient via [facility].  Patient not able to get infusion at [facility] until 10 days from positive COVID test (12/29 was first positive).    Plan: Dalbavancin 1500mg  IV x 1 prior to discharge On Jan 12th patient to get Dalbavancin 500mg  weekly at [facility]. Plan will be 3 doses as outpatient.  TEE may be performed as outpatient and potentially change this plan (ID to follow) Cephalexin 500mg  PO QID x 4 wks   I discussed with patient and wife that he will need to go to [facility] on 08/14/2021 at 10am.  They understood this information will be in discharge paperwork but I also gave them a note.  They understand where Same Day located.  Doreene Eland, PharmD, BCPS, BCIDP Work Cell: [phone] 08/07/2021 2:27 PM

## 2021-08-07 NOTE — TOC Progression Note (Addendum)
Transition of Care (TOC) - Progression Note    Patient Details  Name: Todd GRANIERI Sr. MRN: [id number] Date of Birth: 1941-05-11  Transition of Care [facility]) CM/SW Contact  Beverly Sessions, RN Phone Number: 08/07/2021, 2:34 PM  Clinical Narrative:    ID has arranged for patient to come outpatient for weekely IV antibiotics.   Per MD patient will discharge home with home health and oral antibiotics  Corene Cornea with [place] notified of discharge    Expected Discharge Plan: [place] Barriers to Discharge: Continued Medical Work up  Expected Discharge Plan and Services Expected Discharge Plan: [place] In-house Referral: Clinical Social Work   Post Acute Care Choice: [place] arrangements for the past 2 months: Hotel/Motel                 DME Arranged: 3-N-1, Bedside commode DME Agency: [employer] Date DME Agency Contacted: 08/05/21 Time DME Agency Contacted: [phone] Representative spoke with at DME Agency: Suanne Marker HH Arranged: RN, PT, OT [place] Agency: [place] ([place]) Date HH Agency Contacted: 08/05/21 Time [place]: 1033 Representative spoke with at [place]: [place] ([place]) Interventions    Readmission Risk Interventions No flowsheet data found.

## 2021-08-07 NOTE — Progress Notes (Signed)
Occupational Therapy Treatment Patient Details Name: Todd ION Sr. MRN: [id number] DOB: 12-20-1940 Today's Date: 08/07/2021   History of present illness Pt admitted for dehydration with complaints of cough. History includes HTN, COPD, GERD, RA, prostate ca, and pacemaker. Pt also positive for covid at this time.   OT comments  Chart reviewed, RN cleared pt for participation in OT tx session. Pt greeted in bed, agreeable to OT tx session. Pt performed bed mobility with SUP with HOB raised. STS from regular bed height with CGA with RW for grooming tasks completed at sink level for approx 3 minutes. Pt then reported " I need to sit down" and declined further OOB mobility. Education provided re: home safety (suggested use of a shower chair), maintaining skin integrity, role of OT/rehab. Pt would benefit from further education. Pt with fair insight into deficits throughout. VSS throughout tx session. Pt is left as received, NAD, all needs met.Recommendation has been updated to [facility] with assistance with ADLs/IADLs which he reports wife will help as needed. OT will continue to follow.    Recommendations for follow up therapy are one component of a multi-disciplinary discharge planning process, led by the attending physician.  Recommendations may be updated based on patient status, additional functional criteria and insurance authorization.    Follow Up Recommendations  Home health OT    Assistance Recommended at Discharge Frequent or constant Supervision/Assistance  Patient can return home with the following  Assistance with cooking/housework;A little help with bathing/dressing/bathroom   Equipment Recommendations  BSC/3in1;Tub/shower seat    Recommendations for Other Services      Precautions / Restrictions Precautions Precautions: Fall       Mobility Bed Mobility Overal bed mobility: Needs Assistance Bed Mobility: Supine to Sit;Sit to Supine     Supine to sit: Supervision Sit to  supine: Supervision        Transfers Overall transfer level: Needs assistance Equipment used: Rolling walker (2 wheels) Transfers: Sit to/from Stand;Bed to chair/wheelchair/BSC Sit to Stand: Supervision Stand pivot transfers: Supervision         General transfer comment: with RW     Balance Overall balance assessment: Needs assistance Sitting-balance support: No upper extremity supported;Feet supported Sitting balance-Leahy Scale: Good     Standing balance support: Single extremity supported;During functional activity Standing balance-Leahy Scale: Fair                             ADL either performed or assessed with clinical judgement   ADL Overall ADL's : Needs assistance/impaired     Grooming: Wash/dry hands;Wash/dry face;Standing;Supervision/safety;Min guard;Sitting Grooming Details (indicate cue type and reason): SUP-CGA for grooming tasks in standing at sink level; Pt able to stand for approx 3 minutes before reporting fatigue and requiring to sit;         Upper Body Dressing : Supervision/safety;Set up Upper Body Dressing Details (indicate cue type and reason): hospital gown at [facility]     Toilet Transfer: Min guard;Rolling walker (2 wheels) Toilet Transfer Details (indicate cue type and reason): simulated Toileting- Clothing Manipulation and Hygiene: Total assistance Toileting - Clothing Manipulation Details (indicate cue type and reason): pt with male pure wik- stating he will not use other devices for toileting     Functional mobility during ADLs: Supervision/safety;Min guard General ADL Comments: with with fatigue requiring rest breaks; declined further bathing tasks despite multi modal cueing; educated on safety while completing ADL tasks- using shower chair  Extremity/Trunk Assessment Upper Extremity Assessment Upper Extremity Assessment: Generalized weakness   Lower Extremity Assessment Lower Extremity Assessment: Generalized weakness         Vision       Perception     Praxis      Cognition Arousal/Alertness: Awake/alert Behavior During Therapy: WFL for tasks assessed/performed Overall Cognitive Status: Within Functional Limits for tasks assessed                                 General Comments: fair overall direction following; fair insight into deficits          Exercises Other Exercises Other Exercises: education re: role of OT, role of rehab, importance of OOB mobility, home safety; pt would benefit from ongoing education   Shoulder Instructions       General Comments SPO2 >95% throughout; pt with dry skin throughout    Pertinent Vitals/ Pain       Pain Assessment: No/denies pain  Home Living                                          Prior Functioning/Environment              Frequency  Min 3X/week        Progress Toward Goals  OT Goals(current goals can now be found in the care plan section)  Progress towards OT goals: Progressing toward goals  Acute Rehab OT Goals Patient Stated Goal: to go home OT Goal Formulation: With patient Time For Goal Achievement: 08/21/21 Potential to Achieve Goals: Good  Plan Discharge plan needs to be updated    Co-evaluation                 AM-PAC OT "6 Clicks" Daily Activity     Outcome Measure   Help from another person eating meals?: None Help from another person taking care of personal grooming?: A Little Help from another person toileting, which includes using toliet, bedpan, or urinal?: A Lot Help from another person bathing (including washing, rinsing, drying)?: A Little Help from another person to put on and taking off regular upper body clothing?: A Little Help from another person to put on and taking off regular lower body clothing?: A Little 6 Click Score: 18    End of Session Equipment Utilized During Treatment: Rolling walker (2 wheels)  OT Visit Diagnosis: Unsteadiness on feet  (R26.81)   Activity Tolerance Patient tolerated treatment well   Patient Left in bed;with call bell/phone within reach;with bed alarm set   Nurse Communication Mobility status        Time: 1696-7893 OT Time Calculation (min): 20 min  Charges: OT General Charges $OT Visit: 1 Visit OT Treatments $Self Care/Home Management : 8-22 mins  Shanon Payor, OTD OTR/L  08/07/21, 12:21 PM

## 2021-08-07 NOTE — Progress Notes (Signed)
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  Ongoing  GOAL:   Patient will meet greater than or equal to 90% of their needs  Progressing   MONITOR:   PO intake, Supplement acceptance, Labs, Weight trends, Skin, I & O's  REASON FOR ASSESSMENT:   Consult Assessment of nutrition requirement/status, Poor PO  ASSESSMENT:   Maggie Dworkin. is a 81 y.o. male with medical history significant of hypertension, COPD, GERD, prostate cancer, and complete heart block status post pacemaker . By report he has had increased SOB/DOE, weakness-limited mobility and cough for the past 4-5 days. Due to his symptoms he presents to [facility] for evaluation. He denied N/V,D, urinary symptoms, sore throat. He does continue to smoke.  Reviewed I/O's: -2.1 L x 24 hours and -5.4 L since admission  UOP: 3 L x 24 hours   Pt unavailable at time of visit. Attempted to speak with pt via call to hospital room phone, however, unable to reach.   Intake has improved since last visit. Noted meal completions 50-100%. Pt is consuming Ensure supplements and likes Magic cups.   Reviewed wt; wt has increased since admission, which is favorable given pt's malnutrition.   Medications reviewed.    Labs reviewed: Na: 133, CBGS: 163 (inpatient orders for glycemic control are none).    Diet Order:   Diet Order             Diet - low sodium heart healthy           Diet regular Room service appropriate? Yes with Assist; Fluid  consistency: Thin  Diet effective now                   EDUCATION NEEDS:   Education needs have been addressed  Skin:  Skin Assessment: Reviewed RN Assessment  Last BM:  08/02/21  Height:   Ht Readings from Last 1 Encounters:  07/31/21 6' (1.829 m)    Weight:   Wt Readings from Last 1 Encounters:  08/07/21 58.6 kg    Ideal Body Weight:  80.9 kg  BMI:  Body mass index is 17.52 kg/m.  Estimated Nutritional Needs:   Kcal:  2150-2350  Protein:  105-120 grams  Fluid:  > 2 L    Loistine Chance, RD, LDN, [place] Registered Dietitian II Certified Diabetes Care and Education Specialist Please refer to [facility] for RD and/or RD on-call/weekend/after hours pager

## 2021-08-08 ENCOUNTER — Other Ambulatory Visit (HOSPITAL_COMMUNITY): Payer: Self-pay

## 2021-08-08 LAB — CULTURE, BLOOD (ROUTINE X 2)
Culture: NO GROWTH
Culture: NO GROWTH

## 2021-08-14 ENCOUNTER — Ambulatory Visit
Admit: 2021-08-14 | Discharge: 2021-08-14 | Disposition: A | Discharge status: Home / Self Care | Payer: Medicare Other | Attending: Infectious Diseases | Admitting: Infectious Diseases

## 2021-08-14 ENCOUNTER — Ambulatory Visit (HOSPITAL_BASED_OUTPATIENT_CLINIC_OR_DEPARTMENT_OTHER): Payer: Medicare Other | Admitting: Family

## 2021-08-14 ENCOUNTER — Encounter: Payer: Self-pay | Admitting: Family

## 2021-08-14 ENCOUNTER — Other Ambulatory Visit: Payer: Self-pay

## 2021-08-14 VITALS — BP 96/61 | HR 75 | Resp 16 | Ht 70.0 in | Wt 121.1 lb

## 2021-08-14 DIAGNOSIS — R059 Cough, unspecified: Secondary | ICD-10-CM | POA: Insufficient documentation

## 2021-08-14 DIAGNOSIS — N189 Chronic kidney disease, unspecified: Secondary | ICD-10-CM | POA: Insufficient documentation

## 2021-08-14 DIAGNOSIS — I10 Essential (primary) hypertension: Secondary | ICD-10-CM | POA: Diagnosis not present

## 2021-08-14 DIAGNOSIS — I130 Hypertensive heart and chronic kidney disease with heart failure and stage 1 through stage 4 chronic kidney disease, or unspecified chronic kidney disease: Secondary | ICD-10-CM | POA: Insufficient documentation

## 2021-08-14 DIAGNOSIS — Z8616 Personal history of COVID-19: Secondary | ICD-10-CM | POA: Insufficient documentation

## 2021-08-14 DIAGNOSIS — Z8546 Personal history of malignant neoplasm of prostate: Secondary | ICD-10-CM | POA: Insufficient documentation

## 2021-08-14 DIAGNOSIS — Z5901 Sheltered homelessness: Secondary | ICD-10-CM | POA: Insufficient documentation

## 2021-08-14 DIAGNOSIS — I442 Atrioventricular block, complete: Secondary | ICD-10-CM | POA: Diagnosis not present

## 2021-08-14 DIAGNOSIS — K219 Gastro-esophageal reflux disease without esophagitis: Secondary | ICD-10-CM | POA: Diagnosis not present

## 2021-08-14 DIAGNOSIS — J449 Chronic obstructive pulmonary disease, unspecified: Secondary | ICD-10-CM | POA: Insufficient documentation

## 2021-08-14 DIAGNOSIS — Z7951 Long term (current) use of inhaled steroids: Secondary | ICD-10-CM | POA: Diagnosis not present

## 2021-08-14 DIAGNOSIS — Z87891 Personal history of nicotine dependence: Secondary | ICD-10-CM | POA: Insufficient documentation

## 2021-08-14 DIAGNOSIS — Z950 Presence of cardiac pacemaker: Secondary | ICD-10-CM | POA: Diagnosis not present

## 2021-08-14 DIAGNOSIS — I5032 Chronic diastolic (congestive) heart failure: Secondary | ICD-10-CM | POA: Diagnosis not present

## 2021-08-14 DIAGNOSIS — I252 Old myocardial infarction: Secondary | ICD-10-CM | POA: Diagnosis not present

## 2021-08-14 DIAGNOSIS — I2510 Atherosclerotic heart disease of native coronary artery without angina pectoris: Secondary | ICD-10-CM | POA: Diagnosis not present

## 2021-08-14 LAB — CBC WITH DIFFERENTIAL/PLATELET
Abs Immature Granulocytes: 0.02 10*3/uL (ref 0.00–0.07)
Basophils Absolute: 0 10*3/uL (ref 0.0–0.1)
Basophils Relative: 1 %
Eosinophils Absolute: 0.1 10*3/uL (ref 0.0–0.5)
Eosinophils Relative: 2 %
HCT: 31.6 % — ABNORMAL LOW (ref 39.0–52.0)
Hemoglobin: 10.5 g/dL — ABNORMAL LOW (ref 13.0–17.0)
Immature Granulocytes: 0 %
Lymphocytes Relative: 27 %
Lymphs Abs: 1.5 10*3/uL (ref 0.7–4.0)
MCH: 33.3 pg (ref 26.0–34.0)
MCHC: 33.2 g/dL (ref 30.0–36.0)
MCV: 100.3 fL — ABNORMAL HIGH (ref 80.0–100.0)
Monocytes Absolute: 0.5 10*3/uL (ref 0.1–1.0)
Monocytes Relative: 9 %
Neutro Abs: 3.6 10*3/uL (ref 1.7–7.7)
Neutrophils Relative %: 61 %
Platelets: 216 10*3/uL (ref 150–400)
RBC: 3.15 MIL/uL — ABNORMAL LOW (ref 4.22–5.81)
RDW: 13.8 % (ref 11.5–15.5)
WBC: 5.8 10*3/uL (ref 4.0–10.5)
nRBC: 0 % (ref 0.0–0.2)

## 2021-08-14 LAB — COMPREHENSIVE METABOLIC PANEL
ALT: 13 U/L (ref 0–44)
AST: 18 U/L (ref 15–41)
Albumin: 3.1 g/dL — ABNORMAL LOW (ref 3.5–5.0)
Alkaline Phosphatase: 73 U/L (ref 38–126)
Anion gap: 5 (ref 5–15)
BUN: 14 mg/dL (ref 8–23)
CO2: 26 mmol/L (ref 22–32)
Calcium: 8.4 mg/dL — ABNORMAL LOW (ref 8.9–10.3)
Chloride: 104 mmol/L (ref 98–111)
Creatinine, Ser: 0.62 mg/dL (ref 0.61–1.24)
GFR, Estimated: >60 mL/min (ref >60)
Glucose, Bld: 136 mg/dL — ABNORMAL HIGH (ref 70–99)
Potassium: 3.5 mmol/L (ref 3.5–5.1)
Sodium: 135 mmol/L (ref 135–145)
Total Bilirubin: 0.5 mg/dL (ref 0.3–1.2)
Total Protein: 6.4 g/dL — ABNORMAL LOW (ref 6.5–8.1)

## 2021-08-14 MED ORDER — DEXTROSE 5 % IV SOLN
500.0000 mg | INTRAVENOUS | Status: DC
  Administered 2021-08-14: 500 mg via INTRAVENOUS
  Filled 2021-08-14: qty 25

## 2021-08-14 MED ORDER — SODIUM CHLORIDE FLUSH 0.9 % IV SOLN
INTRAVENOUS | Status: AC
  Filled 2021-08-14: qty 10
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
## 2021-08-14 NOTE — Patient Instructions (Addendum)
Begin weighing daily and call for an overnight weight gain of 3 pounds or more or a weekly weight gain of more than 5 pounds.  

## 2021-08-25 ENCOUNTER — Other Ambulatory Visit: Payer: Self-pay

## 2021-08-25 ENCOUNTER — Ambulatory Visit
Admission: RE | Admit: 2021-08-25 | Discharge: 2021-08-25 | Disposition: A | Discharge status: Home / Self Care | Payer: Medicare Other | Source: Ambulatory Visit | Attending: Infectious Diseases | Admitting: Infectious Diseases

## 2021-08-25 VITALS — BP 130/54 | HR 73 | Temp 97.1°F | Resp 18

## 2021-08-25 DIAGNOSIS — R7881 Bacteremia: Secondary | ICD-10-CM | POA: Diagnosis present

## 2021-08-25 LAB — COMPREHENSIVE METABOLIC PANEL
ALT: 13 U/L (ref 0–44)
AST: 22 U/L (ref 15–41)
Albumin: 3.3 g/dL — ABNORMAL LOW (ref 3.5–5.0)
Alkaline Phosphatase: 90 U/L (ref 38–126)
Anion gap: 10 (ref 5–15)
BUN: 16 mg/dL (ref 8–23)
CO2: 26 mmol/L (ref 22–32)
Calcium: 8.9 mg/dL (ref 8.9–10.3)
Chloride: 102 mmol/L (ref 98–111)
Creatinine, Ser: 0.67 mg/dL (ref 0.61–1.24)
GFR, Estimated: >60 mL/min (ref >60)
Glucose, Bld: 167 mg/dL — ABNORMAL HIGH (ref 70–99)
Potassium: 3.8 mmol/L (ref 3.5–5.1)
Sodium: 138 mmol/L (ref 135–145)
Total Bilirubin: 0.4 mg/dL (ref 0.3–1.2)
Total Protein: 6.8 g/dL (ref 6.5–8.1)

## 2021-08-25 LAB — CBC WITH DIFFERENTIAL/PLATELET
Abs Immature Granulocytes: 0.01 10*3/uL (ref 0.00–0.07)
Basophils Absolute: 0.1 10*3/uL (ref 0.0–0.1)
Basophils Relative: 1 %
Eosinophils Absolute: 0.2 10*3/uL (ref 0.0–0.5)
Eosinophils Relative: 5 %
HCT: 33.6 % — ABNORMAL LOW (ref 39.0–52.0)
Hemoglobin: 11.3 g/dL — ABNORMAL LOW (ref 13.0–17.0)
Immature Granulocytes: 0 %
Lymphocytes Relative: 30 %
Lymphs Abs: 1.3 10*3/uL (ref 0.7–4.0)
MCH: 33.4 pg (ref 26.0–34.0)
MCHC: 33.6 g/dL (ref 30.0–36.0)
MCV: 99.4 fL (ref 80.0–100.0)
Monocytes Absolute: 0.6 10*3/uL (ref 0.1–1.0)
Monocytes Relative: 14 %
Neutro Abs: 2.2 10*3/uL (ref 1.7–7.7)
Neutrophils Relative %: 50 %
Platelets: 275 10*3/uL (ref 150–400)
RBC: 3.38 MIL/uL — ABNORMAL LOW (ref 4.22–5.81)
RDW: 13.4 % (ref 11.5–15.5)
WBC: 4.4 10*3/uL (ref 4.0–10.5)
nRBC: 0 % (ref 0.0–0.2)

## 2021-08-25 MED ORDER — DEXTROSE 5 % IV SOLN
500.0000 mg | INTRAVENOUS | Status: DC
  Administered 2021-08-25: 500 mg via INTRAVENOUS
  Filled 2021-08-25: qty 25

## 2021-08-26 ENCOUNTER — Ambulatory Visit: Payer: Medicare Other | Attending: Infectious Diseases | Admitting: Infectious Diseases

## 2021-08-26 VITALS — BP 109/68 | HR 72 | Resp 16 | Ht 70.0 in | Wt 121.3 lb

## 2021-08-26 DIAGNOSIS — B957 Other staphylococcus as the cause of diseases classified elsewhere: Secondary | ICD-10-CM

## 2021-08-26 DIAGNOSIS — R531 Weakness: Secondary | ICD-10-CM | POA: Diagnosis not present

## 2021-08-26 DIAGNOSIS — Z8546 Personal history of malignant neoplasm of prostate: Secondary | ICD-10-CM | POA: Insufficient documentation

## 2021-08-26 DIAGNOSIS — J449 Chronic obstructive pulmonary disease, unspecified: Secondary | ICD-10-CM | POA: Diagnosis not present

## 2021-08-26 DIAGNOSIS — Z950 Presence of cardiac pacemaker: Secondary | ICD-10-CM | POA: Diagnosis not present

## 2021-08-26 DIAGNOSIS — Z8616 Personal history of COVID-19: Secondary | ICD-10-CM | POA: Insufficient documentation

## 2021-08-26 DIAGNOSIS — N189 Chronic kidney disease, unspecified: Secondary | ICD-10-CM | POA: Diagnosis not present

## 2021-08-26 DIAGNOSIS — K219 Gastro-esophageal reflux disease without esophagitis: Secondary | ICD-10-CM | POA: Diagnosis not present

## 2021-08-26 DIAGNOSIS — Z87891 Personal history of nicotine dependence: Secondary | ICD-10-CM | POA: Insufficient documentation

## 2021-08-26 DIAGNOSIS — I442 Atrioventricular block, complete: Secondary | ICD-10-CM | POA: Diagnosis not present

## 2021-08-26 DIAGNOSIS — I509 Heart failure, unspecified: Secondary | ICD-10-CM | POA: Insufficient documentation

## 2021-08-26 DIAGNOSIS — R7881 Bacteremia: Secondary | ICD-10-CM | POA: Insufficient documentation

## 2021-08-26 DIAGNOSIS — I130 Hypertensive heart and chronic kidney disease with heart failure and stage 1 through stage 4 chronic kidney disease, or unspecified chronic kidney disease: Secondary | ICD-10-CM | POA: Insufficient documentation

## 2021-08-26 NOTE — Patient Instructions (Signed)
You are here for follow up after recent hospitalization for bacteremia and you are getting dalbavancin IV- once a week- last dose is 09/01/21- Your labs yesterday were fine except for blood glucose of 167.
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
## 2021-09-01 ENCOUNTER — Ambulatory Visit
Admission: RE | Admit: 2021-09-01 | Discharge: 2021-09-01 | Disposition: A | Discharge status: Home / Self Care | Payer: Medicare Other | Source: Ambulatory Visit | Attending: Infectious Diseases | Admitting: Infectious Diseases

## 2021-09-01 DIAGNOSIS — L0390 Cellulitis, unspecified: Secondary | ICD-10-CM | POA: Diagnosis present

## 2021-09-01 MED ORDER — DEXTROSE 5 % IV SOLN
500.0000 mg | Freq: Once | INTRAVENOUS | Status: AC
  Administered 2021-09-01: 500 mg via INTRAVENOUS
  Filled 2021-09-01: qty 25

## 2021-09-08 ENCOUNTER — Other Ambulatory Visit: Payer: Self-pay | Admitting: *Deleted

## 2021-09-08 DIAGNOSIS — C61 Malignant neoplasm of prostate: Secondary | ICD-10-CM

## 2021-09-19 ENCOUNTER — Other Ambulatory Visit: Payer: Self-pay

## 2021-09-19 ENCOUNTER — Inpatient Hospital Stay: Payer: Medicare Other | Attending: Radiation Oncology

## 2021-09-19 DIAGNOSIS — C61 Malignant neoplasm of prostate: Secondary | ICD-10-CM | POA: Diagnosis present

## 2021-09-19 LAB — PSA: Prostatic Specific Antigen: 0.33 ng/mL (ref 0.00–4.00)

## 2021-09-22 ENCOUNTER — Ambulatory Visit
Admission: RE | Admit: 2021-09-22 | Discharge: 2021-09-22 | Disposition: A | Discharge status: Home / Self Care | Payer: Medicare Other | Source: Ambulatory Visit | Attending: Radiation Oncology | Admitting: Radiation Oncology

## 2021-09-22 ENCOUNTER — Encounter: Payer: Self-pay | Admitting: Radiation Oncology

## 2021-09-22 ENCOUNTER — Other Ambulatory Visit: Payer: Self-pay

## 2021-09-22 VITALS — BP 108/59 | HR 88 | Temp 97.0°F | Resp 16 | Ht 72.0 in

## 2021-09-22 DIAGNOSIS — Z923 Personal history of irradiation: Secondary | ICD-10-CM | POA: Diagnosis not present

## 2021-09-22 DIAGNOSIS — C61 Malignant neoplasm of prostate: Secondary | ICD-10-CM | POA: Diagnosis not present

## 2021-09-22 NOTE — Progress Notes (Signed)
Radiation Oncology Follow up Note  Name: Todd Webster.   Date:   09/22/2021 MRN:  [id number] DOB: 1940-12-24    This 81 y.o. male presents to the clinic today for 34-month follow-up status post IMRT radiation therapy for a stage II adenocarcinoma the prostate presenting with a PSA of 10.  REFERRING PROVIDER: Jodi Marble, MD  HPI: Patient is a 81 year old male now out 16 months having completed IMRT radiation therapy to his prostate for Gleason 7 mostly (3+4) adenocarcinoma.  Seen today in routine follow-up he is doing well.  He uses a urinal at night.  And is currently on Flomax.  He is having no GI symptoms.  His most recent PSA is 0.33 slightly up from 0.266 months prior.  COMPLICATIONS OF TREATMENT: none  FOLLOW UP COMPLIANCE: keeps appointments   PHYSICAL EXAM:  BP (!) 108/59    Pulse 88    Temp (!) 97 F (36.1 C)    Resp 16    Ht 6' (1.829 m)    BMI 16.44 kg/m  Wheelchair-bound male in NAD.  Well-developed well-nourished patient in NAD. HEENT reveals PERLA, EOMI, discs not visualized.  Oral cavity is clear. No oral mucosal lesions are identified. Neck is clear without evidence of cervical or supraclavicular adenopathy. Lungs are clear to A&P. Cardiac examination is essentially unremarkable with regular rate and rhythm without murmur rub or thrill. Abdomen is benign with no organomegaly or masses noted. Motor sensory and DTR levels are equal and symmetric in the upper and lower extremities. Cranial nerves II through XII are grossly intact. Proprioception is intact. No peripheral adenopathy or edema is identified. No motor or sensory levels are noted. Crude visual fields are within normal range.  RADIOLOGY RESULTS: No current films for review  PLAN: Present time patient continues to do well under excellent biochemical control of his prostate cancer.  I have asked to see him back in 6 months for follow-up with a PSA prior to that visit.  Patient is to call with any concerns.  I  would like to take this opportunity to thank you for allowing me to participate in the care of your patient.Noreene Filbert, MD

## 2021-09-23 ENCOUNTER — Encounter: Payer: Self-pay | Admitting: Family

## 2021-09-23 ENCOUNTER — Telehealth: Payer: Self-pay

## 2021-09-23 ENCOUNTER — Ambulatory Visit: Payer: Medicare Other | Attending: Family | Admitting: Family

## 2021-09-23 VITALS — BP 128/106 | HR 73 | Resp 18 | Ht 72.0 in | Wt 119.3 lb

## 2021-09-23 DIAGNOSIS — N189 Chronic kidney disease, unspecified: Secondary | ICD-10-CM | POA: Insufficient documentation

## 2021-09-23 DIAGNOSIS — R5383 Other fatigue: Secondary | ICD-10-CM | POA: Insufficient documentation

## 2021-09-23 DIAGNOSIS — Z09 Encounter for follow-up examination after completed treatment for conditions other than malignant neoplasm: Secondary | ICD-10-CM | POA: Diagnosis not present

## 2021-09-23 DIAGNOSIS — I10 Essential (primary) hypertension: Secondary | ICD-10-CM | POA: Diagnosis not present

## 2021-09-23 DIAGNOSIS — Z950 Presence of cardiac pacemaker: Secondary | ICD-10-CM | POA: Diagnosis not present

## 2021-09-23 DIAGNOSIS — I5032 Chronic diastolic (congestive) heart failure: Secondary | ICD-10-CM | POA: Diagnosis not present

## 2021-09-23 DIAGNOSIS — K219 Gastro-esophageal reflux disease without esophagitis: Secondary | ICD-10-CM | POA: Insufficient documentation

## 2021-09-23 DIAGNOSIS — I252 Old myocardial infarction: Secondary | ICD-10-CM | POA: Insufficient documentation

## 2021-09-23 DIAGNOSIS — I130 Hypertensive heart and chronic kidney disease with heart failure and stage 1 through stage 4 chronic kidney disease, or unspecified chronic kidney disease: Secondary | ICD-10-CM | POA: Insufficient documentation

## 2021-09-23 DIAGNOSIS — R0602 Shortness of breath: Secondary | ICD-10-CM | POA: Insufficient documentation

## 2021-09-23 DIAGNOSIS — Z87891 Personal history of nicotine dependence: Secondary | ICD-10-CM | POA: Diagnosis not present

## 2021-09-23 DIAGNOSIS — I2510 Atherosclerotic heart disease of native coronary artery without angina pectoris: Secondary | ICD-10-CM | POA: Diagnosis not present

## 2021-09-23 DIAGNOSIS — J449 Chronic obstructive pulmonary disease, unspecified: Secondary | ICD-10-CM | POA: Insufficient documentation

## 2021-09-23 DIAGNOSIS — R079 Chest pain, unspecified: Secondary | ICD-10-CM | POA: Insufficient documentation

## 2021-09-23 NOTE — Telephone Encounter (Signed)
Attempted to contact patient/patient's wife  Todd Webster to schedule a Palliative Care consult appointment. No answer left a message to return call.

## 2021-09-23 NOTE — Patient Instructions (Signed)
A referral for palliative care has been sent in.  Ask your physical therapist about your concerns with a transport wheelchair.  Return in 2 months.  Call if you need [place] before then for weight gain/swelling.
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
## 2021-09-24 ENCOUNTER — Telehealth: Payer: Self-pay

## 2021-09-24 NOTE — Telephone Encounter (Signed)
Attempted to contact patient' wife Tamela Oddi to schedule a Palliative Care consult appointment. No answer left a message to return call.

## 2021-09-29 ENCOUNTER — Telehealth: Payer: Self-pay | Admitting: Student

## 2021-09-29 NOTE — Telephone Encounter (Signed)
Rec'd call from patient's wife Tamela Oddi and have scheduled an [place] for 10/07/21 @ 2:30 PM

## 2021-10-03 ENCOUNTER — Telehealth: Payer: Self-pay

## 2021-10-03 NOTE — Telephone Encounter (Signed)
353 pm.  Message received to contact wife regarding Palliative Care visit next week.  Return call made to Todd Webster and confirmed visit for 3/7 @ 230 and this will be a telephonic visit.   ?

## 2021-10-07 ENCOUNTER — Other Ambulatory Visit: Payer: Self-pay

## 2021-10-07 ENCOUNTER — Other Ambulatory Visit: Payer: Self-pay | Admitting: Student

## 2021-10-07 DIAGNOSIS — Z515 Encounter for palliative care: Secondary | ICD-10-CM

## 2021-10-07 DIAGNOSIS — I5032 Chronic diastolic (congestive) heart failure: Secondary | ICD-10-CM

## 2021-10-07 DIAGNOSIS — J439 Emphysema, unspecified: Secondary | ICD-10-CM

## 2021-10-07 DIAGNOSIS — C61 Malignant neoplasm of prostate: Secondary | ICD-10-CM

## 2021-10-07 DIAGNOSIS — R52 Pain, unspecified: Secondary | ICD-10-CM
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
## 2021-10-16 ENCOUNTER — Telehealth: Payer: Self-pay

## 2021-10-16 NOTE — Telephone Encounter (Signed)
PC SW outreached patient and spouse per [facility] NP - [place], [place] referral request to assess patient needs. ? ?Call unsuccessful. SW LVM with contact info. Awaiting return call.  ?

## 2021-11-21 ENCOUNTER — Ambulatory Visit: Payer: Medicare Other | Attending: Family | Admitting: Family

## 2021-11-21 ENCOUNTER — Encounter: Payer: Self-pay | Admitting: Family

## 2021-11-21 VITALS — BP 119/63 | HR 73 | Resp 16 | Ht 72.0 in | Wt 120.2 lb

## 2021-11-21 DIAGNOSIS — Z8679 Personal history of other diseases of the circulatory system: Secondary | ICD-10-CM | POA: Insufficient documentation

## 2021-11-21 DIAGNOSIS — I130 Hypertensive heart and chronic kidney disease with heart failure and stage 1 through stage 4 chronic kidney disease, or unspecified chronic kidney disease: Secondary | ICD-10-CM | POA: Insufficient documentation

## 2021-11-21 DIAGNOSIS — K219 Gastro-esophageal reflux disease without esophagitis: Secondary | ICD-10-CM | POA: Diagnosis not present

## 2021-11-21 DIAGNOSIS — N189 Chronic kidney disease, unspecified: Secondary | ICD-10-CM | POA: Insufficient documentation

## 2021-11-21 DIAGNOSIS — J449 Chronic obstructive pulmonary disease, unspecified: Secondary | ICD-10-CM | POA: Diagnosis not present

## 2021-11-21 DIAGNOSIS — Z79899 Other long term (current) drug therapy: Secondary | ICD-10-CM | POA: Diagnosis not present

## 2021-11-21 DIAGNOSIS — Z950 Presence of cardiac pacemaker: Secondary | ICD-10-CM | POA: Diagnosis not present

## 2021-11-21 DIAGNOSIS — I252 Old myocardial infarction: Secondary | ICD-10-CM | POA: Diagnosis not present

## 2021-11-21 DIAGNOSIS — I2510 Atherosclerotic heart disease of native coronary artery without angina pectoris: Secondary | ICD-10-CM | POA: Diagnosis not present

## 2021-11-21 DIAGNOSIS — H9190 Unspecified hearing loss, unspecified ear: Secondary | ICD-10-CM | POA: Diagnosis not present

## 2021-11-21 DIAGNOSIS — K759 Inflammatory liver disease, unspecified: Secondary | ICD-10-CM | POA: Insufficient documentation

## 2021-11-21 DIAGNOSIS — I10 Essential (primary) hypertension: Secondary | ICD-10-CM | POA: Diagnosis not present

## 2021-11-21 DIAGNOSIS — Z7951 Long term (current) use of inhaled steroids: Secondary | ICD-10-CM | POA: Diagnosis not present

## 2021-11-21 DIAGNOSIS — F17210 Nicotine dependence, cigarettes, uncomplicated: Secondary | ICD-10-CM | POA: Insufficient documentation

## 2021-11-21 DIAGNOSIS — I5032 Chronic diastolic (congestive) heart failure: Secondary | ICD-10-CM | POA: Diagnosis present

## 2021-11-21 DIAGNOSIS — Z8546 Personal history of malignant neoplasm of prostate: Secondary | ICD-10-CM | POA: Diagnosis not present

## 2021-11-21 DIAGNOSIS — M898X9 Other specified disorders of bone, unspecified site: Secondary | ICD-10-CM | POA: Diagnosis not present

## 2021-11-21 NOTE — Patient Instructions (Addendum)
Continue weighing daily and call for an overnight weight gain of 3 pounds or more or a weekly weight gain of more than 5 pounds. ? ? ?If you have voicemail, please make sure your mailbox is cleaned out so that we may leave a message and please make sure to listen to any voicemails.  ? ? ?Call the social work from palliative care (Asia Henrene Pastor) at [phone] ?
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
## 2021-11-24 ENCOUNTER — Telehealth: Payer: Self-pay | Admitting: Family

## 2021-11-24 NOTE — Telephone Encounter (Signed)
Returned patients call as they left a message stating they needed Gabriel Carina with Pallative phone number and we accidentally gave her the wrong number I returned patients call with the correct Information for [facility]. ? ? ?Aniket Paye, NT ?

## 2022-02-19 ENCOUNTER — Telehealth: Payer: Self-pay

## 2022-02-19 NOTE — Telephone Encounter (Signed)
1130 am. Phone call made to patient to follow up on overall status and offer a home visit with Palliative Care NP.  No answer.  Message left requesting a call back.

## 2022-03-13 ENCOUNTER — Other Ambulatory Visit: Payer: Self-pay | Admitting: *Deleted

## 2022-03-13 DIAGNOSIS — C61 Malignant neoplasm of prostate: Secondary | ICD-10-CM

## 2022-03-19 ENCOUNTER — Inpatient Hospital Stay: Payer: Medicare Other | Attending: Radiation Oncology

## 2022-03-19 DIAGNOSIS — Z923 Personal history of irradiation: Secondary | ICD-10-CM | POA: Diagnosis not present

## 2022-03-19 DIAGNOSIS — C61 Malignant neoplasm of prostate: Secondary | ICD-10-CM | POA: Diagnosis not present

## 2022-03-19 LAB — PSA: Prostatic Specific Antigen: 0.23 ng/mL (ref 0.00–4.00)

## 2022-03-26 ENCOUNTER — Telehealth: Payer: Self-pay | Admitting: *Deleted

## 2022-03-26 ENCOUNTER — Ambulatory Visit
Admission: RE | Admit: 2022-03-26 | Discharge: 2022-03-26 | Disposition: A | Discharge status: Home / Self Care | Payer: Medicare Other | Source: Ambulatory Visit | Attending: Radiation Oncology | Admitting: Radiation Oncology

## 2022-03-26 VITALS — BP 91/53 | HR 70 | Temp 98.3°F | Resp 18 | Ht 72.0 in

## 2022-03-26 DIAGNOSIS — C61 Malignant neoplasm of prostate: Secondary | ICD-10-CM

## 2022-03-26 NOTE — Telephone Encounter (Signed)
Msg received from Maudie Mercury, [place] in radiation dept - requesting pt to see smc provider today due to weakness and dizziness. Rn reported pt is not hydrating well at home.  Pt is not on any active treatment. This apt was for his 2 yr f/u for prostate cancer. Per Ander Purpura, NP- "I just reviewed his chart and looks like his weakness and dizziness isn't related to his prostate cancer. I'd recommend cardiology or pcp. Would be reluctant to give fluids with his hx of chf"

## 2022-03-26 NOTE — Progress Notes (Signed)
Radiation Oncology Follow up Note  Name: Todd Webster.   Date:   03/26/2022 MRN:  [id number] DOB: 1941-01-16    This 81 y.o. male presents to the clinic today for 2-year follow-up status post IMRT ration therapy for stage II adenocarcinoma prostate presenting with a PSA of 10.  REFERRING PROVIDER: Jodi Marble, MD  HPI: Patient is a 81 year old male now out 2 years having completed IMRT radiation therapy to his prostate for Gleason 7 adenocarcinoma presenting with a PSA of 10 seen today in routine follow-up he does have some nocturia x4.  He has been followed with urology with another follow-up appointment in November.  He specifically denies diarrhea.  His most recent PSA is.  0.23 down from 0.336 months prior.  COMPLICATIONS OF TREATMENT: none  FOLLOW UP COMPLIANCE: keeps appointments   PHYSICAL EXAM:  BP (!) 91/53   Pulse 70   Temp 98.3 F (36.8 C)   Resp 18   Ht 6' (1.829 m)   BMI 16.31 kg/m  Presents frail-appearing male wheelchair-bound in NAD.  Well-developed well-nourished patient in NAD. HEENT reveals PERLA, EOMI, discs not visualized.  Oral cavity is clear. No oral mucosal lesions are identified. Neck is clear without evidence of cervical or supraclavicular adenopathy. Lungs are clear to A&P. Cardiac examination is essentially unremarkable with regular rate and rhythm without murmur rub or thrill. Abdomen is benign with no organomegaly or masses noted. Motor sensory and DTR levels are equal and symmetric in the upper and lower extremities. Cranial nerves II through XII are grossly intact. Proprioception is intact. No peripheral adenopathy or edema is identified. No motor or sensory levels are noted. Crude visual fields are within normal range.  RADIOLOGY RESULTS: No current films to review  PLAN: Present time patient is under excellent biochemical control of his prostate cancer.  I am going to turn follow-up care over to urology based on the patient's difficulties  with transportation and overall general status.  Be happy to reevaluate the patient anytime should that be indicated.  I would like to take this opportunity to thank you for allowing me to participate in the care of your patient.Noreene Filbert, MD

## 2022-04-23 ENCOUNTER — Ambulatory Visit: Payer: Medicare Other | Admitting: Dermatology

## 2022-05-18 ENCOUNTER — Telehealth: Payer: Self-pay | Admitting: Urology

## 2022-05-18 NOTE — Telephone Encounter (Signed)
Leave msg for patient to call back to speak to [facility] for ins verification for hes upcoming CT scan.

## 2022-05-22 ENCOUNTER — Telehealth: Payer: Self-pay

## 2022-05-22 NOTE — Telephone Encounter (Signed)
Message left on triage line- [place] states she is returning a call from our office about an appt for Monday for Todd Webster.   Per appt desk Charlene left message for insurance verification for CT. Will forward to Charlene to follow up.
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
## 2022-05-25 ENCOUNTER — Encounter: Payer: Self-pay | Admitting: Family

## 2022-05-25 ENCOUNTER — Ambulatory Visit: Payer: Medicare Other | Attending: Family | Admitting: Family

## 2022-05-25 VITALS — BP 81/57 | HR 75 | Resp 16 | Ht 72.0 in | Wt 115.0 lb

## 2022-05-25 DIAGNOSIS — R0602 Shortness of breath: Secondary | ICD-10-CM | POA: Insufficient documentation

## 2022-05-25 DIAGNOSIS — I10 Essential (primary) hypertension: Secondary | ICD-10-CM | POA: Diagnosis not present

## 2022-05-25 DIAGNOSIS — R42 Dizziness and giddiness: Secondary | ICD-10-CM | POA: Insufficient documentation

## 2022-05-25 DIAGNOSIS — I130 Hypertensive heart and chronic kidney disease with heart failure and stage 1 through stage 4 chronic kidney disease, or unspecified chronic kidney disease: Secondary | ICD-10-CM | POA: Insufficient documentation

## 2022-05-25 DIAGNOSIS — I252 Old myocardial infarction: Secondary | ICD-10-CM | POA: Insufficient documentation

## 2022-05-25 DIAGNOSIS — I2510 Atherosclerotic heart disease of native coronary artery without angina pectoris: Secondary | ICD-10-CM | POA: Diagnosis not present

## 2022-05-25 DIAGNOSIS — N189 Chronic kidney disease, unspecified: Secondary | ICD-10-CM | POA: Insufficient documentation

## 2022-05-25 DIAGNOSIS — Z950 Presence of cardiac pacemaker: Secondary | ICD-10-CM | POA: Diagnosis not present

## 2022-05-25 DIAGNOSIS — R059 Cough, unspecified: Secondary | ICD-10-CM | POA: Insufficient documentation

## 2022-05-25 DIAGNOSIS — J449 Chronic obstructive pulmonary disease, unspecified: Secondary | ICD-10-CM | POA: Insufficient documentation

## 2022-05-25 DIAGNOSIS — F17210 Nicotine dependence, cigarettes, uncomplicated: Secondary | ICD-10-CM | POA: Diagnosis not present

## 2022-05-25 DIAGNOSIS — K219 Gastro-esophageal reflux disease without esophagitis: Secondary | ICD-10-CM | POA: Insufficient documentation

## 2022-05-25 DIAGNOSIS — Z716 Tobacco abuse counseling: Secondary | ICD-10-CM | POA: Insufficient documentation

## 2022-05-25 DIAGNOSIS — R079 Chest pain, unspecified: Secondary | ICD-10-CM | POA: Diagnosis not present

## 2022-05-25 DIAGNOSIS — I5032 Chronic diastolic (congestive) heart failure: Secondary | ICD-10-CM | POA: Diagnosis not present

## 2022-05-25 NOTE — Patient Instructions (Addendum)
Continue weighing daily and call for an overnight weight gain of 3 pounds or more or a weekly weight gain of more than 5 pounds.   If you have voicemail, please make sure your mailbox is cleaned out so that we may leave a message and please make sure to listen to any voicemails.    Try to drink 60-64 ounces of fluid daily   Call of [place] if you need [place] for anything.

## 2022-06-05 ENCOUNTER — Ambulatory Visit
Admission: RE | Admit: 2022-06-05 | Discharge: 2022-06-05 | Disposition: A | Discharge status: Home / Self Care | Payer: Medicare Other | Source: Ambulatory Visit | Attending: Urology | Admitting: Urology

## 2022-06-05 DIAGNOSIS — N2889 Other specified disorders of kidney and ureter: Secondary | ICD-10-CM | POA: Insufficient documentation

## 2022-06-05 LAB — POCT I-STAT CREATININE: Creatinine, Ser: 0.8 mg/dL (ref 0.61–1.24)

## 2022-06-05 MED ORDER — IOHEXOL 300 MG/ML  SOLN
80.0000 mL | Freq: Once | INTRAMUSCULAR | Status: AC | PRN
  Administered 2022-06-05: 80 mL via INTRAVENOUS

## 2022-06-09 NOTE — Telephone Encounter (Signed)
Called patient and lvm to see if patient would like to come in and see social work to help him with his living situation.   Huey Scalia, NT

## 2022-06-11 ENCOUNTER — Encounter: Payer: Self-pay | Admitting: Licensed Clinical Social Worker

## 2022-06-11 NOTE — Progress Notes (Unsigned)
Heart and Vascular Care Navigation  06/11/2022  Todd STAUFFER Sr. [birth year] [id number]  Reason for Referral: Patient and wife have been living in a motel for over a year and need housing. Patient is participating in a Managed Medicaid Plan: no  Engaged with patient face to face for initial visit for Heart and Vascular Care Coordination.                                                                                                   Assessment: Patient is an 81 yo married male with one adult son age 5. They were residing in a single family home for 12 years and the owner of the home passed away and the children sold the home. Patient, wife and son had to vacate the home and have been residing in a motel for over a year. They have no access to internet, no email and currently use a flip phone with ability to text. Wife states she has tried multiple options but struggles with the ability to complete the application process as all is online. They have 5 storage units that they are currently paying for and weekly rates at the motel so there income is depleted each month.  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 Their son does not work and currently does not have disability.  They require 3 bedrooms and 2 baths as wife states each needs their own room.                                  HRT/VAS Care Coordination     Patients Home Cardiology Office [facility]   Outpatient Care Team Social Worker   Social Worker Name: Raquel Sarna, [place] [phone]   Living arrangements for the past 2 months Hotel/Motel   Lives with: Self; Adult Children; Spouse   Patient Current Insurance Coverage Traditional Medicare   Patient Has Concern With Paying Medical Bills No   Does Patient Have Prescription Coverage? Yes   Home Assistive Devices/Equipment Eyeglasses; Dentures (specify type); Cane (specify quad or straight); Wheelchair; Environmental consultant  (specify type)   DME Agency [employer]   [place]       Social History:                                                                             [place]: Food Insecurity Present (06/11/2022)  Housing: High Risk (06/11/2022)  Transportation Needs: No Transportation Needs (06/11/2022)  Utilities: Not At Risk (06/11/2022)  Depression (PHQ2-9): Low Risk  (05/25/2022)  Financial Resource Strain: Medium Risk (06/11/2022)  Tobacco Use: High Risk (06/05/2022)  SDOH Interventions: Financial Resources:  Financial Strain Interventions: Intervention Not Indicated   Food Insecurity:  Food Insecurity Interventions: Intervention Not Indicated  Housing Insecurity:  Housing Interventions: Other (Comment) Corporate treasurer)  Transportation:   Transportation Interventions: Intervention Not Indicated   Follow-up plan: CSW contacted multiple realtors and Civil engineer, [email or web address] for possible options. The challenge is most options require an email address to even tour the home as well as need for online applications. In addition, they will be required to provide a rental reference and a motel is not sufficient as a reference and wife states they would be unable to obtain the reference from their former landlord. CSW will continue to explore some options and return call to patient and wife. CSW available as needed. Raquel Sarna, [place], [place]

## 2022-06-12 ENCOUNTER — Telehealth (HOSPITAL_COMMUNITY): Payer: Self-pay | Admitting: Licensed Clinical Social Worker

## 2022-06-12 NOTE — Telephone Encounter (Signed)
CSW contacted patient's wife to provide some possible options for housing. CSW reviewed with wife and provided the information.   [place] [phone] 3bedroom on ground floor $081 application fee ($388 administrative and $50/person)- would be credited to first months rent if approved- if denied would get back $150 admin fee  [facility] [place]- [phone] Can come in person to get help applying would need to bring ID and proof of income as well as pay $65/person and $150 admin fee  Wife grateful for the assistance and will look into these options and return call. CSW available as needed. Raquel Sarna, [place], [place]

## 2022-06-18 ENCOUNTER — Emergency Department: Payer: Medicare Other

## 2022-06-18 ENCOUNTER — Encounter: Payer: Self-pay | Admitting: Radiology

## 2022-06-18 ENCOUNTER — Emergency Department
Admission: EM | Admit: 2022-06-18 | Discharge: 2022-06-18 | Disposition: A | Discharge status: Home / Self Care | Payer: Medicare Other | Attending: Emergency Medicine | Admitting: Emergency Medicine

## 2022-06-18 ENCOUNTER — Ambulatory Visit: Payer: Medicare Other | Admitting: Urology

## 2022-06-18 ENCOUNTER — Other Ambulatory Visit: Payer: Self-pay

## 2022-06-18 DIAGNOSIS — Z1152 Encounter for screening for COVID-19: Secondary | ICD-10-CM | POA: Insufficient documentation

## 2022-06-18 DIAGNOSIS — Z8546 Personal history of malignant neoplasm of prostate: Secondary | ICD-10-CM | POA: Insufficient documentation

## 2022-06-18 DIAGNOSIS — R079 Chest pain, unspecified: Secondary | ICD-10-CM | POA: Insufficient documentation

## 2022-06-18 DIAGNOSIS — R058 Other specified cough: Secondary | ICD-10-CM | POA: Insufficient documentation

## 2022-06-18 DIAGNOSIS — J441 Chronic obstructive pulmonary disease with (acute) exacerbation: Secondary | ICD-10-CM

## 2022-06-18 DIAGNOSIS — I2510 Atherosclerotic heart disease of native coronary artery without angina pectoris: Secondary | ICD-10-CM | POA: Diagnosis not present

## 2022-06-18 DIAGNOSIS — J449 Chronic obstructive pulmonary disease, unspecified: Secondary | ICD-10-CM | POA: Diagnosis not present

## 2022-06-18 DIAGNOSIS — I130 Hypertensive heart and chronic kidney disease with heart failure and stage 1 through stage 4 chronic kidney disease, or unspecified chronic kidney disease: Secondary | ICD-10-CM | POA: Diagnosis not present

## 2022-06-18 DIAGNOSIS — N189 Chronic kidney disease, unspecified: Secondary | ICD-10-CM | POA: Insufficient documentation

## 2022-06-18 DIAGNOSIS — R918 Other nonspecific abnormal finding of lung field: Secondary | ICD-10-CM

## 2022-06-18 DIAGNOSIS — I509 Heart failure, unspecified: Secondary | ICD-10-CM | POA: Insufficient documentation

## 2022-06-18 LAB — BASIC METABOLIC PANEL
Anion gap: 7 (ref 5–15)
BUN: 24 mg/dL — ABNORMAL HIGH (ref 8–23)
CO2: 26 mmol/L (ref 22–32)
Calcium: 9.2 mg/dL (ref 8.9–10.3)
Chloride: 103 mmol/L (ref 98–111)
Creatinine, Ser: 0.83 mg/dL (ref 0.61–1.24)
GFR, Estimated: >60 mL/min (ref >60)
Glucose, Bld: 152 mg/dL — ABNORMAL HIGH (ref 70–99)
Potassium: 4.9 mmol/L (ref 3.5–5.1)
Sodium: 136 mmol/L (ref 135–145)

## 2022-06-18 LAB — HEPATIC FUNCTION PANEL
ALT: 14 U/L (ref 0–44)
AST: 21 U/L (ref 15–41)
Albumin: 3.5 g/dL (ref 3.5–5.0)
Alkaline Phosphatase: 134 U/L — ABNORMAL HIGH (ref 38–126)
Bilirubin, Direct: <0.1 mg/dL (ref 0.0–0.2)
Total Bilirubin: 0.7 mg/dL (ref 0.3–1.2)
Total Protein: 6.8 g/dL (ref 6.5–8.1)

## 2022-06-18 LAB — CBC
HCT: 34.7 % — ABNORMAL LOW (ref 39.0–52.0)
Hemoglobin: 11.7 g/dL — ABNORMAL LOW (ref 13.0–17.0)
MCH: 32.7 pg (ref 26.0–34.0)
MCHC: 33.7 g/dL (ref 30.0–36.0)
MCV: 96.9 fL (ref 80.0–100.0)
Platelets: 223 10*3/uL (ref 150–400)
RBC: 3.58 MIL/uL — ABNORMAL LOW (ref 4.22–5.81)
RDW: 14.1 % (ref 11.5–15.5)
WBC: 7.1 10*3/uL (ref 4.0–10.5)
nRBC: 0 % (ref 0.0–0.2)

## 2022-06-18 LAB — RESP PANEL BY RT-PCR (FLU A&B, COVID) ARPGX2
Influenza A by PCR: NEGATIVE
Influenza B by PCR: NEGATIVE
SARS Coronavirus 2 by RT PCR: NEGATIVE

## 2022-06-18 LAB — TROPONIN I (HIGH SENSITIVITY)
Troponin I (High Sensitivity): 10 ng/L (ref <18)
Troponin I (High Sensitivity): 10 ng/L (ref <18)

## 2022-06-18 LAB — LIPASE, BLOOD: Lipase: 39 U/L (ref 11–51)

## 2022-06-18 MED ORDER — ALBUTEROL SULFATE HFA 108 (90 BASE) MCG/ACT IN AERS: 2.0000 | INHALATION_SPRAY | Freq: Four times a day (QID) | RESPIRATORY_TRACT | 2 refills | Status: DC | PRN

## 2022-06-18 MED ORDER — AEROCHAMBER MV MISC: 0 refills | Status: DC

## 2022-06-18 MED ORDER — AZITHROMYCIN 250 MG PO TABS: ORAL_TABLET | ORAL | 0 refills | Status: AC

## 2022-06-18 MED ORDER — IPRATROPIUM-ALBUTEROL 0.5-2.5 (3) MG/3ML IN SOLN
3.0000 mL | Freq: Once | RESPIRATORY_TRACT | Status: AC
  Administered 2022-06-18: 3 mL via RESPIRATORY_TRACT
  Filled 2022-06-18: qty 3

## 2022-06-18 MED ORDER — IOHEXOL 350 MG/ML SOLN
75.0000 mL | Freq: Once | INTRAVENOUS | Status: AC | PRN
  Administered 2022-06-18: 75 mL via INTRAVENOUS

## 2022-06-18 MED ORDER — PREDNISONE 10 MG (21) PO TBPK: ORAL_TABLET | ORAL | 0 refills | Status: DC

## 2022-06-18 NOTE — ED Triage Notes (Signed)
Pt here via [facility] with sudden CP. Pt has a pacemaker. Pt states pain has resolved, just a mild pain when he inhales. No aspirin given by ems.  103/49 then 89/44 99% RA 70

## 2022-06-18 NOTE — ED Provider Notes (Signed)
[facility] Provider Note    Event Date/Time   First MD Initiated Contact with Patient 06/18/22 1315     (approximate)   History   Chest Pain   HPI  Todd Strite. is a 81 y.o. male   Past medical history of CAD, pacemaker, CHF, CKD, COPD, hypertension and prostate cancer presents to the emergency department with pleuritic chest pain for the last several days.  Pain on the lower part of bilateral chest with deep breaths.  Mild cough.  Some shortness of breath.  No leg pain or swelling.  Has been compliant with all medications.  No Fevers or chills.    History was obtained via the patient. Independent historian includes family member who is at bedside to offer collateral information as above.      Physical Exam   Triage Vital Signs: ED Triage Vitals  Enc Vitals Group     BP 06/18/22 1212 (!) 105/59     Pulse Rate 06/18/22 1212 71     Resp 06/18/22 1212 16     Temp 06/18/22 1212 97.8 F (36.6 C)     Temp Source 06/18/22 1212 Axillary     SpO2 06/18/22 1212 99 %     Weight 06/18/22 1207 115 lb 1.3 oz (52.2 kg)     Height 06/18/22 1207 6' (1.829 m)     Head Circumference --      Peak Flow --      Pain Score 06/18/22 1207 10     Pain Loc --      Pain Edu? --      Excl. in [place]? --     Most recent vital signs: Vitals:   06/18/22 1212 06/18/22 1323  BP: (!) 105/59 121/71  Pulse: 71 74  Resp: 16 (!) 21  Temp: 97.8 F (36.6 C)   SpO2: 99% 100%    General: Awake, no distress.  CV:  Good peripheral perfusion.  Resp:  Normal effort.  Scant wheeze on the right side greater than left. Abd:  No distention.  Nontender to palpation in the abdomen. Other:  nontoxic, pleasant   ED Results / Procedures / Treatments   Labs (all labs ordered are listed, but only abnormal results are displayed) Labs Reviewed  BASIC METABOLIC PANEL - Abnormal; Notable for the following components:      Result Value   Glucose, Bld 152 (*)    BUN 24 (*)    All  other components within normal limits  CBC - Abnormal; Notable for the following components:   RBC 3.58 (*)    Hemoglobin 11.7 (*)    HCT 34.7 (*)    All other components within normal limits  RESP PANEL BY RT-PCR (FLU A&B, COVID) ARPGX2  HEPATIC FUNCTION PANEL  LIPASE, BLOOD  TROPONIN I (HIGH SENSITIVITY)  TROPONIN I (HIGH SENSITIVITY)     I reviewed labs and they are notable for initial troponin is 10.  Hemoglobin 11.7 stable from prior.  EKG  ED ECG REPORT I, Lucillie Garfinkel, the attending physician, personally viewed and interpreted this ECG.   Date: 06/18/2022  EKG Time: 1215  Rate: 72  Rhythm: paced rhythm  Intervals: paced rhythm   ST&T Change: no acute ischemia    RADIOLOGY I independently reviewed and interpreted cxr and see no pneumothorax or focality   PROCEDURES:  Critical Care performed: No  Procedures   MEDICATIONS ORDERED IN ED: Medications  ipratropium-albuterol (DUONEB) 0.5-2.5 (3) MG/3ML nebulizer solution 3  mL (has no administration in time range)     IMPRESSION / MDM / ASSESSMENT AND PLAN / ED COURSE  I reviewed the triage vital signs and the nursing notes.                              Differential diagnosis includes, but is not limited to, COPD exacerbation, viral respiratory infection, pleurisy, PE, ACS, pneumonia   The patient is on the cardiac monitor to evaluate for evidence of arrhythmia and/or significant heart rate changes.  MDM: With some scant wheezing on examination we will give a DuoNeb and assess for movement.  He also has pleuritic chest pain and shortness of breath along with a mild cough, does not appear floridly fluid overloaded on my exam nor on chest x-ray which is clear.  CTA to test for pulmonary embolism.  EKG and troponins to assess for ACS. Viral swabs.    Patient's presentation is most consistent with acute presentation with potential threat to life or bodily function.       FINAL CLINICAL IMPRESSION(S) / ED  DIAGNOSES   Final diagnoses:  None     Rx / DC Orders   ED Discharge Orders     None        Note:  This document was prepared using Dragon voice recognition software and may include unintentional dictation errors.    Lucillie Garfinkel, MD 06/18/22 [phone]

## 2022-06-18 NOTE — ED Notes (Signed)
Dr. Bradler at bedside 

## 2022-06-18 NOTE — ED Provider Notes (Signed)
Emergency department handoff note  Care of this patient was signed out to me at the end of the previous provider shift.  All pertinent patient information was conveyed and all questions were answered.  Patient pending CT angiography of the chest showing increasing nodular disease concerning for possible metastasis as well as emphysema and airway inflammation concerning for possible infectious upper airway disease.  These results were conveyed to patient who denies any previous knowledge of metastatic disease.  Patient states that he will be following up with Dr. Bernardo Heater tomorrow  Dispo: Discharge home with urology follow-up tomorrow   [name], MD 06/18/22 2326

## 2022-06-18 NOTE — ED Notes (Signed)
ED Provider at bedside. 

## 2022-06-18 NOTE — ED Notes (Signed)
Pt given cup of water, okayed by Dr Cheri Fowler

## 2022-06-19 ENCOUNTER — Encounter: Payer: Self-pay | Admitting: Urology

## 2022-06-19 ENCOUNTER — Ambulatory Visit (INDEPENDENT_AMBULATORY_CARE_PROVIDER_SITE_OTHER): Payer: Medicare Other | Admitting: Urology

## 2022-06-19 VITALS — BP 120/80 | HR 74 | Ht 72.0 in | Wt 115.0 lb

## 2022-06-19 DIAGNOSIS — R918 Other nonspecific abnormal finding of lung field: Secondary | ICD-10-CM

## 2022-06-19 DIAGNOSIS — N2889 Other specified disorders of kidney and ureter: Secondary | ICD-10-CM

## 2022-06-19 DIAGNOSIS — C61 Malignant neoplasm of prostate: Secondary | ICD-10-CM | POA: Diagnosis not present

## 2022-06-19 DIAGNOSIS — C787 Secondary malignant neoplasm of liver and intrahepatic bile duct: Secondary | ICD-10-CM

## 2022-06-19 DIAGNOSIS — N281 Cyst of kidney, acquired: Secondary | ICD-10-CM
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
## 2022-06-29 ENCOUNTER — Inpatient Hospital Stay: Payer: Medicare Other

## 2022-06-29 ENCOUNTER — Encounter: Payer: Self-pay | Admitting: Oncology

## 2022-06-29 ENCOUNTER — Inpatient Hospital Stay: Payer: Medicare Other | Attending: Oncology | Admitting: Oncology

## 2022-06-29 VITALS — BP 86/50 | HR 76 | Temp 97.2°F | Resp 16 | Wt 115.1 lb

## 2022-06-29 DIAGNOSIS — J439 Emphysema, unspecified: Secondary | ICD-10-CM | POA: Diagnosis not present

## 2022-06-29 DIAGNOSIS — C61 Malignant neoplasm of prostate: Secondary | ICD-10-CM | POA: Diagnosis not present

## 2022-06-29 DIAGNOSIS — R7989 Other specified abnormal findings of blood chemistry: Secondary | ICD-10-CM | POA: Diagnosis not present

## 2022-06-29 DIAGNOSIS — Z8042 Family history of malignant neoplasm of prostate: Secondary | ICD-10-CM

## 2022-06-29 DIAGNOSIS — R634 Abnormal weight loss: Secondary | ICD-10-CM | POA: Insufficient documentation

## 2022-06-29 DIAGNOSIS — Z79899 Other long term (current) drug therapy: Secondary | ICD-10-CM | POA: Diagnosis not present

## 2022-06-29 DIAGNOSIS — K769 Liver disease, unspecified: Secondary | ICD-10-CM | POA: Diagnosis not present

## 2022-06-29 DIAGNOSIS — Z87891 Personal history of nicotine dependence: Secondary | ICD-10-CM | POA: Insufficient documentation

## 2022-06-29 DIAGNOSIS — C801 Malignant (primary) neoplasm, unspecified: Secondary | ICD-10-CM | POA: Diagnosis present

## 2022-06-29 DIAGNOSIS — R918 Other nonspecific abnormal finding of lung field: Secondary | ICD-10-CM | POA: Insufficient documentation

## 2022-06-29 DIAGNOSIS — C787 Secondary malignant neoplasm of liver and intrahepatic bile duct: Secondary | ICD-10-CM

## 2022-06-29 LAB — PROTIME-INR
INR: 1.2 (ref 0.8–1.2)
Prothrombin Time: 14.8 seconds (ref 11.4–15.2)

## 2022-06-29 LAB — PSA: Prostatic Specific Antigen: 0.14 ng/mL (ref 0.00–4.00)

## 2022-06-29 NOTE — Progress Notes (Signed)
  Pt will like to discuss if pt is able to take melatonin or any other sleeping aids; pt does not sleep through the night.

## 2022-06-30 ENCOUNTER — Telehealth: Payer: Self-pay | Admitting: *Deleted

## 2022-06-30 LAB — CEA: CEA: 1340 ng/mL — ABNORMAL HIGH (ref 0.0–4.7)

## 2022-06-30 NOTE — Telephone Encounter (Signed)
Called the house and wife says that they have been waiting for dat. They can do I t12/11 arrive 8am for 9 am at [facility] looking for cardiac and vascular on the building. Nothing to eat or drink 8 hours. They are ok with the above

## 2022-07-05 ENCOUNTER — Encounter: Payer: Self-pay | Admitting: Oncology
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
## 2022-07-09 ENCOUNTER — Other Ambulatory Visit (HOSPITAL_COMMUNITY): Payer: Self-pay | Admitting: Student

## 2022-07-09 DIAGNOSIS — N2889 Other specified disorders of kidney and ureter: Secondary | ICD-10-CM

## 2022-07-10 ENCOUNTER — Telehealth: Payer: Self-pay | Admitting: *Deleted

## 2022-07-10 NOTE — Telephone Encounter (Signed)
Wife called to ask about how to get to heart and vascular area and I talked her through it and she wanted to know if they have [place]. They do have a place to pull under the covered area and there is person to help pt get in wheelchair. Wife is good with this

## 2022-07-10 NOTE — Progress Notes (Signed)
Patient for US guided Liver Lesion Biopsy LT lobe on Mon 07/13/2022, I called and spoke with the patient's wife on phone and gave pre-procedure instructions. Pt's wife was made aware to be here at [place] and she is driving him to the [place] entrance due to needing Valet service (Rob [place]- Outpt Reg Supervisor is aware of this and will bring them to [facility] after the patient is registered), NPO after MN prior to procedure as well as driver post procedure/recovery/discharge. Pt's wife stated understanding.   Called 07/10/2022

## 2022-07-13 ENCOUNTER — Ambulatory Visit
Admission: RE | Admit: 2022-07-13 | Discharge: 2022-07-13 | Disposition: A | Discharge status: Home / Self Care | Payer: Medicare Other | Source: Ambulatory Visit | Attending: Oncology | Admitting: Oncology

## 2022-07-13 ENCOUNTER — Other Ambulatory Visit: Payer: Self-pay

## 2022-07-13 DIAGNOSIS — R160 Hepatomegaly, not elsewhere classified: Secondary | ICD-10-CM | POA: Diagnosis present

## 2022-07-13 DIAGNOSIS — J449 Chronic obstructive pulmonary disease, unspecified: Secondary | ICD-10-CM | POA: Diagnosis not present

## 2022-07-13 DIAGNOSIS — I130 Hypertensive heart and chronic kidney disease with heart failure and stage 1 through stage 4 chronic kidney disease, or unspecified chronic kidney disease: Secondary | ICD-10-CM | POA: Insufficient documentation

## 2022-07-13 DIAGNOSIS — N189 Chronic kidney disease, unspecified: Secondary | ICD-10-CM | POA: Diagnosis not present

## 2022-07-13 DIAGNOSIS — N2889 Other specified disorders of kidney and ureter: Secondary | ICD-10-CM | POA: Insufficient documentation

## 2022-07-13 DIAGNOSIS — C787 Secondary malignant neoplasm of liver and intrahepatic bile duct: Secondary | ICD-10-CM | POA: Diagnosis not present

## 2022-07-13 DIAGNOSIS — K219 Gastro-esophageal reflux disease without esophagitis: Secondary | ICD-10-CM | POA: Insufficient documentation

## 2022-07-13 DIAGNOSIS — I509 Heart failure, unspecified: Secondary | ICD-10-CM | POA: Diagnosis not present

## 2022-07-13 LAB — CBC
HCT: 34.7 % — ABNORMAL LOW (ref 39.0–52.0)
Hemoglobin: 11.6 g/dL — ABNORMAL LOW (ref 13.0–17.0)
MCH: 32.8 pg (ref 26.0–34.0)
MCHC: 33.4 g/dL (ref 30.0–36.0)
MCV: 98 fL (ref 80.0–100.0)
Platelets: 222 10*3/uL (ref 150–400)
RBC: 3.54 MIL/uL — ABNORMAL LOW (ref 4.22–5.81)
RDW: 14 % (ref 11.5–15.5)
WBC: 8 10*3/uL (ref 4.0–10.5)
nRBC: 0 % (ref 0.0–0.2)

## 2022-07-13 LAB — PROTIME-INR
INR: 1.2 (ref 0.8–1.2)
Prothrombin Time: 14.7 seconds (ref 11.4–15.2)

## 2022-07-13 MED ORDER — LIDOCAINE HCL (PF) 1 % IJ SOLN
10.0000 mL | Freq: Once | INTRAMUSCULAR | Status: AC
  Administered 2022-07-13: 10 mL via INTRADERMAL

## 2022-07-13 MED ORDER — FENTANYL CITRATE (PF) 100 MCG/2ML IJ SOLN
INTRAMUSCULAR | Status: AC
  Filled 2022-07-13: qty 2

## 2022-07-13 MED ORDER — FENTANYL CITRATE (PF) 100 MCG/2ML IJ SOLN
INTRAMUSCULAR | Status: AC | PRN
  Administered 2022-07-13 (×2): 25 ug via INTRAVENOUS

## 2022-07-13 MED ORDER — SODIUM CHLORIDE 0.9 % IV SOLN: INTRAVENOUS | Status: DC

## 2022-07-13 MED ORDER — MIDAZOLAM HCL 2 MG/2ML IJ SOLN
INTRAMUSCULAR | Status: AC
  Filled 2022-07-13: qty 2

## 2022-07-13 MED ORDER — MIDAZOLAM HCL 2 MG/2ML IJ SOLN
INTRAMUSCULAR | Status: AC | PRN
  Administered 2022-07-13: 1 mg via INTRAVENOUS
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
## 2022-07-13 NOTE — Procedures (Signed)
Interventional Radiology Procedure Note  Date of Procedure: 07/13/2022  Procedure: US liver biopsy   Findings:  1. [place] core biopsy of left liver lobe mass    Complications: No immediate complications noted.   Estimated Blood Loss: minimal  Follow-up and Recommendations: 1. Bedrest 2 hours    Albin Felling, MD  Vascular & Interventional Radiology  07/13/2022 10:17 AM

## 2022-07-13 NOTE — Progress Notes (Signed)
Patient clinically stable post Liver biopsy per Dr Denna Haggard, tolerated well. Vitals stable pre and post procedure. Received Versed 1 mg along with Fentanlyl 50 mcg IV for procedure. Denies complaints at present time. Report given to Gari Crown RN post Nita Sickle

## 2022-07-15 ENCOUNTER — Other Ambulatory Visit: Payer: Self-pay | Admitting: Pathology

## 2022-07-15 LAB — SURGICAL PATHOLOGY

## 2022-07-17 ENCOUNTER — Inpatient Hospital Stay: Payer: Medicare Other | Attending: Oncology | Admitting: Oncology

## 2022-07-17 ENCOUNTER — Encounter: Payer: Self-pay | Admitting: Oncology

## 2022-07-17 VITALS — BP 83/41 | HR 69 | Temp 98.1°F | Resp 16 | Wt 111.4 lb

## 2022-07-17 DIAGNOSIS — C801 Malignant (primary) neoplasm, unspecified: Secondary | ICD-10-CM | POA: Diagnosis not present

## 2022-07-17 DIAGNOSIS — Z87891 Personal history of nicotine dependence: Secondary | ICD-10-CM | POA: Diagnosis not present

## 2022-07-17 DIAGNOSIS — R970 Elevated carcinoembryonic antigen [CEA]: Secondary | ICD-10-CM | POA: Diagnosis not present

## 2022-07-17 DIAGNOSIS — C799 Secondary malignant neoplasm of unspecified site: Secondary | ICD-10-CM

## 2022-07-17 DIAGNOSIS — Z79899 Other long term (current) drug therapy: Secondary | ICD-10-CM | POA: Insufficient documentation

## 2022-07-17 DIAGNOSIS — C787 Secondary malignant neoplasm of liver and intrahepatic bile duct: Secondary | ICD-10-CM | POA: Insufficient documentation

## 2022-07-17 DIAGNOSIS — Z7189 Other specified counseling: Secondary | ICD-10-CM

## 2022-07-17 DIAGNOSIS — R5383 Other fatigue: Secondary | ICD-10-CM | POA: Diagnosis not present

## 2022-07-17 DIAGNOSIS — Z993 Dependence on wheelchair: Secondary | ICD-10-CM | POA: Insufficient documentation

## 2022-07-17 DIAGNOSIS — C7800 Secondary malignant neoplasm of unspecified lung: Secondary | ICD-10-CM | POA: Diagnosis not present
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
## 2022-07-22 ENCOUNTER — Telehealth: Payer: Self-pay | Admitting: *Deleted

## 2022-07-22 NOTE — Telephone Encounter (Signed)
The wife called back and said to call. I called back and spoke to patient and wife. I asked him if he was eating and drinking and he is but the only thing they can do is microwave because they are in hotel. I asked about has he thought about chemo.; Dr. Janese Banks has said that the specimen of bx was more like  Gi cancer, or pancreatic cancer or possible bladder. She said if he wanted chemo then she would try folfox which is chemo that you get most of if in the infusion suite but then take pouch that you would get 5FU  over 2 days and then come back on 3rd day and get the pump off and he would have to have a portacart put in for this treatment. She would like to get PET scan if ok to see if anything lights up that could possibly help with a possible help to figure out what kind of cancer it may be. He is ok with pet. I will get it set up and let him know.

## 2022-07-22 NOTE — Telephone Encounter (Signed)
I called and left message to Mrs. Williamson and wanted to check on patient. At last time seen, wanted to poss. Get bx, and could have treatment for cancer if he wants or other things. Please call us back to speak about how he is doing  and plan for future. Left my direct phone # [phone]

## 2022-07-23 ENCOUNTER — Inpatient Hospital Stay: Payer: Medicare Other

## 2022-07-24 ENCOUNTER — Other Ambulatory Visit: Payer: Self-pay | Admitting: *Deleted

## 2022-07-24 ENCOUNTER — Other Ambulatory Visit: Payer: Self-pay | Admitting: Student

## 2022-07-24 ENCOUNTER — Telehealth: Payer: Self-pay | Admitting: *Deleted

## 2022-07-24 DIAGNOSIS — C799 Secondary malignant neoplasm of unspecified site: Secondary | ICD-10-CM

## 2022-07-24 NOTE — Telephone Encounter (Signed)
Called the direct cell and got voicemail and I left message that we are waiting on insurance and and getting date for pet. Dr. Janese Banks did suggest that a endoscopy could help to find the primary cancer hopefully. Wanted to know if he wanted to do that. I will keep him updated about pet. Left my direct number

## 2022-07-29 ENCOUNTER — Emergency Department
Admission: EM | Admit: 2022-07-29 | Discharge: 2022-07-30 | Disposition: A | Discharge status: Home / Self Care | Payer: Medicare Other | Attending: Emergency Medicine | Admitting: Emergency Medicine

## 2022-07-29 ENCOUNTER — Emergency Department: Payer: Medicare Other

## 2022-07-29 DIAGNOSIS — R079 Chest pain, unspecified: Secondary | ICD-10-CM | POA: Diagnosis present

## 2022-07-29 DIAGNOSIS — J449 Chronic obstructive pulmonary disease, unspecified: Secondary | ICD-10-CM | POA: Insufficient documentation

## 2022-07-29 DIAGNOSIS — Z79899 Other long term (current) drug therapy: Secondary | ICD-10-CM | POA: Insufficient documentation

## 2022-07-29 DIAGNOSIS — Z1152 Encounter for screening for COVID-19: Secondary | ICD-10-CM | POA: Insufficient documentation

## 2022-07-29 DIAGNOSIS — R0789 Other chest pain: Secondary | ICD-10-CM

## 2022-07-29 DIAGNOSIS — Z950 Presence of cardiac pacemaker: Secondary | ICD-10-CM | POA: Diagnosis not present

## 2022-07-29 DIAGNOSIS — I2510 Atherosclerotic heart disease of native coronary artery without angina pectoris: Secondary | ICD-10-CM | POA: Diagnosis not present

## 2022-07-29 DIAGNOSIS — I130 Hypertensive heart and chronic kidney disease with heart failure and stage 1 through stage 4 chronic kidney disease, or unspecified chronic kidney disease: Secondary | ICD-10-CM | POA: Diagnosis not present

## 2022-07-29 DIAGNOSIS — N189 Chronic kidney disease, unspecified: Secondary | ICD-10-CM | POA: Diagnosis not present

## 2022-07-29 DIAGNOSIS — Z8546 Personal history of malignant neoplasm of prostate: Secondary | ICD-10-CM | POA: Insufficient documentation

## 2022-07-29 DIAGNOSIS — Z20822 Contact with and (suspected) exposure to covid-19: Secondary | ICD-10-CM | POA: Diagnosis not present

## 2022-07-29 DIAGNOSIS — I509 Heart failure, unspecified: Secondary | ICD-10-CM | POA: Diagnosis not present

## 2022-07-29 LAB — CBC
HCT: 33.5 % — ABNORMAL LOW (ref 39.0–52.0)
Hemoglobin: 10.9 g/dL — ABNORMAL LOW (ref 13.0–17.0)
MCH: 32.2 pg (ref 26.0–34.0)
MCHC: 32.5 g/dL (ref 30.0–36.0)
MCV: 99.1 fL (ref 80.0–100.0)
Platelets: 282 10*3/uL (ref 150–400)
RBC: 3.38 MIL/uL — ABNORMAL LOW (ref 4.22–5.81)
RDW: 14.3 % (ref 11.5–15.5)
WBC: 8.3 10*3/uL (ref 4.0–10.5)
nRBC: 0 % (ref 0.0–0.2)

## 2022-07-29 LAB — BASIC METABOLIC PANEL
Anion gap: 8 (ref 5–15)
BUN: 22 mg/dL (ref 8–23)
CO2: 24 mmol/L (ref 22–32)
Calcium: 9.1 mg/dL (ref 8.9–10.3)
Chloride: 103 mmol/L (ref 98–111)
Creatinine, Ser: 0.89 mg/dL (ref 0.61–1.24)
GFR, Estimated: >60 mL/min (ref >60)
Glucose, Bld: 96 mg/dL (ref 70–99)
Potassium: 4.3 mmol/L (ref 3.5–5.1)
Sodium: 135 mmol/L (ref 135–145)

## 2022-07-29 LAB — TROPONIN I (HIGH SENSITIVITY)
Troponin I (High Sensitivity): 10 ng/L (ref <18)
Troponin I (High Sensitivity): 9 ng/L (ref <18)

## 2022-07-29 NOTE — ED Provider Triage Note (Signed)
Emergency Medicine Provider Triage Evaluation Note  Todd GRIFFING Sr., a 81 y.o. male  was evaluated in triage.  Pt complains of weakness and chest pain for several weeks.  With history of COPD, third-degree heart block and CHF, presents to the ED via EMS from home.  No reports of any nausea, vomiting, diarrhea, fevers.  Patient reports intermittent weakness in chest pain over the last few weeks.  Review of Systems  Positive: Muscle weakness, CP Negative: FCS  Physical Exam  BP (!) 144/69 (BP Location: Right Arm)   Pulse 75   Temp (!) 97.5 F (36.4 C) (Oral)   Resp 18   SpO2 100%  Gen:   Awake, no distress   Resp:  Normal effort  MSK:   Moves extremities without difficulty  Other:    Medical Decision Making  Medically screening exam initiated at 10:03 PM.  Appropriate orders placed.  Todd Kocher Sr. was informed that the remainder of the evaluation will be completed by another provider, this initial triage assessment does not replace that evaluation, and the importance of remaining in the ED until their evaluation is complete.  Geriatric patient to the ED for evaluation of generalized weakness and intermittent chest pain for the last few weeks.   Melvenia Needles, PA-C 07/29/22 2205

## 2022-07-29 NOTE — ED Notes (Signed)
Chart reviewed; initial triage notes c/o CP; trop added on to initial labs performed at 1450 and lab notified; 2nd trop will be drawn now

## 2022-07-29 NOTE — ED Triage Notes (Signed)
Pt presents to the ED via  [facility] from home due to increasing weakness and CP x several weeks. Pt denies NVD. Pt A&Ox4\

## 2022-07-29 NOTE — ED Triage Notes (Signed)
First Nurse NOte:  Arrives from home via [facility].  C/O intermittent weakness x several weeks.  VS wnl.  BP  200/96.
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
## 2022-07-30 ENCOUNTER — Telehealth: Payer: Self-pay | Admitting: *Deleted

## 2022-07-30 LAB — RESP PANEL BY RT-PCR (RSV, FLU A&B, COVID)  RVPGX2
Influenza A by PCR: NEGATIVE
Influenza B by PCR: NEGATIVE
Resp Syncytial Virus by PCR: NEGATIVE
SARS Coronavirus 2 by RT PCR: NEGATIVE

## 2022-07-30 LAB — URINALYSIS, ROUTINE W REFLEX MICROSCOPIC
Bacteria, UA: NONE SEEN
Bilirubin Urine: NEGATIVE
Glucose, UA: NEGATIVE mg/dL
Hgb urine dipstick: NEGATIVE
Ketones, ur: 5 mg/dL — AB
Leukocytes,Ua: NEGATIVE
Nitrite: NEGATIVE
Protein, ur: 30 mg/dL — AB
Specific Gravity, Urine: 1.015 (ref 1.005–1.030)
Squamous Epithelial / HPF: NONE SEEN /HPF (ref 0–5)
pH: 6 (ref 5.0–8.0)

## 2022-07-30 NOTE — Telephone Encounter (Signed)
Called the pt and got voicemail and I left message that we got pet scan scheduled and it is off of [address] and there is a drive off of [address] and it says [place]. It is more like the driveway to turn in for the imaging center. Nothing to eat or drink 6 hours prior to the scan. He can drink water. Arrival at 12 for a 12:30 appt. I also left my number to call me back if there is an issue or have questions about location or anything to [phone]

## 2022-07-30 NOTE — Discharge Instructions (Addendum)
Your cardiac workup, chest x-ray, urine, COVID, flu and RSV swabs were all normal/negative today.  I recommend close follow-up with your primary care doctor.  You may take over-the-counter Tylenol as needed for pain.

## 2022-08-12 ENCOUNTER — Ambulatory Visit
Admission: RE | Admit: 2022-08-12 | Discharge: 2022-08-12 | Disposition: A | Discharge status: Home / Self Care | Payer: Medicare Other | Source: Ambulatory Visit | Attending: Oncology | Admitting: Oncology

## 2022-08-12 DIAGNOSIS — R918 Other nonspecific abnormal finding of lung field: Secondary | ICD-10-CM | POA: Diagnosis not present

## 2022-08-12 DIAGNOSIS — C787 Secondary malignant neoplasm of liver and intrahepatic bile duct: Secondary | ICD-10-CM | POA: Insufficient documentation

## 2022-08-12 DIAGNOSIS — C799 Secondary malignant neoplasm of unspecified site: Secondary | ICD-10-CM | POA: Insufficient documentation

## 2022-08-12 DIAGNOSIS — C7951 Secondary malignant neoplasm of bone: Secondary | ICD-10-CM | POA: Insufficient documentation

## 2022-08-12 LAB — GLUCOSE, CAPILLARY: Glucose-Capillary: 90 mg/dL (ref 70–99)

## 2022-08-12 MED ORDER — FLUDEOXYGLUCOSE F - 18 (FDG) INJECTION
5.8000 | Freq: Once | INTRAVENOUS | Status: AC | PRN
  Administered 2022-08-12: 6.18 via INTRAVENOUS

## 2022-08-17 ENCOUNTER — Inpatient Hospital Stay: Payer: Medicare Other | Attending: Oncology | Admitting: Internal Medicine

## 2022-08-17 ENCOUNTER — Inpatient Hospital Stay: Payer: Medicare Other | Admitting: Hospice and Palliative Medicine

## 2022-08-17 ENCOUNTER — Encounter: Payer: Self-pay | Admitting: Internal Medicine

## 2022-08-17 ENCOUNTER — Inpatient Hospital Stay: Payer: Medicare Other | Admitting: Internal Medicine

## 2022-08-17 ENCOUNTER — Telehealth: Payer: Self-pay | Admitting: *Deleted

## 2022-08-17 VITALS — BP 92/57 | HR 57 | Temp 95.8°F | Resp 17 | Wt 111.8 lb

## 2022-08-17 DIAGNOSIS — C7951 Secondary malignant neoplasm of bone: Secondary | ICD-10-CM | POA: Insufficient documentation

## 2022-08-17 DIAGNOSIS — C787 Secondary malignant neoplasm of liver and intrahepatic bile duct: Secondary | ICD-10-CM | POA: Insufficient documentation

## 2022-08-17 DIAGNOSIS — Z993 Dependence on wheelchair: Secondary | ICD-10-CM | POA: Diagnosis not present

## 2022-08-17 DIAGNOSIS — Z79899 Other long term (current) drug therapy: Secondary | ICD-10-CM | POA: Diagnosis not present

## 2022-08-17 DIAGNOSIS — I9589 Other hypotension: Secondary | ICD-10-CM | POA: Insufficient documentation

## 2022-08-17 DIAGNOSIS — C801 Malignant (primary) neoplasm, unspecified: Secondary | ICD-10-CM | POA: Diagnosis present

## 2022-08-17 DIAGNOSIS — C7800 Secondary malignant neoplasm of unspecified lung: Secondary | ICD-10-CM | POA: Insufficient documentation

## 2022-08-17 DIAGNOSIS — Z87891 Personal history of nicotine dependence: Secondary | ICD-10-CM | POA: Insufficient documentation

## 2022-08-17 DIAGNOSIS — R102 Pelvic and perineal pain: Secondary | ICD-10-CM | POA: Diagnosis not present

## 2022-08-17 DIAGNOSIS — C20 Malignant neoplasm of rectum: Secondary | ICD-10-CM

## 2022-08-17 DIAGNOSIS — C799 Secondary malignant neoplasm of unspecified site: Secondary | ICD-10-CM
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
## 2022-08-17 NOTE — Progress Notes (Signed)
Weakness and low energy. Physical Therapy once a week coming into hotel room. Landlords made them move. Now living in a hotel. Appetite is fair. Has dyspnea. Pain is nagging in his groin, prostate. Here for PET results.

## 2022-08-17 NOTE — Telephone Encounter (Signed)
Called the pt's cell phone and his wife answered and I asked if it is ok to get port put in on 1/23 arrival at 12:30. It will be at the [facility] vascular building. It is attached at the front of hospital  . I gave her the instructions of NPO for 8 hours. He has to stop eating by 4:30 am on 1/23. I looked at the meds and all of them he can take when he gets home. Also has to have driver. Because he will get  sedation for the port. They are agreeable and wife will call me back on Monday of next week for me to tell her again about the instructions

## 2022-08-19 ENCOUNTER — Inpatient Hospital Stay (HOSPITAL_BASED_OUTPATIENT_CLINIC_OR_DEPARTMENT_OTHER): Payer: Medicare Other | Admitting: Hospice and Palliative Medicine

## 2022-08-19 ENCOUNTER — Telehealth: Payer: Self-pay | Admitting: *Deleted

## 2022-08-19 DIAGNOSIS — C799 Secondary malignant neoplasm of unspecified site: Secondary | ICD-10-CM | POA: Diagnosis not present

## 2022-08-19 NOTE — Progress Notes (Signed)
Virtual Visit via Telephone Note  I connected with Todd Webster Sr. on 08/19/22 at  2:30 PM EST by telephone and verified that I am speaking with the correct person using two identifiers.  Location: Patient: Home Provider: Clinic   I discussed the limitations, risks, security and privacy concerns of performing an evaluation and management service by telephone and the availability of in person appointments. I also discussed with the patient that there may be a patient responsible charge related to this service. The patient expressed understanding and agreed to proceed.   History of Present Illness: Todd Webster, Sr. is an 82 year old male with multiple medical problems including recently diagnosed metastatic adenocarcinoma of unknown primary: Upper GI versus pancreaticobiliary versus bladder.  Patient had a CT abdomen in November 2023 showing liver lesions concerning for metastatic disease.  This was followed by CTA of the chest on 06/18/2022 which showed multiple bilateral pulmonary nodules.  Patient underwent liver biopsy showing adenocarcinoma of unclear primary.  PET/CT on 08/12/2022 showed hypermetabolic bilateral pulmonary nodules, hepatic masses, and skeletal metastasis as well as hypermetabolism in the rectum concerning for rectal carcinoma.   Observations/Objective: Patient met with Dr. Darrall Dears (covering for Dr. Janese Banks) on 08/17/2022.  Patient's performance status is somewhat poor but patient did voice a desire to pursue chemotherapy versus supportive care/hospice.  Today, spoke with patient and wife.  Patient confirms that he told Dr. Darrall Dears that he wished to pursue workup (flex sig) in addition to chemotherapy.  However, he says he is still wrestling with these decisions and has not fully decided.  He recognizes that treatment would be with palliative intent and he is unsure if it is worth pursuing treatment with a limited life expectancy.  He wants to take the week to continue thinking  about decision-making.  He plans to come to chemo education class scheduled on Monday and then decide on whether to have the port placed on Tuesday.  I agreed to meet with him Monday if he wanted to talk further about goals.  Also recommended referral to community palliative care and patient thought that was a good idea.  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  Will meet with him next week for further discussion.  Follow Up Instructions: Follow-up next week   I discussed the assessment and treatment plan with the patient. The patient was provided an opportunity to ask questions and all were answered. The patient agreed with the plan and demonstrated an understanding of the instructions.   The patient was advised to call back or seek an in-person evaluation if the symptoms worsen or if the condition fails to improve as anticipated.  I provided 15 minutes of non-face-to-face time during this encounter.   Irean Hong, NP

## 2022-08-19 NOTE — Telephone Encounter (Signed)
Spoke to wife earlier in the day and called her back to see if she knew what times are available for Josh NP to check in on him. She said 2:30 so we made a phone call for Josh to call them. Wife is agreeable

## 2022-08-19 NOTE — Telephone Encounter (Signed)
Per brenda in triage- pt is requesting a telephone apt with Josh. Msg sent to scheduling to arrange with patient.

## 2022-08-24 ENCOUNTER — Inpatient Hospital Stay: Payer: Medicare Other | Admitting: Hospice and Palliative Medicine

## 2022-08-24 ENCOUNTER — Other Ambulatory Visit: Payer: Self-pay | Admitting: Internal Medicine

## 2022-08-24 ENCOUNTER — Inpatient Hospital Stay: Payer: Medicare Other

## 2022-08-24 DIAGNOSIS — C20 Malignant neoplasm of rectum: Secondary | ICD-10-CM

## 2022-08-25 ENCOUNTER — Ambulatory Visit
Admission: RE | Admit: 2022-08-25 | Discharge: 2022-08-25 | Disposition: A | Discharge status: Home / Self Care | Payer: Medicare Other | Source: Ambulatory Visit | Attending: Internal Medicine | Admitting: Internal Medicine

## 2022-08-28 ENCOUNTER — Other Ambulatory Visit: Payer: Self-pay

## 2022-08-28 ENCOUNTER — Inpatient Hospital Stay (HOSPITAL_BASED_OUTPATIENT_CLINIC_OR_DEPARTMENT_OTHER): Payer: Medicare Other | Admitting: Hospice and Palliative Medicine

## 2022-08-28 ENCOUNTER — Encounter: Payer: Self-pay | Admitting: Internal Medicine

## 2022-08-28 ENCOUNTER — Observation Stay
Admission: EM | Admit: 2022-08-28 | Discharge: 2022-08-29 | Disposition: A | Discharge status: Home / Self Care | Payer: Medicare Other | Attending: Internal Medicine | Admitting: Internal Medicine

## 2022-08-28 ENCOUNTER — Inpatient Hospital Stay: Payer: Medicare Other

## 2022-08-28 VITALS — BP 46/35 | HR 71 | Temp 94.5°F

## 2022-08-28 DIAGNOSIS — E43 Unspecified severe protein-calorie malnutrition: Secondary | ICD-10-CM | POA: Insufficient documentation

## 2022-08-28 DIAGNOSIS — I130 Hypertensive heart and chronic kidney disease with heart failure and stage 1 through stage 4 chronic kidney disease, or unspecified chronic kidney disease: Secondary | ICD-10-CM | POA: Insufficient documentation

## 2022-08-28 DIAGNOSIS — J449 Chronic obstructive pulmonary disease, unspecified: Secondary | ICD-10-CM | POA: Insufficient documentation

## 2022-08-28 DIAGNOSIS — E860 Dehydration: Principal | ICD-10-CM

## 2022-08-28 DIAGNOSIS — Z87891 Personal history of nicotine dependence: Secondary | ICD-10-CM | POA: Diagnosis not present

## 2022-08-28 DIAGNOSIS — Z1152 Encounter for screening for COVID-19: Secondary | ICD-10-CM | POA: Insufficient documentation

## 2022-08-28 DIAGNOSIS — I959 Hypotension, unspecified: Principal | ICD-10-CM | POA: Insufficient documentation

## 2022-08-28 DIAGNOSIS — R680 Hypothermia, not associated with low environmental temperature: Secondary | ICD-10-CM | POA: Insufficient documentation

## 2022-08-28 DIAGNOSIS — C799 Secondary malignant neoplasm of unspecified site: Secondary | ICD-10-CM

## 2022-08-28 DIAGNOSIS — I9589 Other hypotension: Secondary | ICD-10-CM | POA: Diagnosis not present

## 2022-08-28 DIAGNOSIS — N189 Chronic kidney disease, unspecified: Secondary | ICD-10-CM | POA: Insufficient documentation

## 2022-08-28 DIAGNOSIS — I442 Atrioventricular block, complete: Secondary | ICD-10-CM | POA: Diagnosis present

## 2022-08-28 DIAGNOSIS — C801 Malignant (primary) neoplasm, unspecified: Secondary | ICD-10-CM | POA: Diagnosis not present

## 2022-08-28 DIAGNOSIS — C20 Malignant neoplasm of rectum: Secondary | ICD-10-CM | POA: Diagnosis not present

## 2022-08-28 DIAGNOSIS — I5030 Unspecified diastolic (congestive) heart failure: Secondary | ICD-10-CM | POA: Diagnosis not present

## 2022-08-28 DIAGNOSIS — E861 Hypovolemia: Secondary | ICD-10-CM

## 2022-08-28 DIAGNOSIS — J439 Emphysema, unspecified: Secondary | ICD-10-CM | POA: Diagnosis present

## 2022-08-28 DIAGNOSIS — Z950 Presence of cardiac pacemaker: Secondary | ICD-10-CM | POA: Diagnosis not present

## 2022-08-28 DIAGNOSIS — F32A Depression, unspecified: Secondary | ICD-10-CM | POA: Insufficient documentation

## 2022-08-28 DIAGNOSIS — E785 Hyperlipidemia, unspecified: Secondary | ICD-10-CM | POA: Insufficient documentation

## 2022-08-28 LAB — URINALYSIS, COMPLETE (UACMP) WITH MICROSCOPIC
Bacteria, UA: NONE SEEN
Bilirubin Urine: NEGATIVE
Glucose, UA: NEGATIVE mg/dL
Hgb urine dipstick: NEGATIVE
Ketones, ur: NEGATIVE mg/dL
Leukocytes,Ua: NEGATIVE
Nitrite: NEGATIVE
Protein, ur: 30 mg/dL — AB
Specific Gravity, Urine: 1.025 (ref 1.005–1.030)
Squamous Epithelial / HPF: NONE SEEN /HPF (ref 0–5)
pH: 5 (ref 5.0–8.0)

## 2022-08-28 LAB — COMPREHENSIVE METABOLIC PANEL
ALT: 18 U/L (ref 0–44)
AST: 47 U/L — ABNORMAL HIGH (ref 15–41)
Albumin: 3.1 g/dL — ABNORMAL LOW (ref 3.5–5.0)
Alkaline Phosphatase: 230 U/L — ABNORMAL HIGH (ref 38–126)
Anion gap: 9 (ref 5–15)
BUN: 18 mg/dL (ref 8–23)
CO2: 22 mmol/L (ref 22–32)
Calcium: 8.7 mg/dL — ABNORMAL LOW (ref 8.9–10.3)
Chloride: 103 mmol/L (ref 98–111)
Creatinine, Ser: 0.79 mg/dL (ref 0.61–1.24)
GFR, Estimated: >60 mL/min (ref >60)
Glucose, Bld: 132 mg/dL — ABNORMAL HIGH (ref 70–99)
Potassium: 5.2 mmol/L — ABNORMAL HIGH (ref 3.5–5.1)
Sodium: 134 mmol/L — ABNORMAL LOW (ref 135–145)
Total Bilirubin: 1.4 mg/dL — ABNORMAL HIGH (ref 0.3–1.2)
Total Protein: 6.5 g/dL (ref 6.5–8.1)

## 2022-08-28 LAB — TROPONIN I (HIGH SENSITIVITY)
Troponin I (High Sensitivity): 11 ng/L (ref <18)
Troponin I (High Sensitivity): 9 ng/L (ref <18)

## 2022-08-28 LAB — MAGNESIUM: Magnesium: 1.8 mg/dL (ref 1.7–2.4)

## 2022-08-28 LAB — RESP PANEL BY RT-PCR (RSV, FLU A&B, COVID)  RVPGX2
Influenza A by PCR: NEGATIVE
Influenza B by PCR: NEGATIVE
Resp Syncytial Virus by PCR: NEGATIVE
SARS Coronavirus 2 by RT PCR: NEGATIVE

## 2022-08-28 LAB — CBC WITH DIFFERENTIAL/PLATELET
Abs Immature Granulocytes: 0.03 10*3/uL (ref 0.00–0.07)
Basophils Absolute: 0.1 10*3/uL (ref 0.0–0.1)
Basophils Relative: 1 %
Eosinophils Absolute: 0.3 10*3/uL (ref 0.0–0.5)
Eosinophils Relative: 3 %
HCT: 31.3 % — ABNORMAL LOW (ref 39.0–52.0)
Hemoglobin: 9.7 g/dL — ABNORMAL LOW (ref 13.0–17.0)
Immature Granulocytes: 0 %
Lymphocytes Relative: 23 %
Lymphs Abs: 1.9 10*3/uL (ref 0.7–4.0)
MCH: 32.7 pg (ref 26.0–34.0)
MCHC: 31 g/dL (ref 30.0–36.0)
MCV: 105.4 fL — ABNORMAL HIGH (ref 80.0–100.0)
Monocytes Absolute: 1.2 10*3/uL — ABNORMAL HIGH (ref 0.1–1.0)
Monocytes Relative: 14 %
Neutro Abs: 5.1 10*3/uL (ref 1.7–7.7)
Neutrophils Relative %: 59 %
Platelets: 254 10*3/uL (ref 150–400)
RBC: 2.97 MIL/uL — ABNORMAL LOW (ref 4.22–5.81)
RDW: 13.8 % (ref 11.5–15.5)
WBC: 8.6 10*3/uL (ref 4.0–10.5)
nRBC: 0 % (ref 0.0–0.2)

## 2022-08-28 LAB — LACTIC ACID, PLASMA
Lactic Acid, Venous: 1.2 mmol/L (ref 0.5–1.9)
Lactic Acid, Venous: 2.2 mmol/L (ref 0.5–1.9)

## 2022-08-28 LAB — PROCALCITONIN: Procalcitonin: 0.21 ng/mL

## 2022-08-28 LAB — PHOSPHORUS: Phosphorus: 3.2 mg/dL (ref 2.5–4.6)

## 2022-08-28 MED ORDER — BRIMONIDINE TARTRATE 0.2 % OP SOLN
1.0000 [drp] | Freq: Three times a day (TID) | OPHTHALMIC | Status: DC
  Administered 2022-08-29 (×2): 1 [drp] via OPHTHALMIC
  Filled 2022-08-28: qty 5

## 2022-08-28 MED ORDER — ONDANSETRON HCL 4 MG/2ML IJ SOLN: 4.0000 mg | Freq: Four times a day (QID) | INTRAMUSCULAR | Status: DC | PRN

## 2022-08-28 MED ORDER — PANTOPRAZOLE SODIUM 40 MG PO TBEC
40.0000 mg | DELAYED_RELEASE_TABLET | Freq: Every day | ORAL | Status: DC
  Administered 2022-08-29: 40 mg via ORAL
  Filled 2022-08-28: qty 1

## 2022-08-28 MED ORDER — LACTATED RINGERS IV SOLN: INTRAVENOUS | Status: DC

## 2022-08-28 MED ORDER — BUSPIRONE HCL 10 MG PO TABS
5.0000 mg | ORAL_TABLET | Freq: Two times a day (BID) | ORAL | Status: DC
  Administered 2022-08-28 – 2022-08-29 (×2): 5 mg via ORAL
  Filled 2022-08-28 (×2): qty 1

## 2022-08-28 MED ORDER — SODIUM CHLORIDE 0.9 % IV BOLUS
1000.0000 mL | Freq: Once | INTRAVENOUS | Status: AC
  Administered 2022-08-28: 1000 mL via INTRAVENOUS

## 2022-08-28 MED ORDER — BRINZOLAMIDE 1 % OP SUSP
1.0000 [drp] | Freq: Three times a day (TID) | OPHTHALMIC | Status: DC
  Administered 2022-08-29 (×2): 1 [drp] via OPHTHALMIC
  Filled 2022-08-28: qty 10

## 2022-08-28 MED ORDER — ONDANSETRON HCL 4 MG PO TABS: 4.0000 mg | ORAL_TABLET | Freq: Four times a day (QID) | ORAL | Status: DC | PRN

## 2022-08-28 MED ORDER — SODIUM CHLORIDE 0.9 % IV SOLN
2.0000 g | Freq: Once | INTRAVENOUS | Status: AC
  Administered 2022-08-28: 2 g via INTRAVENOUS
  Filled 2022-08-28: qty 20

## 2022-08-28 MED ORDER — ENOXAPARIN SODIUM 40 MG/0.4ML IJ SOSY
40.0000 mg | PREFILLED_SYRINGE | INTRAMUSCULAR | Status: DC
  Administered 2022-08-28: 40 mg via SUBCUTANEOUS
  Filled 2022-08-28: qty 0.4

## 2022-08-28 MED ORDER — SENNOSIDES-DOCUSATE SODIUM 8.6-50 MG PO TABS: 1.0000 | ORAL_TABLET | Freq: Every evening | ORAL | Status: DC | PRN

## 2022-08-28 MED ORDER — SODIUM CHLORIDE 0.9 % IV SOLN
500.0000 mg | Freq: Once | INTRAVENOUS | Status: AC
  Administered 2022-08-28: 500 mg via INTRAVENOUS
  Filled 2022-08-28: qty 5

## 2022-08-28 MED ORDER — ACETAMINOPHEN 650 MG RE SUPP: 650.0000 mg | Freq: Four times a day (QID) | RECTAL | Status: DC | PRN

## 2022-08-28 MED ORDER — TRAMADOL HCL 50 MG PO TABS
50.0000 mg | ORAL_TABLET | Freq: Four times a day (QID) | ORAL | Status: DC | PRN
  Administered 2022-08-29 (×2): 50 mg via ORAL
  Filled 2022-08-28 (×2): qty 1

## 2022-08-28 MED ORDER — ALBUTEROL SULFATE (2.5 MG/3ML) 0.083% IN NEBU: 2.5000 mg | INHALATION_SOLUTION | Freq: Four times a day (QID) | RESPIRATORY_TRACT | Status: DC | PRN

## 2022-08-28 MED ORDER — ACETAMINOPHEN 325 MG PO TABS: 650.0000 mg | ORAL_TABLET | Freq: Four times a day (QID) | ORAL | Status: DC | PRN

## 2022-08-28 MED ORDER — SODIUM CHLORIDE 0.9 % IV SOLN: INTRAVENOUS | Status: DC

## 2022-08-28 MED ORDER — ATORVASTATIN CALCIUM 20 MG PO TABS
20.0000 mg | ORAL_TABLET | Freq: Every day | ORAL | Status: DC
  Administered 2022-08-29: 20 mg via ORAL
  Filled 2022-08-28: qty 1

## 2022-08-28 MED ORDER — LATANOPROST 0.005 % OP SOLN
1.0000 [drp] | Freq: Every evening | OPHTHALMIC | Status: DC
  Administered 2022-08-29: 1 [drp] via OPHTHALMIC
  Filled 2022-08-28: qty 2.5

## 2022-08-28 NOTE — ED Notes (Signed)
Pt brought over from [place] with RN and MD. Pt BP reading in 40s per staff. Pt out of it. Pt responding to voice and stimuli.  Charge RN called and then [employer] called and pt removed from ED 4 for this pt to come into room   RN Claiborne Billings placed IV before pt taken to room.

## 2022-08-28 NOTE — ED Notes (Signed)
Oral temp 98.2 F, bear hugger turned off

## 2022-08-28 NOTE — Assessment & Plan Note (Signed)
-  Status post pacemaker placement in January 2021

## 2022-08-28 NOTE — Assessment & Plan Note (Signed)
-  Patient not in acute exacerbation at this time - Home albuterol 2 puff inhalation every 6 hours as needed for wheezing and shortness of breath resumed

## 2022-08-28 NOTE — ED Triage Notes (Signed)
Pt to ED accompanied by NP from cancer center. Pt was seen for chemotherapy education and reported he was feeling weak and like he was near-syncope. Staff reports BP 49/35 PTA. Unable to obtain BP in triage. Pt pale and lethargic. RN obtained IV access and taken to room.

## 2022-08-28 NOTE — Assessment & Plan Note (Signed)
-  Atorvastatin 20 mg daily resumed 

## 2022-08-28 NOTE — ED Notes (Signed)
Pt oral temp is 97.4 F, MD notified. Bear hugger applied

## 2022-08-28 NOTE — Assessment & Plan Note (Signed)
-  Registered dietitian has been consulted
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
## 2022-08-28 NOTE — Progress Notes (Signed)
Patient presented to chemo education with c/o dizziness. Pt brought up to med oncology by Basilia Jumbo, RN. Pt very gray and appears unstable. 46/35 BP; HR 71. Temp 94.5. ; oxygen sats reading at 100%. RN unable to get a manual bp on patient. Billey Chang, NP and myself wheeled patient to ER.

## 2022-08-28 NOTE — Hospital Course (Signed)
Mr. 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  Initial vitals in the emergency department showed temperature of 94.5 and improved to 98.2, respiration rate of 15, heart rate of 71, blood pressure 46/35 and improved to 96/70, SpO2 of 100% on room air.  Serum sodium is 134, potassium 5.2, chloride 103, bicarb 22, BUN of 18, serum creatinine of 0.79, EGFR greater than 60, nonfasting blood glucose 132, WBC 8.6, hemoglobin 9.7, platelets of 254.  High sensitive troponin was 11.  COVID/influenza A/influenza B/RSV PCR were negative.  UA was negative for leukocytes and nitrates.  ED treatment: Sodium chloride 1 L bolus, LR IVF at 150 mL/h initiated

## 2022-08-28 NOTE — Assessment & Plan Note (Signed)
-  Buspirone 5 mg p.o. twice daily resumed

## 2022-08-28 NOTE — Assessment & Plan Note (Addendum)
Etiology is likely multifactorial including poor p.o. intake in the setting of rectal adenocarcinoma Protein-calorie malnutrition that is severe Infectious etiology cannot be excluded given possible right lower lobe pneumonia Possible right lower lobe pneumonia Hypothermia - resolved - Check lactic acid, procalcitonin - Azithromycin 500 mg IV and ceftriaxone 2 g IV initiated, one-time doses ordered - Admit to telemetry medical, inpatient

## 2022-08-28 NOTE — ED Provider Notes (Signed)
[facility] Provider Note    Event Date/Time   First MD Initiated Contact with Patient 08/28/22 [phone]     (approximate)  History   Chief Complaint: Hypotension  HPI  Todd Webster. is a 82 y.o. male with a past medical history of CHF, COPD, gastric reflux, hypertension, rectal cancer presents to the emergency department for low blood pressure.  According to the wife they went to the oncologist today to discuss the patient's recent diagnosis of rectal cancer (no treatment has yet been started).  Patient was found to be very hypotensive with a blood pressure of 40 or 50 systolic and patient was sent immediately to the emergency department.  Here the patient does appear weak and fatigued also pale in appearance and quite dehydrated dry mucous membranes.  Wife states for the past week or so the patient has been extremely fatigued has not been eating or drinking much of anything, patient does not believe he drank anything today yesterday or possibly the day before.  Patient is frail and cachectic in appearance.  Patient denies any chest pain or abdominal pain.  Denies any vomiting or diarrhea.  No dysuria although the wife states sometimes he has trouble emptying his bladder.  Blood pressure currently 67 systolic.  Physical Exam   General: Patient is awake he is cachectic in appearance he appears frail weak.  Dry mucous membranes. CV:  Good peripheral perfusion.  Regular rate and rhythm  Resp:  Normal effort.  Equal breath sounds bilaterally.  No obvious wheeze rales or rhonchi. Abd:  No distention.  Soft, nontender.  No rebound or guarding.  Very thin but benign abdomen.  ED Results / Procedures / Treatments   EKG  Self shows what appears to beEKG viewed and interpreted by junction normal rhythm at 70 bpm with a widened QRS, normal axis, normal intervals, nonspecific ST changes.  RADIOLOGY  Chest x-ray viewed and interpreted by myself shows COPD with large lung  volumes but no obvious consolidation.  Radiology is read bilateral basilar consolidation versus atelectasis.   MEDICATIONS ORDERED IN ED: Medications  sodium chloride 0.9 % bolus 1,000 mL (has no administration in time range)     IMPRESSION / MDM / ASSESSMENT AND PLAN / ED COURSE  I reviewed the triage vital signs and the nursing notes.  Patient's presentation is most consistent with acute presentation with potential threat to life or bodily function.  Patient presents emergency department for hypotension.  Patient appears quite frail and cachectic.  Very dry appearing mucous membranes.  Blood pressure currently 67 systolic prior to intervention.  Will begin IV fluids as the patient appears quite dehydrated.  We will check labs including cardiac enzymes we will continue to closely monitor.  Patient keeps his eyes closed most of the exam.  Wife answers most questions although the patient does answer questions appropriately when asked. Patient's workup in the emergency department is overall nonrevealing, urinalysis shows no concerning findings, COVID/flu/RSV is negative, CBC shows no significant findings, chemistry shows no significant findings besides slight hyperkalemia patient receiving fluids.  Chest x-ray does not appear to show any obvious infection.  However given the patient's significant hypotension upon arrival although it did improve with fluids likely indicating more dehydration we will admit to the hospital service for ongoing workup and treatment.  Patient agreeable to plan of care.  FINAL CLINICAL IMPRESSION(S) / ED DIAGNOSES   Hypotension Weakness   Note:  This document was prepared using Dragon voice  recognition software and may include unintentional dictation errors.   Harvest Dark, MD 08/28/22 1601

## 2022-08-28 NOTE — ED Notes (Signed)
Request made for transport to the floor ?
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
## 2022-08-29 DIAGNOSIS — C20 Malignant neoplasm of rectum: Secondary | ICD-10-CM

## 2022-08-29 DIAGNOSIS — E861 Hypovolemia: Secondary | ICD-10-CM | POA: Diagnosis not present

## 2022-08-29 DIAGNOSIS — I959 Hypotension, unspecified: Secondary | ICD-10-CM | POA: Diagnosis not present

## 2022-08-29 DIAGNOSIS — E43 Unspecified severe protein-calorie malnutrition: Secondary | ICD-10-CM

## 2022-08-29 DIAGNOSIS — I9589 Other hypotension: Secondary | ICD-10-CM | POA: Diagnosis not present

## 2022-08-29 LAB — CBC
HCT: 31.7 % — ABNORMAL LOW (ref 39.0–52.0)
Hemoglobin: 10.6 g/dL — ABNORMAL LOW (ref 13.0–17.0)
MCH: 32.8 pg (ref 26.0–34.0)
MCHC: 33.4 g/dL (ref 30.0–36.0)
MCV: 98.1 fL (ref 80.0–100.0)
Platelets: 312 10*3/uL (ref 150–400)
RBC: 3.23 MIL/uL — ABNORMAL LOW (ref 4.22–5.81)
RDW: 13.8 % (ref 11.5–15.5)
WBC: 9.4 10*3/uL (ref 4.0–10.5)
nRBC: 0 % (ref 0.0–0.2)

## 2022-08-29 LAB — BASIC METABOLIC PANEL
Anion gap: 8 (ref 5–15)
BUN: 14 mg/dL (ref 8–23)
CO2: 22 mmol/L (ref 22–32)
Calcium: 8.2 mg/dL — ABNORMAL LOW (ref 8.9–10.3)
Chloride: 109 mmol/L (ref 98–111)
Creatinine, Ser: 0.62 mg/dL (ref 0.61–1.24)
GFR, Estimated: >60 mL/min (ref >60)
Glucose, Bld: 95 mg/dL (ref 70–99)
Potassium: 3.8 mmol/L (ref 3.5–5.1)
Sodium: 139 mmol/L (ref 135–145)

## 2022-08-29 LAB — CORTISOL-AM, BLOOD: Cortisol - AM: 13.7 ug/dL (ref 6.7–22.6)

## 2022-08-29 MED ORDER — POLYETHYLENE GLYCOL 3350 17 G PO PACK
17.0000 g | PACK | Freq: Every day | ORAL | Status: DC
  Administered 2022-08-29: 17 g via ORAL
  Filled 2022-08-29: qty 1

## 2022-08-29 NOTE — Care Management CC44 (Signed)
Condition Code 44 Documentation Completed  Patient Details  Name: Todd Webster Sr. MRN: [id number] Date of Birth: 01-Apr-1941   Condition Code 44 given:  yes  Patient signature on Condition Code 44 notice:  yes  Documentation of 2 MD's agreement:  yes  Code 44 added to claim:  yes     Harriet Masson, RN 08/29/2022, 12:56 PM
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
## 2022-08-30 ENCOUNTER — Other Ambulatory Visit: Payer: Self-pay

## 2022-08-30 ENCOUNTER — Emergency Department
Admission: EM | Admit: 2022-08-30 | Discharge: 2022-08-30 | Disposition: A | Discharge status: Home / Self Care | Payer: Medicare Other | Attending: Emergency Medicine | Admitting: Emergency Medicine

## 2022-08-30 DIAGNOSIS — I110 Hypertensive heart disease with heart failure: Secondary | ICD-10-CM | POA: Insufficient documentation

## 2022-08-30 DIAGNOSIS — E860 Dehydration: Secondary | ICD-10-CM | POA: Insufficient documentation

## 2022-08-30 DIAGNOSIS — Z8546 Personal history of malignant neoplasm of prostate: Secondary | ICD-10-CM | POA: Diagnosis not present

## 2022-08-30 DIAGNOSIS — K6289 Other specified diseases of anus and rectum: Secondary | ICD-10-CM | POA: Diagnosis present

## 2022-08-30 DIAGNOSIS — J449 Chronic obstructive pulmonary disease, unspecified: Secondary | ICD-10-CM | POA: Insufficient documentation

## 2022-08-30 DIAGNOSIS — I5030 Unspecified diastolic (congestive) heart failure: Secondary | ICD-10-CM | POA: Insufficient documentation

## 2022-08-30 DIAGNOSIS — C801 Malignant (primary) neoplasm, unspecified: Secondary | ICD-10-CM | POA: Diagnosis not present

## 2022-08-30 LAB — BASIC METABOLIC PANEL
Anion gap: 8 (ref 5–15)
BUN: 9 mg/dL (ref 8–23)
CO2: 22 mmol/L (ref 22–32)
Calcium: 8.3 mg/dL — ABNORMAL LOW (ref 8.9–10.3)
Chloride: 102 mmol/L (ref 98–111)
Creatinine, Ser: 0.71 mg/dL (ref 0.61–1.24)
GFR, Estimated: >60 mL/min (ref >60)
Glucose, Bld: 114 mg/dL — ABNORMAL HIGH (ref 70–99)
Potassium: 4 mmol/L (ref 3.5–5.1)
Sodium: 132 mmol/L — ABNORMAL LOW (ref 135–145)

## 2022-08-30 LAB — CBC
HCT: 31 % — ABNORMAL LOW (ref 39.0–52.0)
Hemoglobin: 10.2 g/dL — ABNORMAL LOW (ref 13.0–17.0)
MCH: 32.8 pg (ref 26.0–34.0)
MCHC: 32.9 g/dL (ref 30.0–36.0)
MCV: 99.7 fL (ref 80.0–100.0)
Platelets: 277 10*3/uL (ref 150–400)
RBC: 3.11 MIL/uL — ABNORMAL LOW (ref 4.22–5.81)
RDW: 13.8 % (ref 11.5–15.5)
WBC: 7.8 10*3/uL (ref 4.0–10.5)
nRBC: 0 % (ref 0.0–0.2)

## 2022-08-30 LAB — TROPONIN I (HIGH SENSITIVITY): Troponin I (High Sensitivity): 14 ng/L (ref <18)

## 2022-08-30 MED ORDER — SODIUM CHLORIDE 0.9 % IV BOLUS
1000.0000 mL | Freq: Once | INTRAVENOUS | Status: AC
  Administered 2022-08-30: 1000 mL via INTRAVENOUS

## 2022-08-30 MED ORDER — SODIUM CHLORIDE 0.9 % IV BOLUS: 500.0000 mL | Freq: Once | INTRAVENOUS | Status: DC

## 2022-08-30 NOTE — ED Notes (Signed)
Bladder scan 129m

## 2022-08-30 NOTE — ED Notes (Signed)
Pt was given discharge papers and ambulated from stretcher to wheelchair with assist x2.  Wheeled out to [employer] car, in NAD.

## 2022-08-30 NOTE — ED Provider Notes (Signed)
[facility] Provider Note    Event Date/Time   First MD Initiated Contact with Patient 08/30/22 1459     (approximate)   History   Chief Complaint: Rectal Pain   HPI  Todd Melody. is a 82 y.o. male with a history of COPD, prostate cancer, diastolic heart failure, hypertension who reports recently being diagnosed with rectal cancer by Dr. Janese Banks, plan to start chemotherapy soon.  He has had chronic rectal pain and lower abdominal pain for the past year or more, and today the rectal pain seem more intense so he came to the ED for evaluation.  Currently he denies any significant symptoms.  No chest pain or shortness of breath.  No dizziness or falls or trauma.  No constipation or diarrhea or vomiting.  No fever.  Reports normal appetite, poor fluid intake.  Reviewed prior records including discharge summary from the hospital from yesterday which notes the patient was admitted with hypotension, found to be dehydrated.  Blood pressure normalized after IV fluids.  Workup was overall reassuring.  A.m. cortisol level was obtained which was normal.  Patient had gastroenterology evaluation April 30, 2020 with Dr. 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     Physical Exam   Triage Vital Signs: ED Triage Vitals  Enc Vitals Group     BP 08/30/22 1417 (!) 77/51     Pulse Rate 08/30/22 1417 72     Resp 08/30/22 1417 20     Temp 08/30/22 1417 97.7 F (36.5 C)     Temp Source 08/30/22 1417 Axillary     SpO2 08/30/22 1417 94 %     Weight 08/30/22 1419 99 lb 3.3 oz (45 kg)     Height 08/30/22 1419 6' (1.829 m)     Head Circumference --      Peak Flow --      Pain Score 08/30/22 1418 4     Pain Loc --      Pain Edu? --      Excl. in [place]? --     Most recent vital signs: Vitals:   08/30/22 1530 08/30/22 1600  BP: 118/68 (!) 122/58  Pulse: 69 72  Resp: 13 17  Temp:     SpO2: 100% 99%    General: Awake, no distress.  Cachectic CV:  Good peripheral perfusion.  Regular rate and rhythm.  Normal distal pulses Resp:  Normal effort.  Clear to auscultation bilaterally Abd:  No distention.  Soft nontender Other:  No lower extremity edema.  Dry mucous membranes.   ED Results / Procedures / Treatments   Labs (all labs ordered are listed, but only abnormal results are displayed) Labs Reviewed  BASIC METABOLIC PANEL - Abnormal; Notable for the following components:      Result Value   Sodium 132 (*)    Glucose, Bld 114 (*)    Calcium 8.3 (*)    All other components within normal limits  CBC - Abnormal; Notable for the following components:   RBC 3.11 (*)    Hemoglobin 10.2 (*)    HCT 31.0 (*)    All other components within normal limits  URINALYSIS, ROUTINE W REFLEX MICROSCOPIC  TROPONIN I (HIGH SENSITIVITY)  TROPONIN I (HIGH SENSITIVITY)     EKG Interpreted by me Atrial sensed ventricular paced rhythm rate of 72.  Right axis, normal intervals.  Poor R  wave progression.  Normal ST segments and T waves.  No ischemic changes.   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  DDx: Dehydration, AKI, anemia, electrolyte abnormality  Patient's presentation is most consistent with acute presentation with potential threat to life or bodily function.  Patient presents with hypotension, appears very dehydrated.  His other vital signs are normal, he has no other acute focal symptoms.  Abdomen is benign.  Doubt bowel obstruction or perforation or intra-abdominal abscess.  Will give IV  fluids and check labs.   ----------------------------------------- 5:03 PM on 08/30/2022 ----------------------------------------- Labs all reassuring.  Blood pressure normalized after 1 L of saline.  Bladder scan within normal limits.  No acute symptoms to suggest infection, and exam is benign.  Stable for discharge to continue following up with his doctor and oncology.      FINAL CLINICAL IMPRESSION(S) / ED DIAGNOSES   Final diagnoses:  Dehydration     Rx / DC Orders   ED Discharge Orders     None        Note:  This document was prepared using Dragon voice recognition software and may include unintentional dictation errors.   Carrie Mew, MD 08/30/22 [phone]

## 2022-08-30 NOTE — ED Triage Notes (Signed)
Pt to ED from [facility] where he lives. [facility]. Pt has rectal cancer and called EMS for rectal pain ongoing since 1.5 years, also lower abdominal pain since same time period. Pt also complains of a "crook in his neck". Pt refusing to sit in wheelchair, stating he will fall and lay on floor. Moved to recliner. Pt appears emaciated and pale. EMS states this is his normal appearance and that his BP is always low.  EMS VS: HR in 70s, paced rhythm 97% RA 98.2 87/42  Pt is alert and oriented. BP is 77/51.

## 2022-08-30 NOTE — Discharge Instructions (Signed)
Your lab tests were okay today. You should increase your water intake to stay adequately hydrated.  If needed, you can buy a rehydration drink such as Pedialyte or Gatorade (NOT the zero-calorie kind) to help with fluid intake.  You can also make a good hydration drink at home by combining 1 quart of water with 1/2 teaspoon of salt and 2 tablespoons of sugar.

## 2022-08-30 NOTE — ED Notes (Signed)
Wife states pt is able to ambulate at residence with walker.

## 2022-08-31 ENCOUNTER — Telehealth: Payer: Self-pay | Admitting: *Deleted

## 2022-08-31 ENCOUNTER — Telehealth: Payer: Self-pay | Admitting: Oncology

## 2022-08-31 DIAGNOSIS — C20 Malignant neoplasm of rectum: Secondary | ICD-10-CM

## 2022-08-31 DIAGNOSIS — C799 Secondary malignant neoplasm of unspecified site: Secondary | ICD-10-CM

## 2022-08-31 NOTE — Telephone Encounter (Signed)
Left VM with patient to make him aware of appointment scheduled on 1/31 to see Josh Borders and Dr. Janese Banks. Requested he call back to confirm.

## 2022-08-31 NOTE — Telephone Encounter (Signed)
Patient d/c from [facility] this weekend and also required a subsequent ER visit for hypotension/dehydration. Spoke with Dr. Janese Banks and she recommends follow-up with Josh and Dr. Janese Banks this week. (No labs per Dr. Janese Banks).  Anderson Malta will schedule and contact patient.  Note: per ER documentation - patient lives in [facility]. I will enter a Education officer, museum consult for community resources.

## 2022-09-01 ENCOUNTER — Telehealth: Payer: Self-pay | Admitting: Oncology

## 2022-09-01 NOTE — Telephone Encounter (Signed)
Vm left with patient to confirm appointments made to see Dr. Janese Banks and Josh Borders 09/02/22. Requested he call back to confirm.Todd Webster

## 2022-09-02 ENCOUNTER — Inpatient Hospital Stay: Payer: Medicare Other | Admitting: Hospice and Palliative Medicine

## 2022-09-02 ENCOUNTER — Inpatient Hospital Stay: Payer: Medicare Other | Admitting: Oncology

## 2022-09-02 ENCOUNTER — Telehealth: Payer: Self-pay | Admitting: Oncology

## 2022-09-02 ENCOUNTER — Encounter: Payer: Self-pay | Admitting: Licensed Clinical Social Worker

## 2022-09-02 NOTE — Telephone Encounter (Signed)
Wife of Patient called and left VM asking to cancel appointments for today. She stated that she would call back to reschedule when she was availble to do so.

## 2022-09-02 NOTE — Progress Notes (Signed)
[facility] Clinical Social Work  Clinical Social Work was referred by medical provider for assessment of psychosocial needs.  Clinical Social Worker attempted to contact patient by phone  to offer support and assess for needs.  CSW left voicemail with contact information and request for a return call.   FA  Meyer Dockery, LCSW  Clinical Social Worker [facility]          

## 2022-09-03 ENCOUNTER — Telehealth: Payer: Self-pay

## 2022-09-03 NOTE — Telephone Encounter (Signed)
        Patient  visited [facility] on 08/30/2022  for dehydration.   Telephone encounter attempt :  1st  A HIPAA compliant voice message was left requesting a return call.  Instructed patient to call back at [phone].   [place] Resource Care Guide   [email or web address]  ?? [phone]   Website: [email or web address]  [email or web address]

## 2022-09-06 ENCOUNTER — Inpatient Hospital Stay
Admission: EM | Admit: 2022-09-06 | Discharge: 2022-09-09 | DRG: 871 | Disposition: A | Discharge status: Hospice | Payer: Medicare Other | Attending: Internal Medicine | Admitting: Internal Medicine

## 2022-09-06 ENCOUNTER — Other Ambulatory Visit: Payer: Self-pay

## 2022-09-06 ENCOUNTER — Emergency Department: Payer: Medicare Other

## 2022-09-06 DIAGNOSIS — Z888 Allergy status to other drugs, medicaments and biological substances status: Secondary | ICD-10-CM

## 2022-09-06 DIAGNOSIS — Z8042 Family history of malignant neoplasm of prostate: Secondary | ICD-10-CM

## 2022-09-06 DIAGNOSIS — J449 Chronic obstructive pulmonary disease, unspecified: Secondary | ICD-10-CM | POA: Diagnosis present

## 2022-09-06 DIAGNOSIS — R54 Age-related physical debility: Secondary | ICD-10-CM | POA: Diagnosis present

## 2022-09-06 DIAGNOSIS — Z8679 Personal history of other diseases of the circulatory system: Secondary | ICD-10-CM

## 2022-09-06 DIAGNOSIS — K219 Gastro-esophageal reflux disease without esophagitis: Secondary | ICD-10-CM | POA: Diagnosis present

## 2022-09-06 DIAGNOSIS — I5033 Acute on chronic diastolic (congestive) heart failure: Secondary | ICD-10-CM | POA: Diagnosis present

## 2022-09-06 DIAGNOSIS — I2510 Atherosclerotic heart disease of native coronary artery without angina pectoris: Secondary | ICD-10-CM | POA: Diagnosis present

## 2022-09-06 DIAGNOSIS — Z5901 Sheltered homelessness: Secondary | ICD-10-CM

## 2022-09-06 DIAGNOSIS — F17200 Nicotine dependence, unspecified, uncomplicated: Secondary | ICD-10-CM | POA: Diagnosis present

## 2022-09-06 DIAGNOSIS — Z1152 Encounter for screening for COVID-19: Secondary | ICD-10-CM

## 2022-09-06 DIAGNOSIS — Z950 Presence of cardiac pacemaker: Secondary | ICD-10-CM | POA: Insufficient documentation

## 2022-09-06 DIAGNOSIS — C20 Malignant neoplasm of rectum: Secondary | ICD-10-CM

## 2022-09-06 DIAGNOSIS — R748 Abnormal levels of other serum enzymes: Secondary | ICD-10-CM | POA: Diagnosis present

## 2022-09-06 DIAGNOSIS — Z8249 Family history of ischemic heart disease and other diseases of the circulatory system: Secondary | ICD-10-CM

## 2022-09-06 DIAGNOSIS — K5900 Constipation, unspecified: Secondary | ICD-10-CM | POA: Diagnosis present

## 2022-09-06 DIAGNOSIS — J90 Pleural effusion, not elsewhere classified: Secondary | ICD-10-CM

## 2022-09-06 DIAGNOSIS — Z7189 Other specified counseling: Secondary | ICD-10-CM

## 2022-09-06 DIAGNOSIS — R571 Hypovolemic shock: Secondary | ICD-10-CM | POA: Diagnosis present

## 2022-09-06 DIAGNOSIS — Z79899 Other long term (current) drug therapy: Secondary | ICD-10-CM

## 2022-09-06 DIAGNOSIS — Z87898 Personal history of other specified conditions: Secondary | ICD-10-CM

## 2022-09-06 DIAGNOSIS — Z66 Do not resuscitate: Secondary | ICD-10-CM | POA: Diagnosis present

## 2022-09-06 DIAGNOSIS — J439 Emphysema, unspecified: Secondary | ICD-10-CM | POA: Diagnosis present

## 2022-09-06 DIAGNOSIS — I442 Atrioventricular block, complete: Secondary | ICD-10-CM | POA: Diagnosis present

## 2022-09-06 DIAGNOSIS — N189 Chronic kidney disease, unspecified: Secondary | ICD-10-CM | POA: Diagnosis present

## 2022-09-06 DIAGNOSIS — Z515 Encounter for palliative care: Secondary | ICD-10-CM | POA: Diagnosis not present

## 2022-09-06 DIAGNOSIS — I130 Hypertensive heart and chronic kidney disease with heart failure and stage 1 through stage 4 chronic kidney disease, or unspecified chronic kidney disease: Secondary | ICD-10-CM | POA: Diagnosis present

## 2022-09-06 DIAGNOSIS — C787 Secondary malignant neoplasm of liver and intrahepatic bile duct: Secondary | ICD-10-CM | POA: Diagnosis present

## 2022-09-06 DIAGNOSIS — R64 Cachexia: Secondary | ICD-10-CM | POA: Diagnosis present

## 2022-09-06 DIAGNOSIS — Z8546 Personal history of malignant neoplasm of prostate: Secondary | ICD-10-CM

## 2022-09-06 DIAGNOSIS — R579 Shock, unspecified: Secondary | ICD-10-CM

## 2022-09-06 DIAGNOSIS — I5030 Unspecified diastolic (congestive) heart failure: Secondary | ICD-10-CM | POA: Insufficient documentation

## 2022-09-06 DIAGNOSIS — I252 Old myocardial infarction: Secondary | ICD-10-CM

## 2022-09-06 DIAGNOSIS — I9589 Other hypotension: Secondary | ICD-10-CM

## 2022-09-06 DIAGNOSIS — C7951 Secondary malignant neoplasm of bone: Secondary | ICD-10-CM | POA: Diagnosis present

## 2022-09-06 DIAGNOSIS — E871 Hypo-osmolality and hyponatremia: Secondary | ICD-10-CM | POA: Diagnosis present

## 2022-09-06 DIAGNOSIS — E43 Unspecified severe protein-calorie malnutrition: Secondary | ICD-10-CM | POA: Diagnosis present

## 2022-09-06 DIAGNOSIS — I959 Hypotension, unspecified: Principal | ICD-10-CM | POA: Diagnosis present

## 2022-09-06 DIAGNOSIS — E861 Hypovolemia: Secondary | ICD-10-CM | POA: Diagnosis not present

## 2022-09-06 DIAGNOSIS — Z681 Body mass index (BMI) 19 or less, adult: Secondary | ICD-10-CM

## 2022-09-06 DIAGNOSIS — Z87442 Personal history of urinary calculi: Secondary | ICD-10-CM

## 2022-09-06 DIAGNOSIS — Z85048 Personal history of other malignant neoplasm of rectum, rectosigmoid junction, and anus: Secondary | ICD-10-CM

## 2022-09-06 LAB — CBC
HCT: 29.2 % — ABNORMAL LOW (ref 39.0–52.0)
Hemoglobin: 9.6 g/dL — ABNORMAL LOW (ref 13.0–17.0)
MCH: 32.4 pg (ref 26.0–34.0)
MCHC: 32.9 g/dL (ref 30.0–36.0)
MCV: 98.6 fL (ref 80.0–100.0)
Platelets: 331 10*3/uL (ref 150–400)
RBC: 2.96 MIL/uL — ABNORMAL LOW (ref 4.22–5.81)
RDW: 14.1 % (ref 11.5–15.5)
WBC: 9.7 10*3/uL (ref 4.0–10.5)
nRBC: 0 % (ref 0.0–0.2)

## 2022-09-06 LAB — COMPREHENSIVE METABOLIC PANEL
ALT: 16 U/L (ref 0–44)
AST: 33 U/L (ref 15–41)
Albumin: 2.6 g/dL — ABNORMAL LOW (ref 3.5–5.0)
Alkaline Phosphatase: 257 U/L — ABNORMAL HIGH (ref 38–126)
Anion gap: 7 (ref 5–15)
BUN: 12 mg/dL (ref 8–23)
CO2: 22 mmol/L (ref 22–32)
Calcium: 7.8 mg/dL — ABNORMAL LOW (ref 8.9–10.3)
Chloride: 105 mmol/L (ref 98–111)
Creatinine, Ser: 0.57 mg/dL — ABNORMAL LOW (ref 0.61–1.24)
GFR, Estimated: >60 mL/min (ref >60)
Glucose, Bld: 109 mg/dL — ABNORMAL HIGH (ref 70–99)
Potassium: 3.5 mmol/L (ref 3.5–5.1)
Sodium: 134 mmol/L — ABNORMAL LOW (ref 135–145)
Total Bilirubin: 0.8 mg/dL (ref 0.3–1.2)
Total Protein: 5.5 g/dL — ABNORMAL LOW (ref 6.5–8.1)

## 2022-09-06 LAB — BRAIN NATRIURETIC PEPTIDE: B Natriuretic Peptide: 517.6 pg/mL — ABNORMAL HIGH (ref 0.0–100.0)

## 2022-09-06 LAB — URINALYSIS, ROUTINE W REFLEX MICROSCOPIC
Bilirubin Urine: NEGATIVE
Glucose, UA: NEGATIVE mg/dL
Hgb urine dipstick: NEGATIVE
Ketones, ur: NEGATIVE mg/dL
Leukocytes,Ua: NEGATIVE
Nitrite: NEGATIVE
Protein, ur: NEGATIVE mg/dL
Specific Gravity, Urine: 1.038 — ABNORMAL HIGH (ref 1.005–1.030)
pH: 7 (ref 5.0–8.0)

## 2022-09-06 LAB — LIPASE, BLOOD: Lipase: 27 U/L (ref 11–51)

## 2022-09-06 LAB — TROPONIN I (HIGH SENSITIVITY): Troponin I (High Sensitivity): 14 ng/L (ref <18)

## 2022-09-06 LAB — PROCALCITONIN: Procalcitonin: 0.21 ng/mL

## 2022-09-06 LAB — MRSA NEXT GEN BY PCR, NASAL: MRSA by PCR Next Gen: NOT DETECTED

## 2022-09-06 LAB — RESP PANEL BY RT-PCR (RSV, FLU A&B, COVID)  RVPGX2
Influenza A by PCR: NEGATIVE
Influenza B by PCR: NEGATIVE
Resp Syncytial Virus by PCR: NEGATIVE
SARS Coronavirus 2 by RT PCR: NEGATIVE

## 2022-09-06 LAB — GLUCOSE, CAPILLARY: Glucose-Capillary: 98 mg/dL (ref 70–99)

## 2022-09-06 LAB — PROTIME-INR
INR: 1.4 — ABNORMAL HIGH (ref 0.8–1.2)
Prothrombin Time: 17.1 seconds — ABNORMAL HIGH (ref 11.4–15.2)

## 2022-09-06 LAB — LACTIC ACID, PLASMA
Lactic Acid, Venous: 1.8 mmol/L (ref 0.5–1.9)
Lactic Acid, Venous: 1.9 mmol/L (ref 0.5–1.9)

## 2022-09-06 MED ORDER — SODIUM CHLORIDE 0.9 % IV SOLN: 250.0000 mL | INTRAVENOUS | Status: DC

## 2022-09-06 MED ORDER — NOREPINEPHRINE 4 MG/250ML-% IV SOLN
INTRAVENOUS | Status: AC
  Filled 2022-09-06: qty 250

## 2022-09-06 MED ORDER — IOHEXOL 350 MG/ML SOLN
100.0000 mL | Freq: Once | INTRAVENOUS | Status: AC | PRN
  Administered 2022-09-06: 100 mL via INTRAVENOUS

## 2022-09-06 MED ORDER — POTASSIUM CHLORIDE CRYS ER 20 MEQ PO TBCR
20.0000 meq | EXTENDED_RELEASE_TABLET | Freq: Once | ORAL | Status: AC
  Administered 2022-09-06: 20 meq via ORAL
  Filled 2022-09-06: qty 1

## 2022-09-06 MED ORDER — SODIUM CHLORIDE 0.9 % IV SOLN
2.0000 g | Freq: Once | INTRAVENOUS | Status: AC
  Administered 2022-09-06: 2 g via INTRAVENOUS
  Filled 2022-09-06: qty 12.5

## 2022-09-06 MED ORDER — POLYETHYLENE GLYCOL 3350 17 G PO PACK
17.0000 g | PACK | Freq: Every day | ORAL | Status: DC | PRN
  Administered 2022-09-06: 17 g via ORAL
  Filled 2022-09-06: qty 1

## 2022-09-06 MED ORDER — MAGNESIUM SULFATE 2 GM/50ML IV SOLN
2.0000 g | Freq: Once | INTRAVENOUS | Status: AC
  Administered 2022-09-06: 2 g via INTRAVENOUS
  Filled 2022-09-06: qty 50

## 2022-09-06 MED ORDER — TRAZODONE HCL 50 MG PO TABS
50.0000 mg | ORAL_TABLET | Freq: Every evening | ORAL | Status: DC | PRN
  Administered 2022-09-06: 50 mg via ORAL
  Filled 2022-09-06: qty 1

## 2022-09-06 MED ORDER — ENOXAPARIN SODIUM 40 MG/0.4ML IJ SOSY
40.0000 mg | PREFILLED_SYRINGE | INTRAMUSCULAR | Status: DC
  Administered 2022-09-06 – 2022-09-08 (×3): 40 mg via SUBCUTANEOUS
  Filled 2022-09-06 (×3): qty 0.4

## 2022-09-06 MED ORDER — DOCUSATE SODIUM 100 MG PO CAPS
100.0000 mg | ORAL_CAPSULE | Freq: Two times a day (BID) | ORAL | Status: DC | PRN
  Administered 2022-09-06: 100 mg via ORAL
  Filled 2022-09-06: qty 1

## 2022-09-06 MED ORDER — CHLORHEXIDINE GLUCONATE CLOTH 2 % EX PADS
6.0000 | MEDICATED_PAD | Freq: Every day | CUTANEOUS | Status: DC
  Administered 2022-09-07 – 2022-09-08 (×2): 6 via TOPICAL

## 2022-09-06 MED ORDER — SODIUM CHLORIDE 0.9 % IV SOLN
500.0000 mg | Freq: Once | INTRAVENOUS | Status: AC
  Administered 2022-09-06: 500 mg via INTRAVENOUS
  Filled 2022-09-06: qty 5

## 2022-09-06 MED ORDER — NOREPINEPHRINE 4 MG/250ML-% IV SOLN
0.0000 ug/min | INTRAVENOUS | Status: DC
  Administered 2022-09-06: 10 ug/min via INTRAVENOUS
  Administered 2022-09-06: 4 ug/min via INTRAVENOUS

## 2022-09-06 MED ORDER — SODIUM CHLORIDE 0.9 % IV SOLN: 2.0000 g | Freq: Once | INTRAVENOUS | Status: DC

## 2022-09-06 MED ORDER — NOREPINEPHRINE 4 MG/250ML-% IV SOLN: 2.0000 ug/min | INTRAVENOUS | Status: DC

## 2022-09-06 MED ORDER — LACTATED RINGERS IV BOLUS
1000.0000 mL | Freq: Once | INTRAVENOUS | Status: AC
  Administered 2022-09-06: 1000 mL via INTRAVENOUS

## 2022-09-06 MED ORDER — SODIUM CHLORIDE 0.9 % IV BOLUS
500.0000 mL | Freq: Once | INTRAVENOUS | Status: AC
  Administered 2022-09-06: 500 mL via INTRAVENOUS

## 2022-09-06 NOTE — ED Notes (Signed)
Turned off levofed, verbal order from Dr Starleen Blue.  Provided several more warm blankets for pt.

## 2022-09-06 NOTE — ED Notes (Signed)
EDP at bedside. BP still reading low. Moved BP cuff to other arm. Pt is alert.

## 2022-09-06 NOTE — ED Notes (Signed)
Restartee levo per Dr Starleen Blue at 41mg/min. Last BP 96/50 with MAP of 65.

## 2022-09-06 NOTE — ED Notes (Signed)
RN to bedside to introduce self to pt and initiate levo drip per MD.

## 2022-09-06 NOTE — ED Notes (Signed)
Advised nurse that patient has ready bed 

## 2022-09-06 NOTE — Progress Notes (Signed)
IV RN received auto-generated order for ultrasound guided iv for vasopressors;  spoke with pt's RN, Darrick Penna, who verified that pt has 3 ivs currently, and Levo is being stopped; a new consult will be placed to IV Team if needed.
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
## 2022-09-06 NOTE — Progress Notes (Signed)
PHARMACY CONSULT NOTE - FOLLOW UP  Pharmacy Consult for Electrolyte Monitoring and Replacement   Recent Labs: Potassium (mmol/L)  Date Value  09/06/2022 3.5   Magnesium (mg/dL)  Date Value  08/28/2022 1.8   Calcium (mg/dL)  Date Value  09/06/2022 7.8 (L)   Albumin (g/dL)  Date Value  09/06/2022 2.6 (L)   Phosphorus (mg/dL)  Date Value  08/28/2022 3.2   Sodium (mmol/L)  Date Value  09/06/2022 134 (L)     Assessment: Potassium 3.5  Calcium 7.8 (corrected calcium 8.92)  Magnesium 1.8 Phosphorus 3.2 (from 1/26)  Goal of Therapy:  Electrolytes WNL  Plan:  Potassium 20 mEq po x 1 Magnesium 2 gm IV x 1 Order CMP, Mag and Phos for 2/5 AM  Alison Murray ,PharmD Clinical Pharmacist 09/06/2022 4:24 PM

## 2022-09-06 NOTE — ED Notes (Signed)
Report called to ICU [facility].

## 2022-09-06 NOTE — ED Notes (Signed)
Informed Dr Starleen Blue of low BP. Bolus started and second nurse obtaining cultures and second IV line.

## 2022-09-06 NOTE — ED Notes (Signed)
Provider at bedside to perform u/s of heart. Pt still mentating appropriately. Radial pulse palpable. Other RN getting levo.

## 2022-09-06 NOTE — ED Triage Notes (Signed)
Pt from [facility], [place] Pt has abdominal pain. Hx COPD, pacemaker, recent dx rectal cancer BP was 70/44, 18# to R forearm, no fluids given yet 99% on RA, HR 70 paced, etco2 26, clear lung sounds No cancer tx yet, has PCP appt tomorrow  Pt states has "gut pressure every day all day" to R and L lower abdomen "for a long time"  Pt also complains of L neck pain   Pt is very thin. Intercostal retractions noted but pt also does not have much adipose tissue

## 2022-09-07 ENCOUNTER — Inpatient Hospital Stay: Payer: Medicare Other | Admitting: Oncology

## 2022-09-07 DIAGNOSIS — Z87898 Personal history of other specified conditions: Secondary | ICD-10-CM

## 2022-09-07 DIAGNOSIS — C20 Malignant neoplasm of rectum: Secondary | ICD-10-CM | POA: Diagnosis not present

## 2022-09-07 DIAGNOSIS — Z515 Encounter for palliative care: Secondary | ICD-10-CM | POA: Diagnosis not present

## 2022-09-07 DIAGNOSIS — Z7189 Other specified counseling: Secondary | ICD-10-CM

## 2022-09-07 DIAGNOSIS — Z950 Presence of cardiac pacemaker: Secondary | ICD-10-CM | POA: Insufficient documentation

## 2022-09-07 DIAGNOSIS — I5030 Unspecified diastolic (congestive) heart failure: Secondary | ICD-10-CM | POA: Insufficient documentation

## 2022-09-07 DIAGNOSIS — Z8679 Personal history of other diseases of the circulatory system: Secondary | ICD-10-CM

## 2022-09-07 DIAGNOSIS — Z8546 Personal history of malignant neoplasm of prostate: Secondary | ICD-10-CM

## 2022-09-07 DIAGNOSIS — I959 Hypotension, unspecified: Secondary | ICD-10-CM

## 2022-09-07 DIAGNOSIS — C787 Secondary malignant neoplasm of liver and intrahepatic bile duct: Secondary | ICD-10-CM | POA: Diagnosis not present

## 2022-09-07 LAB — COMPREHENSIVE METABOLIC PANEL
ALT: 16 U/L (ref 0–44)
AST: 29 U/L (ref 15–41)
Albumin: 2.4 g/dL — ABNORMAL LOW (ref 3.5–5.0)
Alkaline Phosphatase: 247 U/L — ABNORMAL HIGH (ref 38–126)
Anion gap: 8 (ref 5–15)
BUN: 11 mg/dL (ref 8–23)
CO2: 23 mmol/L (ref 22–32)
Calcium: 8 mg/dL — ABNORMAL LOW (ref 8.9–10.3)
Chloride: 104 mmol/L (ref 98–111)
Creatinine, Ser: 0.54 mg/dL — ABNORMAL LOW (ref 0.61–1.24)
GFR, Estimated: >60 mL/min (ref >60)
Glucose, Bld: 93 mg/dL (ref 70–99)
Potassium: 3.8 mmol/L (ref 3.5–5.1)
Sodium: 135 mmol/L (ref 135–145)
Total Bilirubin: 0.9 mg/dL (ref 0.3–1.2)
Total Protein: 5.3 g/dL — ABNORMAL LOW (ref 6.5–8.1)

## 2022-09-07 LAB — CBC WITH DIFFERENTIAL/PLATELET
Abs Immature Granulocytes: 0.05 10*3/uL (ref 0.00–0.07)
Basophils Absolute: 0.1 10*3/uL (ref 0.0–0.1)
Basophils Relative: 1 %
Eosinophils Absolute: 0.3 10*3/uL (ref 0.0–0.5)
Eosinophils Relative: 3 %
HCT: 27.8 % — ABNORMAL LOW (ref 39.0–52.0)
Hemoglobin: 9.1 g/dL — ABNORMAL LOW (ref 13.0–17.0)
Immature Granulocytes: 1 %
Lymphocytes Relative: 15 %
Lymphs Abs: 1.5 10*3/uL (ref 0.7–4.0)
MCH: 31.9 pg (ref 26.0–34.0)
MCHC: 32.7 g/dL (ref 30.0–36.0)
MCV: 97.5 fL (ref 80.0–100.0)
Monocytes Absolute: 1.3 10*3/uL — ABNORMAL HIGH (ref 0.1–1.0)
Monocytes Relative: 13 %
Neutro Abs: 7 10*3/uL (ref 1.7–7.7)
Neutrophils Relative %: 67 %
Platelets: 293 10*3/uL (ref 150–400)
RBC: 2.85 MIL/uL — ABNORMAL LOW (ref 4.22–5.81)
RDW: 14.4 % (ref 11.5–15.5)
WBC: 10.2 10*3/uL (ref 4.0–10.5)
nRBC: 0 % (ref 0.0–0.2)

## 2022-09-07 LAB — MAGNESIUM: Magnesium: 1.9 mg/dL (ref 1.7–2.4)

## 2022-09-07 LAB — PHOSPHORUS: Phosphorus: 2.6 mg/dL (ref 2.5–4.6)

## 2022-09-07 MED ORDER — ALBUTEROL SULFATE (2.5 MG/3ML) 0.083% IN NEBU: 2.5000 mg | INHALATION_SOLUTION | Freq: Four times a day (QID) | RESPIRATORY_TRACT | Status: DC | PRN

## 2022-09-07 MED ORDER — POLYETHYLENE GLYCOL 3350 17 G PO PACK
34.0000 g | PACK | Freq: Every day | ORAL | Status: DC
  Administered 2022-09-08 – 2022-09-09 (×2): 34 g via ORAL
  Filled 2022-09-07 (×3): qty 2

## 2022-09-07 MED ORDER — LATANOPROST 0.005 % OP SOLN
1.0000 [drp] | Freq: Every day | OPHTHALMIC | Status: DC
  Administered 2022-09-07 – 2022-09-08 (×2): 1 [drp] via OPHTHALMIC
  Filled 2022-09-07: qty 2.5

## 2022-09-07 MED ORDER — POTASSIUM CHLORIDE IN NACL 20-0.9 MEQ/L-% IV SOLN
INTRAVENOUS | Status: DC
  Filled 2022-09-07 (×5): qty 1000

## 2022-09-07 MED ORDER — BRIMONIDINE TARTRATE 0.2 % OP SOLN
1.0000 [drp] | Freq: Three times a day (TID) | OPHTHALMIC | Status: DC
  Administered 2022-09-07 – 2022-09-08 (×5): 1 [drp] via OPHTHALMIC
  Filled 2022-09-07: qty 5

## 2022-09-07 MED ORDER — ALBUTEROL SULFATE HFA 108 (90 BASE) MCG/ACT IN AERS: 1.0000 | INHALATION_SPRAY | Freq: Four times a day (QID) | RESPIRATORY_TRACT | Status: DC | PRN

## 2022-09-07 MED ORDER — BRINZOLAMIDE 1 % OP SUSP
1.0000 [drp] | Freq: Three times a day (TID) | OPHTHALMIC | Status: DC
  Administered 2022-09-07 – 2022-09-08 (×5): 1 [drp] via OPHTHALMIC
  Filled 2022-09-07: qty 10

## 2022-09-07 MED ORDER — BUSPIRONE HCL 10 MG PO TABS
5.0000 mg | ORAL_TABLET | Freq: Two times a day (BID) | ORAL | Status: DC
  Administered 2022-09-07 – 2022-09-09 (×5): 5 mg via ORAL
  Filled 2022-09-07 (×5): qty 1

## 2022-09-07 MED ORDER — SODIUM CHLORIDE 0.9 % IV SOLN
2.0000 g | Freq: Two times a day (BID) | INTRAVENOUS | Status: DC
  Administered 2022-09-07 – 2022-09-08 (×2): 2 g via INTRAVENOUS
  Filled 2022-09-07: qty 12.5
  Filled 2022-09-07: qty 2
  Filled 2022-09-07: qty 12.5

## 2022-09-07 MED ORDER — KETOROLAC TROMETHAMINE 30 MG/ML IJ SOLN
30.0000 mg | Freq: Once | INTRAMUSCULAR | Status: AC
  Administered 2022-09-07: 30 mg via INTRAVENOUS
  Filled 2022-09-07: qty 1
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
## 2022-09-07 NOTE — Progress Notes (Signed)
Report called to [facility]. Patient alert and on room air. Pacemaker.Purwick intact, BM 2/4, patient tolerating diet. Patient being tx to room 101. Patients wife at bedside. Continue to assess.

## 2022-09-07 NOTE — Progress Notes (Signed)
   09/07/22 1500  Spiritual Encounters  Type of Visit Initial  Care provided to: Pt and family  Referral source Nurse (RN/NT/LPN)  Reason for visit Advance directives  OnCall Visit Yes  Spiritual Framework  Patient Stress Factors None identified  Family Stress Factors None identified  Spiritual Care Plan  Spiritual Care Issues Still Outstanding Chaplain will continue to follow   Spoke with patient about Advance directives give to patient and spouse they are looking over it. Advise them that when they are ready a chaplain will come back to help them with the process.
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
## 2022-09-07 NOTE — Progress Notes (Signed)
Nutrition Brief Note  Chart reviewed. Per oncology palliative care, pt is nearing end of life and is transitioning towards comfort care. Awaiting for wife to come in prior to officially transitioning.  No further nutrition interventions planned at this time.  Please re-consult as needed.   Loistine Chance, RD, LDN, [place] Registered Dietitian II Certified Diabetes Care and Education Specialist Please refer to [facility] for RD and/or RD on-call/weekend/after hours pager
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
## 2022-09-07 NOTE — Progress Notes (Signed)
Pharmacy Antibiotic Note  Todd Webster. is a 82 y.o. male w/ PMH of HLD, glaucoma, anxiety, Stage IV rectal cancer, prostate cancer, CHF, COPD  and recent diagnosis of metastatic stage 4 rectal cancer admitted on 09/06/2022 with hypotension requiring ICU admission and brief course of pressors, now hemodynamically stable off pressors.  Pharmacy has been consulted for cefepime dosing.  Plan: start cefepime 2 grams IV every 12 hours --follow renal function for needed dose adjustments  Height: 6' (182.9 cm) Weight: 54.5 kg (120 lb 2.4 oz) IBW/kg (Calculated) : 77.6  Temp (24hrs), Avg:97.9 F (36.6 C), Min:97.3 F (36.3 C), Max:98.2 F (36.8 C)  Recent Labs  Lab 09/06/22 1156 09/06/22 1211 09/06/22 1803 09/07/22 0354 09/07/22 0641  WBC 9.7  --   --   --  10.2  CREATININE 0.57*  --   --  0.54*  --   LATICACIDVEN  --  1.8 1.9  --   --     Estimated Creatinine Clearance: 56.8 mL/min (A) (by C-G formula based on SCr of 0.54 mg/dL (L)).    Allergies  Allergen Reactions   Grifulvin V [Griseofulvin] Other (See Comments)    Reaction: possible blood in urine    Antimicrobials this admission: 02/05 cefepime >>   Microbiology results: 02/04 BCx: NGTD 02/04 MRSA PCR: negative  Thank you for allowing pharmacy to be a part of this patient's care.  Dallie Piles 09/07/2022 11:33 AM

## 2022-09-08 DIAGNOSIS — I959 Hypotension, unspecified: Secondary | ICD-10-CM | POA: Diagnosis not present

## 2022-09-08 DIAGNOSIS — Z515 Encounter for palliative care: Secondary | ICD-10-CM | POA: Diagnosis not present

## 2022-09-08 LAB — COMPREHENSIVE METABOLIC PANEL
ALT: 19 U/L (ref 0–44)
AST: 34 U/L (ref 15–41)
Albumin: 2.6 g/dL — ABNORMAL LOW (ref 3.5–5.0)
Alkaline Phosphatase: 318 U/L — ABNORMAL HIGH (ref 38–126)
Anion gap: 8 (ref 5–15)
BUN: 13 mg/dL (ref 8–23)
CO2: 23 mmol/L (ref 22–32)
Calcium: 7.9 mg/dL — ABNORMAL LOW (ref 8.9–10.3)
Chloride: 104 mmol/L (ref 98–111)
Creatinine, Ser: 0.57 mg/dL — ABNORMAL LOW (ref 0.61–1.24)
GFR, Estimated: >60 mL/min (ref >60)
Glucose, Bld: 117 mg/dL — ABNORMAL HIGH (ref 70–99)
Potassium: 4.2 mmol/L (ref 3.5–5.1)
Sodium: 135 mmol/L (ref 135–145)
Total Bilirubin: 0.8 mg/dL (ref 0.3–1.2)
Total Protein: 5.7 g/dL — ABNORMAL LOW (ref 6.5–8.1)

## 2022-09-08 LAB — CBC
HCT: 30.4 % — ABNORMAL LOW (ref 39.0–52.0)
Hemoglobin: 10.1 g/dL — ABNORMAL LOW (ref 13.0–17.0)
MCH: 32.9 pg (ref 26.0–34.0)
MCHC: 33.2 g/dL (ref 30.0–36.0)
MCV: 99 fL (ref 80.0–100.0)
Platelets: 363 10*3/uL (ref 150–400)
RBC: 3.07 MIL/uL — ABNORMAL LOW (ref 4.22–5.81)
RDW: 14.6 % (ref 11.5–15.5)
WBC: 11.9 10*3/uL — ABNORMAL HIGH (ref 4.0–10.5)
nRBC: 0 % (ref 0.0–0.2)
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
## 2022-09-08 NOTE — TOC Initial Note (Signed)
Transition of Care (TOC) - Initial/Assessment Note    Patient Details  Name: Todd LITTLES Sr. MRN: [id number] Date of Birth: 09-14-1940  Transition of Care [facility]) CM/SW Contact:    Gerilyn Pilgrim, LCSW Phone Number: 09/08/2022, 10:29 AM  Clinical Narrative:  TOC following, pt agreeable to hospice services. Palliative to meet with pt and family today. TOC will follow up once meeting has taken place.                         Patient Goals and CMS Choice            Expected Discharge Plan and Services                                              Prior Living Arrangements/Services                       Activities of Daily Living Home Assistive Devices/Equipment: Environmental consultant (specify type), Cane (specify quad or straight) ADL Screening (condition at time of admission) Patient's cognitive ability adequate to safely complete daily activities?: Yes Is the patient deaf or have difficulty hearing?: Yes Does the patient have difficulty seeing, even when wearing glasses/contacts?: Yes Does the patient have difficulty concentrating, remembering, or making decisions?: Yes Patient able to express need for assistance with ADLs?: Yes Does the patient have difficulty dressing or bathing?: No Independently performs ADLs?: No Does the patient have difficulty walking or climbing stairs?: Yes Weakness of Legs: Both Weakness of Arms/Hands: Left  Permission Sought/Granted                  Emotional Assessment              Admission diagnosis:  Hypotension [I95.9] Hypotension, unspecified hypotension type [I95.9] Patient Active Problem List   Diagnosis Date Noted   (HFpEF) heart failure with preserved ejection fraction ([place]) 09/07/2022   History of prostate cancer 09/07/2022   History of heart block 09/07/2022   Status cardiac pacemaker 09/07/2022   History of bacteremia 09/07/2022   Goals of care, counseling/discussion 09/07/2022   Hypotension 08/28/2022    Hyperlipidemia 08/28/2022   Depression 08/28/2022   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 ([place]) 08/17/2022   Pelvic pain 08/17/2022   Gram-positive cocci bacteremia    Acute dehydration 07/31/2021   COVID-19 virus infection 07/31/2021   COPD (chronic obstructive pulmonary disease) ([place]) 07/31/2021   Acute on chronic combined systolic and diastolic CHF (congestive heart failure) ([place]) 07/31/2021   Dehydration, moderate 07/31/2021   Moderate protein-calorie malnutrition ([place]) 08/15/2019   Protein-calorie malnutrition, severe 08/15/2019   Hypoalbuminemia due to protein-calorie malnutrition ([place]) 08/15/2019   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[place]) 08/14/2019   Exertional chest pain 08/14/2019   Nicotine dependence 08/14/2019   Osteomyelitis, 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   PCP:  Jodi Marble, MD Pharmacy:   [id number] [id number]: [phone]: [phone]    Social Determinants of Health (SDOH) Social History: SDOH Screenings   Food Insecurity: Food Insecurity Present (09/06/2022)  Housing: High Risk (09/06/2022)  Transportation Needs: No Transportation Needs (09/06/2022)  Utilities: Not At Risk (08/28/2022)  Depression (PHQ2-9): Low Risk  (05/25/2022)  Financial Resource Strain: Medium Risk (06/11/2022)  Tobacco Use: Medium Risk (09/06/2022)  SDOH Interventions:     Readmission Risk Interventions     No data to display

## 2022-09-08 NOTE — TOC Progression Note (Signed)
Transition of Care (TOC) - Progression Note    Patient Details  Name: Todd MUTCH Sr. MRN: [id number] Date of Birth: [birth year]  [facility]) CM/SW Contact  Gerilyn Pilgrim, LCSW Phone Number: 09/08/2022, 3:06 PM  Clinical Narrative:   SW spoke with husband and wife regarding hospice options, list provided to both patient and wife. Wife and patient would like to use [facility] hospice. [facility] notified.          Expected Discharge Plan and Services                                               Social Determinants of Health (SDOH) Interventions SDOH Screenings   Food Insecurity: Food Insecurity Present (09/06/2022)  Housing: High Risk (09/06/2022)  Transportation Needs: No Transportation Needs (09/06/2022)  Utilities: Not At Risk (08/28/2022)  Depression (PHQ2-9): Low Risk  (05/25/2022)  Financial Resource Strain: Medium Risk (06/11/2022)  Tobacco Use: Medium Risk (09/06/2022)    Readmission Risk Interventions     No data to display

## 2022-09-09 DIAGNOSIS — C20 Malignant neoplasm of rectum: Secondary | ICD-10-CM | POA: Diagnosis not present

## 2022-09-09 DIAGNOSIS — I5033 Acute on chronic diastolic (congestive) heart failure: Secondary | ICD-10-CM | POA: Diagnosis not present

## 2022-09-09 DIAGNOSIS — C787 Secondary malignant neoplasm of liver and intrahepatic bile duct: Secondary | ICD-10-CM | POA: Diagnosis not present

## 2022-09-09 DIAGNOSIS — R571 Hypovolemic shock: Principal | ICD-10-CM

## 2022-09-09 LAB — CBC
HCT: 29.4 % — ABNORMAL LOW (ref 39.0–52.0)
Hemoglobin: 9.9 g/dL — ABNORMAL LOW (ref 13.0–17.0)
MCH: 33.1 pg (ref 26.0–34.0)
MCHC: 33.7 g/dL (ref 30.0–36.0)
MCV: 98.3 fL (ref 80.0–100.0)
Platelets: 328 10*3/uL (ref 150–400)
RBC: 2.99 MIL/uL — ABNORMAL LOW (ref 4.22–5.81)
RDW: 14.5 % (ref 11.5–15.5)
WBC: 10.6 10*3/uL — ABNORMAL HIGH (ref 4.0–10.5)
nRBC: 0 % (ref 0.0–0.2)

## 2022-09-09 LAB — COMPREHENSIVE METABOLIC PANEL
ALT: 17 U/L (ref 0–44)
AST: 31 U/L (ref 15–41)
Albumin: 2.3 g/dL — ABNORMAL LOW (ref 3.5–5.0)
Alkaline Phosphatase: 261 U/L — ABNORMAL HIGH (ref 38–126)
Anion gap: 6 (ref 5–15)
BUN: 9 mg/dL (ref 8–23)
CO2: 24 mmol/L (ref 22–32)
Calcium: 7.9 mg/dL — ABNORMAL LOW (ref 8.9–10.3)
Chloride: 102 mmol/L (ref 98–111)
Creatinine, Ser: 0.5 mg/dL — ABNORMAL LOW (ref 0.61–1.24)
GFR, Estimated: >60 mL/min (ref >60)
Glucose, Bld: 97 mg/dL (ref 70–99)
Potassium: 4.2 mmol/L (ref 3.5–5.1)
Sodium: 132 mmol/L — ABNORMAL LOW (ref 135–145)
Total Bilirubin: 1 mg/dL (ref 0.3–1.2)
Total Protein: 5.4 g/dL — ABNORMAL LOW (ref 6.5–8.1)

## 2022-09-09 MED ORDER — TRAMADOL HCL 50 MG PO TABS: 50.0000 mg | ORAL_TABLET | Freq: Four times a day (QID) | ORAL | 0 refills | Status: DC | PRN

## 2022-09-09 MED ORDER — TRAMADOL HCL 50 MG PO TABS
50.0000 mg | ORAL_TABLET | Freq: Once | ORAL | Status: AC
  Administered 2022-09-09: 50 mg via ORAL
  Filled 2022-09-09: qty 1

## 2022-09-09 NOTE — Plan of Care (Signed)
Patient is adequate for discharge to home environment with spouse.  Plan for Hospice to follow care.

## 2022-09-09 NOTE — Care Management Important Message (Signed)
Important Message  Patient Details  Name: Todd BEAVERS Sr. MRN: [id number] Date of Birth: [birth year]   Medicare Important Message Given:  Other (see comment)  Disposition to discharge with hospice services.  Medicare IM withheld at this time out of respect for patient and family.      Dannette Barbara 09/09/2022, 8:23 AM
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
## 2022-09-09 NOTE — Progress Notes (Signed)
Manufacturing systems engineer Liaison Note  Patient set for discharge home today.  I spoke with Mr. Serviss and his wife today to discuss discharge home.  Mrs. Vestal was not at the bedside today.  I spoke with her via phone.  All are in agreement with discharge and accept a 1500 admission visit today with [facility].  Collaboration with hospital team to ensure patient will be home for that visit.  Patient will discharge via EMS.  EMS arranged by [facility] staff.  Discharge summary sent to referral intake. Coordination with MD to ensure patient has prescription for Toradol for pain at discharge.  Thank you for allowing participation in this patient's care.  Will continue to follow through hospital discharge.  Dimas Aguas, RN Nurse Liaison [phone]

## 2022-09-09 NOTE — TOC Transition Note (Signed)
Transition of Care [facility]) - CM/SW Discharge Note   Patient Details  Name: SEBASTAIN FISHBAUGH Sr. MRN: [id number] Date of Birth: [birth year]  [facility] CM/SW Contact:  Gerilyn Pilgrim, LCSW Phone Number: 09/09/2022, 9:46 AM   Clinical Narrative:   Pt has orders to discharge home with home hospice. Medical necessity forms printed to unit. CSW will call [facility] once RN notifies. [facility] able to admit patient today.     Final next level of care: Home w Hospice Care Barriers to Discharge: Barriers Resolved   Patient Goals and CMS Choice CMS Medicare.gov Compare Post Acute Care list provided to:: Patient Choice offered to / list presented to : Patient  Discharge Placement                  Patient to be transferred to facility by: [facility]   Patient and family notified of of transfer: 09/09/22  Discharge Plan and Services Additional resources added to the After Visit Summary for                                       Social Determinants of Health (SDOH) Interventions SDOH Screenings   Food Insecurity: Food Insecurity Present (09/06/2022)  Housing: High Risk (09/06/2022)  Transportation Needs: No Transportation Needs (09/06/2022)  Utilities: Not At Risk (08/28/2022)  Depression (PHQ2-9): Low Risk  (05/25/2022)  Financial Resource Strain: Medium Risk (06/11/2022)  Tobacco Use: Medium Risk (09/06/2022)     Readmission Risk Interventions     No data to display

## 2022-09-11 LAB — CULTURE, BLOOD (ROUTINE X 2)
Culture: NO GROWTH
Culture: NO GROWTH
Special Requests: ADEQUATE
Special Requests: ADEQUATE

## 2022-10-02 DEATH — deceased

## 2022-10-08 ENCOUNTER — Telehealth: Payer: Self-pay | Admitting: *Deleted

## 2022-10-08 NOTE — Telephone Encounter (Signed)
I got a message from the wife and the pt. Passed away and she has to have information abbout giving it to [place] so she can keep benefits for wife. I called her and said that he did have metastatic cancer but none of the testing they checked did not give what was the primary cancer type. Some of the stains indicated that it could have been Gi cancer. I printed his pathology and put it in the mail for her

## 2022-10-22 ENCOUNTER — Ambulatory Visit: Payer: Self-pay | Admitting: Cardiovascular Disease
